# Patient Record
Sex: Female | Born: 1975
Health system: Southern US, Community
[De-identification: ages and names within clinical notes are randomized; demographics above are authoritative.]

## PROBLEM LIST (undated history)

## (undated) DIAGNOSIS — M1990 Unspecified osteoarthritis, unspecified site: Secondary | ICD-10-CM

## (undated) DIAGNOSIS — N289 Disorder of kidney and ureter, unspecified: Secondary | ICD-10-CM

## (undated) DIAGNOSIS — I2510 Atherosclerotic heart disease of native coronary artery without angina pectoris: Secondary | ICD-10-CM

## (undated) DIAGNOSIS — K589 Irritable bowel syndrome without diarrhea: Secondary | ICD-10-CM

## (undated) DIAGNOSIS — Z8739 Personal history of other diseases of the musculoskeletal system and connective tissue: Secondary | ICD-10-CM

## (undated) DIAGNOSIS — M5430 Sciatica, unspecified side: Secondary | ICD-10-CM

## (undated) DIAGNOSIS — M797 Fibromyalgia: Secondary | ICD-10-CM

## (undated) DIAGNOSIS — R42 Dizziness and giddiness: Secondary | ICD-10-CM

## (undated) DIAGNOSIS — K760 Fatty (change of) liver, not elsewhere classified: Secondary | ICD-10-CM

## (undated) HISTORY — PX: TUBAL LIGATION: SHX77

## (undated) HISTORY — DX: Irritable bowel syndrome without diarrhea: K58.9

## (undated) HISTORY — PX: CHOLECYSTECTOMY: SHX55

## (undated) HISTORY — DX: Fatty (change of) liver, not elsewhere classified: K76.0

## (undated) HISTORY — DX: Dizziness and giddiness: R42

## (undated) HISTORY — PX: ABDOMINAL HYSTERECTOMY: SHX81

## (undated) HISTORY — DX: Atherosclerotic heart disease of native coronary artery without angina pectoris: I25.10

---

## 1998-07-01 ENCOUNTER — Emergency Department (HOSPITAL_COMMUNITY): Admission: EM | Admit: 1998-07-01 | Discharge: 1998-07-01 | Payer: Self-pay | Admitting: Emergency Medicine

## 1998-07-17 ENCOUNTER — Emergency Department (HOSPITAL_COMMUNITY): Admission: EM | Admit: 1998-07-17 | Discharge: 1998-07-17 | Payer: Self-pay | Admitting: Emergency Medicine

## 1998-08-02 ENCOUNTER — Emergency Department (HOSPITAL_COMMUNITY): Admission: EM | Admit: 1998-08-02 | Discharge: 1998-08-02 | Payer: Self-pay | Admitting: Emergency Medicine

## 1998-10-04 ENCOUNTER — Emergency Department (HOSPITAL_COMMUNITY): Admission: EM | Admit: 1998-10-04 | Discharge: 1998-10-04 | Payer: Self-pay | Admitting: Emergency Medicine

## 1998-10-09 ENCOUNTER — Emergency Department (HOSPITAL_COMMUNITY): Admission: EM | Admit: 1998-10-09 | Discharge: 1998-10-09 | Payer: Self-pay | Admitting: Emergency Medicine

## 1998-10-22 ENCOUNTER — Other Ambulatory Visit: Admission: RE | Admit: 1998-10-22 | Discharge: 1998-10-22 | Payer: Self-pay | Admitting: Gastroenterology

## 1998-10-25 ENCOUNTER — Ambulatory Visit (HOSPITAL_COMMUNITY): Admission: RE | Admit: 1998-10-25 | Discharge: 1998-10-25 | Payer: Self-pay | Admitting: Gastroenterology

## 1998-10-25 ENCOUNTER — Encounter: Payer: Self-pay | Admitting: Gastroenterology

## 1999-01-05 ENCOUNTER — Emergency Department (HOSPITAL_COMMUNITY): Admission: EM | Admit: 1999-01-05 | Discharge: 1999-01-05 | Payer: Self-pay | Admitting: Emergency Medicine

## 1999-01-13 ENCOUNTER — Other Ambulatory Visit: Admission: RE | Admit: 1999-01-13 | Discharge: 1999-01-13 | Payer: Self-pay | Admitting: Obstetrics

## 1999-03-04 ENCOUNTER — Emergency Department (HOSPITAL_COMMUNITY): Admission: EM | Admit: 1999-03-04 | Discharge: 1999-03-04 | Payer: Self-pay | Admitting: Emergency Medicine

## 1999-04-22 ENCOUNTER — Inpatient Hospital Stay (HOSPITAL_COMMUNITY): Admission: AD | Admit: 1999-04-22 | Discharge: 1999-04-22 | Payer: Self-pay | Admitting: Obstetrics

## 1999-07-14 ENCOUNTER — Inpatient Hospital Stay (HOSPITAL_COMMUNITY): Admission: AD | Admit: 1999-07-14 | Discharge: 1999-07-14 | Payer: Self-pay | Admitting: Obstetrics

## 1999-07-15 ENCOUNTER — Inpatient Hospital Stay (HOSPITAL_COMMUNITY): Admission: AD | Admit: 1999-07-15 | Discharge: 1999-07-15 | Payer: Self-pay | Admitting: Obstetrics

## 1999-08-02 ENCOUNTER — Inpatient Hospital Stay (HOSPITAL_COMMUNITY): Admission: AD | Admit: 1999-08-02 | Discharge: 1999-08-02 | Payer: Self-pay | Admitting: Obstetrics

## 1999-08-11 ENCOUNTER — Inpatient Hospital Stay (HOSPITAL_COMMUNITY): Admission: AD | Admit: 1999-08-11 | Discharge: 1999-08-11 | Payer: Self-pay | Admitting: Obstetrics

## 1999-08-16 ENCOUNTER — Inpatient Hospital Stay (HOSPITAL_COMMUNITY): Admission: AD | Admit: 1999-08-16 | Discharge: 1999-08-16 | Payer: Self-pay | Admitting: Obstetrics

## 1999-08-17 ENCOUNTER — Inpatient Hospital Stay (HOSPITAL_COMMUNITY): Admission: AD | Admit: 1999-08-17 | Discharge: 1999-08-17 | Payer: Self-pay | Admitting: Obstetrics

## 1999-08-19 ENCOUNTER — Inpatient Hospital Stay (HOSPITAL_COMMUNITY): Admission: AD | Admit: 1999-08-19 | Discharge: 1999-08-19 | Payer: Self-pay | Admitting: Obstetrics

## 1999-08-21 ENCOUNTER — Inpatient Hospital Stay (HOSPITAL_COMMUNITY): Admission: AD | Admit: 1999-08-21 | Discharge: 1999-08-23 | Payer: Self-pay | Admitting: Obstetrics

## 1999-11-15 ENCOUNTER — Emergency Department (HOSPITAL_COMMUNITY): Admission: EM | Admit: 1999-11-15 | Discharge: 1999-11-15 | Payer: Self-pay | Admitting: Emergency Medicine

## 1999-11-17 ENCOUNTER — Emergency Department (HOSPITAL_COMMUNITY): Admission: EM | Admit: 1999-11-17 | Discharge: 1999-11-17 | Payer: Self-pay | Admitting: *Deleted

## 1999-12-05 ENCOUNTER — Other Ambulatory Visit: Admission: RE | Admit: 1999-12-05 | Discharge: 1999-12-05 | Payer: Self-pay | Admitting: Obstetrics

## 2000-01-04 ENCOUNTER — Ambulatory Visit (HOSPITAL_COMMUNITY): Admission: RE | Admit: 2000-01-04 | Discharge: 2000-01-04 | Payer: Self-pay | Admitting: Obstetrics

## 2000-04-26 ENCOUNTER — Emergency Department (HOSPITAL_COMMUNITY): Admission: EM | Admit: 2000-04-26 | Discharge: 2000-04-26 | Payer: Self-pay | Admitting: Emergency Medicine

## 2000-05-09 ENCOUNTER — Encounter: Admission: RE | Admit: 2000-05-09 | Discharge: 2000-05-09 | Payer: Self-pay | Admitting: Family Medicine

## 2000-05-09 ENCOUNTER — Encounter: Payer: Self-pay | Admitting: Family Medicine

## 2000-06-28 ENCOUNTER — Encounter (INDEPENDENT_AMBULATORY_CARE_PROVIDER_SITE_OTHER): Payer: Self-pay | Admitting: Specialist

## 2000-06-28 ENCOUNTER — Ambulatory Visit (HOSPITAL_COMMUNITY): Admission: RE | Admit: 2000-06-28 | Discharge: 2000-06-28 | Payer: Self-pay | Admitting: *Deleted

## 2000-11-16 ENCOUNTER — Emergency Department (HOSPITAL_COMMUNITY): Admission: EM | Admit: 2000-11-16 | Discharge: 2000-11-16 | Payer: Self-pay | Admitting: Emergency Medicine

## 2000-11-29 ENCOUNTER — Emergency Department (HOSPITAL_COMMUNITY): Admission: EM | Admit: 2000-11-29 | Discharge: 2000-11-30 | Payer: Self-pay | Admitting: *Deleted

## 2001-01-01 ENCOUNTER — Emergency Department (HOSPITAL_COMMUNITY): Admission: EM | Admit: 2001-01-01 | Discharge: 2001-01-01 | Payer: Self-pay | Admitting: Emergency Medicine

## 2001-02-14 ENCOUNTER — Emergency Department (HOSPITAL_COMMUNITY): Admission: EM | Admit: 2001-02-14 | Discharge: 2001-02-15 | Payer: Self-pay | Admitting: Emergency Medicine

## 2002-01-24 ENCOUNTER — Encounter: Payer: Self-pay | Admitting: Emergency Medicine

## 2002-01-24 ENCOUNTER — Emergency Department (HOSPITAL_COMMUNITY): Admission: EM | Admit: 2002-01-24 | Discharge: 2002-01-24 | Payer: Self-pay | Admitting: Emergency Medicine

## 2002-01-31 ENCOUNTER — Inpatient Hospital Stay (HOSPITAL_COMMUNITY): Admission: AD | Admit: 2002-01-31 | Discharge: 2002-01-31 | Payer: Self-pay | Admitting: *Deleted

## 2002-02-04 ENCOUNTER — Other Ambulatory Visit: Admission: RE | Admit: 2002-02-04 | Discharge: 2002-02-04 | Payer: Self-pay | Admitting: Obstetrics and Gynecology

## 2002-02-04 ENCOUNTER — Encounter: Admission: RE | Admit: 2002-02-04 | Discharge: 2002-02-04 | Payer: Self-pay | Admitting: *Deleted

## 2002-03-11 ENCOUNTER — Emergency Department (HOSPITAL_COMMUNITY): Admission: EM | Admit: 2002-03-11 | Discharge: 2002-03-11 | Payer: Self-pay | Admitting: Emergency Medicine

## 2002-03-20 ENCOUNTER — Emergency Department (HOSPITAL_COMMUNITY): Admission: EM | Admit: 2002-03-20 | Discharge: 2002-03-20 | Payer: Self-pay | Admitting: Emergency Medicine

## 2002-04-20 ENCOUNTER — Emergency Department (HOSPITAL_COMMUNITY): Admission: EM | Admit: 2002-04-20 | Discharge: 2002-04-20 | Payer: Self-pay | Admitting: Emergency Medicine

## 2002-04-20 ENCOUNTER — Encounter: Payer: Self-pay | Admitting: Emergency Medicine

## 2002-04-25 ENCOUNTER — Encounter: Payer: Self-pay | Admitting: Emergency Medicine

## 2002-04-25 ENCOUNTER — Emergency Department (HOSPITAL_COMMUNITY): Admission: EM | Admit: 2002-04-25 | Discharge: 2002-04-26 | Payer: Self-pay | Admitting: Emergency Medicine

## 2002-04-29 ENCOUNTER — Encounter: Admission: RE | Admit: 2002-04-29 | Discharge: 2002-04-29 | Payer: Self-pay | Admitting: *Deleted

## 2002-05-09 ENCOUNTER — Ambulatory Visit (HOSPITAL_COMMUNITY): Admission: RE | Admit: 2002-05-09 | Discharge: 2002-05-09 | Payer: Self-pay | Admitting: General Surgery

## 2002-05-09 ENCOUNTER — Encounter (HOSPITAL_BASED_OUTPATIENT_CLINIC_OR_DEPARTMENT_OTHER): Payer: Self-pay | Admitting: General Surgery

## 2002-05-19 ENCOUNTER — Encounter (HOSPITAL_BASED_OUTPATIENT_CLINIC_OR_DEPARTMENT_OTHER): Payer: Self-pay | Admitting: General Surgery

## 2002-05-19 ENCOUNTER — Ambulatory Visit (HOSPITAL_COMMUNITY): Admission: RE | Admit: 2002-05-19 | Discharge: 2002-05-19 | Payer: Self-pay | Admitting: General Surgery

## 2002-05-21 ENCOUNTER — Ambulatory Visit (HOSPITAL_COMMUNITY): Admission: RE | Admit: 2002-05-21 | Discharge: 2002-05-21 | Payer: Self-pay | Admitting: General Surgery

## 2002-05-21 ENCOUNTER — Encounter (HOSPITAL_BASED_OUTPATIENT_CLINIC_OR_DEPARTMENT_OTHER): Payer: Self-pay | Admitting: General Surgery

## 2002-06-26 ENCOUNTER — Emergency Department (HOSPITAL_COMMUNITY): Admission: EM | Admit: 2002-06-26 | Discharge: 2002-06-26 | Payer: Self-pay

## 2002-07-11 ENCOUNTER — Inpatient Hospital Stay (HOSPITAL_COMMUNITY): Admission: AD | Admit: 2002-07-11 | Discharge: 2002-07-11 | Payer: Self-pay | Admitting: *Deleted

## 2002-07-11 ENCOUNTER — Encounter: Payer: Self-pay | Admitting: *Deleted

## 2002-07-17 ENCOUNTER — Encounter: Admission: RE | Admit: 2002-07-17 | Discharge: 2002-07-17 | Payer: Self-pay | Admitting: Obstetrics and Gynecology

## 2002-07-21 ENCOUNTER — Ambulatory Visit (HOSPITAL_COMMUNITY): Admission: RE | Admit: 2002-07-21 | Discharge: 2002-07-21 | Payer: Self-pay | Admitting: Obstetrics and Gynecology

## 2002-07-21 ENCOUNTER — Encounter: Payer: Self-pay | Admitting: Obstetrics and Gynecology

## 2003-10-10 DIAGNOSIS — K589 Irritable bowel syndrome without diarrhea: Secondary | ICD-10-CM

## 2003-10-10 HISTORY — DX: Irritable bowel syndrome, unspecified: K58.9

## 2004-06-08 ENCOUNTER — Inpatient Hospital Stay (HOSPITAL_COMMUNITY): Admission: AD | Admit: 2004-06-08 | Discharge: 2004-06-08 | Payer: Self-pay | Admitting: Obstetrics and Gynecology

## 2004-06-10 ENCOUNTER — Inpatient Hospital Stay (HOSPITAL_COMMUNITY): Admission: AD | Admit: 2004-06-10 | Discharge: 2004-06-10 | Payer: Self-pay | Admitting: Family Medicine

## 2004-06-29 ENCOUNTER — Emergency Department (HOSPITAL_COMMUNITY): Admission: EM | Admit: 2004-06-29 | Discharge: 2004-06-29 | Payer: Self-pay | Admitting: *Deleted

## 2004-10-25 ENCOUNTER — Emergency Department (HOSPITAL_COMMUNITY): Admission: EM | Admit: 2004-10-25 | Discharge: 2004-10-25 | Payer: Self-pay | Admitting: Emergency Medicine

## 2005-02-12 ENCOUNTER — Emergency Department (HOSPITAL_COMMUNITY): Admission: EM | Admit: 2005-02-12 | Discharge: 2005-02-12 | Payer: Self-pay | Admitting: Emergency Medicine

## 2005-02-20 ENCOUNTER — Inpatient Hospital Stay (HOSPITAL_COMMUNITY): Admission: AD | Admit: 2005-02-20 | Discharge: 2005-02-20 | Payer: Self-pay | Admitting: *Deleted

## 2005-04-06 ENCOUNTER — Emergency Department (HOSPITAL_COMMUNITY): Admission: EM | Admit: 2005-04-06 | Discharge: 2005-04-06 | Payer: Self-pay | Admitting: Emergency Medicine

## 2005-04-26 ENCOUNTER — Encounter (INDEPENDENT_AMBULATORY_CARE_PROVIDER_SITE_OTHER): Payer: Self-pay | Admitting: *Deleted

## 2005-04-26 ENCOUNTER — Ambulatory Visit (HOSPITAL_COMMUNITY): Admission: RE | Admit: 2005-04-26 | Discharge: 2005-04-26 | Payer: Self-pay | Admitting: Obstetrics

## 2005-06-06 ENCOUNTER — Emergency Department (HOSPITAL_COMMUNITY): Admission: EM | Admit: 2005-06-06 | Discharge: 2005-06-06 | Payer: Self-pay | Admitting: *Deleted

## 2005-07-17 ENCOUNTER — Emergency Department (HOSPITAL_COMMUNITY): Admission: EM | Admit: 2005-07-17 | Discharge: 2005-07-18 | Payer: Self-pay | Admitting: Emergency Medicine

## 2005-08-15 ENCOUNTER — Emergency Department (HOSPITAL_COMMUNITY): Admission: EM | Admit: 2005-08-15 | Discharge: 2005-08-15 | Payer: Self-pay | Admitting: Emergency Medicine

## 2005-08-25 ENCOUNTER — Encounter: Admission: RE | Admit: 2005-08-25 | Discharge: 2005-09-21 | Payer: Self-pay | Admitting: Orthopedic Surgery

## 2005-09-02 ENCOUNTER — Emergency Department (HOSPITAL_COMMUNITY): Admission: EM | Admit: 2005-09-02 | Discharge: 2005-09-02 | Payer: Self-pay | Admitting: Emergency Medicine

## 2006-01-15 ENCOUNTER — Emergency Department (HOSPITAL_COMMUNITY): Admission: EM | Admit: 2006-01-15 | Discharge: 2006-01-15 | Payer: Self-pay | Admitting: Family Medicine

## 2006-01-23 ENCOUNTER — Emergency Department (HOSPITAL_COMMUNITY): Admission: EM | Admit: 2006-01-23 | Discharge: 2006-01-23 | Payer: Self-pay | Admitting: Family Medicine

## 2006-05-05 ENCOUNTER — Emergency Department (HOSPITAL_COMMUNITY): Admission: EM | Admit: 2006-05-05 | Discharge: 2006-05-05 | Payer: Self-pay | Admitting: Emergency Medicine

## 2006-05-20 ENCOUNTER — Emergency Department (HOSPITAL_COMMUNITY): Admission: EM | Admit: 2006-05-20 | Discharge: 2006-05-21 | Payer: Self-pay | Admitting: Emergency Medicine

## 2006-08-16 ENCOUNTER — Emergency Department (HOSPITAL_COMMUNITY): Admission: EM | Admit: 2006-08-16 | Discharge: 2006-08-16 | Payer: Self-pay | Admitting: Family Medicine

## 2006-10-20 ENCOUNTER — Emergency Department: Payer: Self-pay | Admitting: Emergency Medicine

## 2006-11-04 ENCOUNTER — Emergency Department (HOSPITAL_COMMUNITY): Admission: EM | Admit: 2006-11-04 | Discharge: 2006-11-05 | Payer: Self-pay | Admitting: Emergency Medicine

## 2007-01-16 ENCOUNTER — Emergency Department (HOSPITAL_COMMUNITY): Admission: EM | Admit: 2007-01-16 | Discharge: 2007-01-16 | Payer: Self-pay | Admitting: Emergency Medicine

## 2007-02-05 ENCOUNTER — Emergency Department (HOSPITAL_COMMUNITY): Admission: EM | Admit: 2007-02-05 | Discharge: 2007-02-05 | Payer: Self-pay | Admitting: Emergency Medicine

## 2007-03-18 ENCOUNTER — Encounter: Admission: RE | Admit: 2007-03-18 | Discharge: 2007-04-10 | Payer: Self-pay | Admitting: Family Medicine

## 2007-03-19 ENCOUNTER — Encounter: Admission: RE | Admit: 2007-03-19 | Discharge: 2007-03-19 | Payer: Self-pay | Admitting: Family Medicine

## 2007-07-11 ENCOUNTER — Emergency Department (HOSPITAL_COMMUNITY): Admission: EM | Admit: 2007-07-11 | Discharge: 2007-07-11 | Payer: Self-pay | Admitting: Emergency Medicine

## 2007-08-02 ENCOUNTER — Emergency Department (HOSPITAL_COMMUNITY): Admission: EM | Admit: 2007-08-02 | Discharge: 2007-08-02 | Payer: Self-pay | Admitting: Emergency Medicine

## 2007-09-05 ENCOUNTER — Emergency Department (HOSPITAL_COMMUNITY): Admission: EM | Admit: 2007-09-05 | Discharge: 2007-09-05 | Payer: Self-pay | Admitting: Emergency Medicine

## 2007-10-25 ENCOUNTER — Emergency Department (HOSPITAL_COMMUNITY): Admission: EM | Admit: 2007-10-25 | Discharge: 2007-10-25 | Payer: Self-pay | Admitting: Emergency Medicine

## 2007-11-19 ENCOUNTER — Emergency Department (HOSPITAL_COMMUNITY): Admission: EM | Admit: 2007-11-19 | Discharge: 2007-11-19 | Payer: Self-pay | Admitting: Emergency Medicine

## 2007-12-18 ENCOUNTER — Emergency Department (HOSPITAL_COMMUNITY): Admission: EM | Admit: 2007-12-18 | Discharge: 2007-12-18 | Payer: Self-pay | Admitting: Emergency Medicine

## 2008-03-18 ENCOUNTER — Emergency Department (HOSPITAL_COMMUNITY): Admission: EM | Admit: 2008-03-18 | Discharge: 2008-03-18 | Payer: Self-pay | Admitting: Emergency Medicine

## 2008-04-01 ENCOUNTER — Emergency Department (HOSPITAL_COMMUNITY): Admission: EM | Admit: 2008-04-01 | Discharge: 2008-04-01 | Payer: Self-pay | Admitting: Emergency Medicine

## 2008-04-11 ENCOUNTER — Emergency Department (HOSPITAL_COMMUNITY): Admission: EM | Admit: 2008-04-11 | Discharge: 2008-04-11 | Payer: Self-pay | Admitting: Emergency Medicine

## 2008-05-30 ENCOUNTER — Emergency Department (HOSPITAL_COMMUNITY): Admission: EM | Admit: 2008-05-30 | Discharge: 2008-05-30 | Payer: Self-pay | Admitting: Emergency Medicine

## 2008-06-26 ENCOUNTER — Emergency Department (HOSPITAL_COMMUNITY): Admission: EM | Admit: 2008-06-26 | Discharge: 2008-06-26 | Payer: Self-pay | Admitting: Family Medicine

## 2008-07-01 ENCOUNTER — Emergency Department (HOSPITAL_COMMUNITY): Admission: EM | Admit: 2008-07-01 | Discharge: 2008-07-01 | Payer: Self-pay | Admitting: Emergency Medicine

## 2008-07-03 ENCOUNTER — Emergency Department (HOSPITAL_COMMUNITY): Admission: EM | Admit: 2008-07-03 | Discharge: 2008-07-03 | Payer: Self-pay | Admitting: Emergency Medicine

## 2008-07-16 ENCOUNTER — Emergency Department (HOSPITAL_COMMUNITY): Admission: EM | Admit: 2008-07-16 | Discharge: 2008-07-17 | Payer: Self-pay | Admitting: *Deleted

## 2008-08-30 ENCOUNTER — Inpatient Hospital Stay (HOSPITAL_COMMUNITY): Admission: AD | Admit: 2008-08-30 | Discharge: 2008-08-30 | Payer: Self-pay | Admitting: Obstetrics & Gynecology

## 2008-09-17 ENCOUNTER — Emergency Department (HOSPITAL_COMMUNITY): Admission: EM | Admit: 2008-09-17 | Discharge: 2008-09-17 | Payer: Self-pay | Admitting: Emergency Medicine

## 2008-10-10 ENCOUNTER — Emergency Department (HOSPITAL_COMMUNITY): Admission: EM | Admit: 2008-10-10 | Discharge: 2008-10-10 | Payer: Self-pay | Admitting: Emergency Medicine

## 2009-01-27 ENCOUNTER — Emergency Department (HOSPITAL_COMMUNITY): Admission: EM | Admit: 2009-01-27 | Discharge: 2009-01-27 | Payer: Self-pay | Admitting: Emergency Medicine

## 2009-01-28 ENCOUNTER — Observation Stay (HOSPITAL_COMMUNITY): Admission: EM | Admit: 2009-01-28 | Discharge: 2009-01-28 | Payer: Self-pay | Admitting: Emergency Medicine

## 2009-03-21 ENCOUNTER — Emergency Department (HOSPITAL_BASED_OUTPATIENT_CLINIC_OR_DEPARTMENT_OTHER): Admission: EM | Admit: 2009-03-21 | Discharge: 2009-03-21 | Payer: Self-pay | Admitting: Emergency Medicine

## 2009-03-21 ENCOUNTER — Ambulatory Visit: Payer: Self-pay | Admitting: Diagnostic Radiology

## 2009-04-17 ENCOUNTER — Emergency Department (HOSPITAL_COMMUNITY): Admission: EM | Admit: 2009-04-17 | Discharge: 2009-04-17 | Payer: Self-pay | Admitting: Emergency Medicine

## 2009-04-18 ENCOUNTER — Emergency Department (HOSPITAL_COMMUNITY): Admission: EM | Admit: 2009-04-18 | Discharge: 2009-04-19 | Payer: Self-pay | Admitting: Emergency Medicine

## 2009-04-21 ENCOUNTER — Emergency Department (HOSPITAL_COMMUNITY): Admission: EM | Admit: 2009-04-21 | Discharge: 2009-04-21 | Payer: Self-pay | Admitting: Emergency Medicine

## 2009-04-22 ENCOUNTER — Emergency Department (HOSPITAL_COMMUNITY): Admission: EM | Admit: 2009-04-22 | Discharge: 2009-04-22 | Payer: Self-pay | Admitting: Emergency Medicine

## 2009-04-25 ENCOUNTER — Other Ambulatory Visit: Payer: Self-pay | Admitting: Emergency Medicine

## 2009-04-26 ENCOUNTER — Ambulatory Visit: Payer: Self-pay | Admitting: *Deleted

## 2009-04-26 ENCOUNTER — Inpatient Hospital Stay (HOSPITAL_COMMUNITY): Admission: AD | Admit: 2009-04-26 | Discharge: 2009-04-28 | Payer: Self-pay | Admitting: *Deleted

## 2009-05-01 ENCOUNTER — Emergency Department (HOSPITAL_COMMUNITY): Admission: EM | Admit: 2009-05-01 | Discharge: 2009-05-02 | Payer: Self-pay | Admitting: Emergency Medicine

## 2009-05-03 ENCOUNTER — Emergency Department (HOSPITAL_COMMUNITY): Admission: EM | Admit: 2009-05-03 | Discharge: 2009-05-03 | Payer: Self-pay | Admitting: Emergency Medicine

## 2009-05-26 ENCOUNTER — Emergency Department (HOSPITAL_COMMUNITY): Admission: EM | Admit: 2009-05-26 | Discharge: 2009-05-26 | Payer: Self-pay | Admitting: Emergency Medicine

## 2009-06-03 ENCOUNTER — Emergency Department (HOSPITAL_COMMUNITY): Admission: EM | Admit: 2009-06-03 | Discharge: 2009-06-04 | Payer: Self-pay | Admitting: Emergency Medicine

## 2009-06-08 ENCOUNTER — Emergency Department (HOSPITAL_COMMUNITY): Admission: EM | Admit: 2009-06-08 | Discharge: 2009-06-08 | Payer: Self-pay | Admitting: Emergency Medicine

## 2009-06-14 ENCOUNTER — Inpatient Hospital Stay (HOSPITAL_COMMUNITY): Admission: AD | Admit: 2009-06-14 | Discharge: 2009-06-14 | Payer: Self-pay | Admitting: Obstetrics & Gynecology

## 2009-08-20 ENCOUNTER — Emergency Department (HOSPITAL_COMMUNITY): Admission: EM | Admit: 2009-08-20 | Discharge: 2009-08-20 | Payer: Self-pay | Admitting: Emergency Medicine

## 2009-10-03 ENCOUNTER — Emergency Department (HOSPITAL_COMMUNITY): Admission: EM | Admit: 2009-10-03 | Discharge: 2009-10-03 | Payer: Self-pay | Admitting: Emergency Medicine

## 2009-10-06 ENCOUNTER — Emergency Department (HOSPITAL_COMMUNITY): Admission: EM | Admit: 2009-10-06 | Discharge: 2009-10-06 | Payer: Self-pay | Admitting: Emergency Medicine

## 2009-10-09 DIAGNOSIS — R42 Dizziness and giddiness: Secondary | ICD-10-CM

## 2009-10-09 HISTORY — DX: Dizziness and giddiness: R42

## 2009-10-14 ENCOUNTER — Emergency Department (HOSPITAL_COMMUNITY): Admission: EM | Admit: 2009-10-14 | Discharge: 2009-10-15 | Payer: Self-pay | Admitting: Emergency Medicine

## 2009-10-25 ENCOUNTER — Emergency Department (HOSPITAL_COMMUNITY): Admission: EM | Admit: 2009-10-25 | Discharge: 2009-10-25 | Payer: Self-pay | Admitting: Emergency Medicine

## 2010-01-06 ENCOUNTER — Emergency Department (HOSPITAL_COMMUNITY): Admission: EM | Admit: 2010-01-06 | Discharge: 2010-01-06 | Payer: Self-pay | Admitting: Emergency Medicine

## 2010-02-10 ENCOUNTER — Emergency Department (HOSPITAL_COMMUNITY): Admission: EM | Admit: 2010-02-10 | Discharge: 2010-02-11 | Payer: Self-pay | Admitting: Emergency Medicine

## 2010-02-16 ENCOUNTER — Emergency Department (HOSPITAL_COMMUNITY): Admission: EM | Admit: 2010-02-16 | Discharge: 2010-02-17 | Payer: Self-pay | Admitting: Emergency Medicine

## 2010-03-29 ENCOUNTER — Emergency Department (HOSPITAL_COMMUNITY): Admission: EM | Admit: 2010-03-29 | Discharge: 2010-03-30 | Payer: Self-pay | Admitting: Emergency Medicine

## 2010-05-02 ENCOUNTER — Emergency Department (HOSPITAL_COMMUNITY): Admission: EM | Admit: 2010-05-02 | Discharge: 2010-05-03 | Payer: Self-pay | Admitting: Emergency Medicine

## 2010-05-12 ENCOUNTER — Encounter (INDEPENDENT_AMBULATORY_CARE_PROVIDER_SITE_OTHER): Payer: Self-pay | Admitting: Obstetrics and Gynecology

## 2010-05-12 ENCOUNTER — Ambulatory Visit (HOSPITAL_COMMUNITY): Admission: RE | Admit: 2010-05-12 | Discharge: 2010-05-13 | Payer: Self-pay | Admitting: Obstetrics and Gynecology

## 2010-12-23 LAB — CBC
MCH: 32.2 pg (ref 26.0–34.0)
MCHC: 34.3 g/dL (ref 30.0–36.0)
MCV: 93.7 fL (ref 78.0–100.0)
Platelets: 168 10*3/uL (ref 150–400)
RDW: 13.2 % (ref 11.5–15.5)

## 2010-12-24 LAB — URINALYSIS, ROUTINE W REFLEX MICROSCOPIC
Glucose, UA: NEGATIVE mg/dL
Glucose, UA: NEGATIVE mg/dL
Ketones, ur: NEGATIVE mg/dL
Protein, ur: NEGATIVE mg/dL
Protein, ur: 30 mg/dL — AB
Specific Gravity, Urine: 1.029 (ref 1.005–1.030)
Urobilinogen, UA: 0.2 mg/dL (ref 0.0–1.0)
pH: 5.5 (ref 5.0–8.0)

## 2010-12-24 LAB — DIFFERENTIAL
Basophils Absolute: 0 10*3/uL (ref 0.0–0.1)
Basophils Relative: 0 % (ref 0–1)
Lymphocytes Relative: 24 % (ref 12–46)
Monocytes Absolute: 0.6 10*3/uL (ref 0.1–1.0)
Neutro Abs: 8.6 10*3/uL — ABNORMAL HIGH (ref 1.7–7.7)
Neutrophils Relative %: 70 % (ref 43–77)

## 2010-12-24 LAB — URINE MICROSCOPIC-ADD ON

## 2010-12-24 LAB — COMPREHENSIVE METABOLIC PANEL
Albumin: 4.1 g/dL (ref 3.5–5.2)
Alkaline Phosphatase: 65 U/L (ref 39–117)
BUN: 12 mg/dL (ref 6–23)
CO2: 22 mEq/L (ref 19–32)
Chloride: 111 mEq/L (ref 96–112)
Creatinine, Ser: 0.71 mg/dL (ref 0.4–1.2)
GFR calc non Af Amer: >60 mL/min (ref >60)
Glucose, Bld: 96 mg/dL (ref 70–99)
Potassium: 3.7 mEq/L (ref 3.5–5.1)
Total Bilirubin: 0.5 mg/dL (ref 0.3–1.2)

## 2010-12-24 LAB — WET PREP, GENITAL
Trich, Wet Prep: NONE SEEN
Yeast Wet Prep HPF POC: NONE SEEN

## 2010-12-24 LAB — CBC
HCT: 39.3 % (ref 36.0–46.0)
Hemoglobin: 13.7 g/dL (ref 12.0–15.0)
MCH: 32 pg (ref 26.0–34.0)
MCV: 91.8 fL (ref 78.0–100.0)
Platelets: 192 10*3/uL (ref 150–400)
RBC: 4.28 MIL/uL (ref 3.87–5.11)
WBC: 12.3 10*3/uL — ABNORMAL HIGH (ref 4.0–10.5)

## 2010-12-24 LAB — POCT PREGNANCY, URINE: Preg Test, Ur: NEGATIVE

## 2010-12-24 LAB — GC/CHLAMYDIA PROBE AMP, GENITAL: Chlamydia, DNA Probe: NEGATIVE

## 2010-12-25 LAB — POCT PREGNANCY, URINE: Preg Test, Ur: NEGATIVE

## 2010-12-26 LAB — URINALYSIS, ROUTINE W REFLEX MICROSCOPIC
Bilirubin Urine: NEGATIVE
Hgb urine dipstick: NEGATIVE
Ketones, ur: NEGATIVE mg/dL
Specific Gravity, Urine: 1.014 (ref 1.005–1.030)
pH: 6.5 (ref 5.0–8.0)

## 2010-12-26 LAB — DIFFERENTIAL
Basophils Relative: 0 % (ref 0–1)
Eosinophils Absolute: 0.3 10*3/uL (ref 0.0–0.7)
Lymphs Abs: 3.6 10*3/uL (ref 0.7–4.0)
Monocytes Relative: 4 % (ref 3–12)
Neutro Abs: 8.5 10*3/uL — ABNORMAL HIGH (ref 1.7–7.7)
Neutrophils Relative %: 66 % (ref 43–77)

## 2010-12-26 LAB — CBC
MCV: 92.6 fL (ref 78.0–100.0)
Platelets: 199 10*3/uL (ref 150–400)
RBC: 4.43 MIL/uL (ref 3.87–5.11)
WBC: 12.9 10*3/uL — ABNORMAL HIGH (ref 4.0–10.5)

## 2010-12-26 LAB — GC/CHLAMYDIA PROBE AMP, GENITAL: GC Probe Amp, Genital: NEGATIVE

## 2010-12-26 LAB — WET PREP, GENITAL
Clue Cells Wet Prep HPF POC: NONE SEEN
Trich, Wet Prep: NONE SEEN
Yeast Wet Prep HPF POC: NONE SEEN

## 2010-12-27 LAB — URINALYSIS, ROUTINE W REFLEX MICROSCOPIC
Bilirubin Urine: NEGATIVE
Bilirubin Urine: NEGATIVE
Glucose, UA: NEGATIVE mg/dL
Glucose, UA: NEGATIVE mg/dL
Ketones, ur: NEGATIVE mg/dL
Nitrite: NEGATIVE
Protein, ur: NEGATIVE mg/dL
Specific Gravity, Urine: 1.024 (ref 1.005–1.030)
pH: 6.5 (ref 5.0–8.0)

## 2010-12-27 LAB — WET PREP, GENITAL
Clue Cells Wet Prep HPF POC: NONE SEEN
Trich, Wet Prep: NONE SEEN
Trich, Wet Prep: NONE SEEN

## 2010-12-27 LAB — DIFFERENTIAL
Eosinophils Absolute: 0.3 10*3/uL (ref 0.0–0.7)
Eosinophils Relative: 2 % (ref 0–5)
Lymphocytes Relative: 29 % (ref 12–46)
Lymphs Abs: 3.2 10*3/uL (ref 0.7–4.0)
Monocytes Absolute: 0.4 10*3/uL (ref 0.1–1.0)
Monocytes Relative: 4 % (ref 3–12)

## 2010-12-27 LAB — URINE MICROSCOPIC-ADD ON

## 2010-12-27 LAB — CBC
HCT: 37.4 % (ref 36.0–46.0)
Hemoglobin: 13.1 g/dL (ref 12.0–15.0)
MCV: 92.3 fL (ref 78.0–100.0)
Platelets: 201 10*3/uL (ref 150–400)
RBC: 4.05 MIL/uL (ref 3.87–5.11)
WBC: 10.9 10*3/uL — ABNORMAL HIGH (ref 4.0–10.5)

## 2010-12-27 LAB — GC/CHLAMYDIA PROBE AMP, GENITAL
Chlamydia, DNA Probe: NEGATIVE
GC Probe Amp, Genital: NEGATIVE

## 2010-12-27 LAB — POCT PREGNANCY, URINE: Preg Test, Ur: NEGATIVE

## 2011-01-09 LAB — GC/CHLAMYDIA PROBE AMP, GENITAL: Chlamydia, DNA Probe: NEGATIVE

## 2011-01-09 LAB — POCT URINALYSIS DIP (DEVICE)
Bilirubin Urine: NEGATIVE
Nitrite: NEGATIVE
Protein, ur: NEGATIVE mg/dL
pH: 7 (ref 5.0–8.0)

## 2011-01-09 LAB — POCT PREGNANCY, URINE: Preg Test, Ur: NEGATIVE

## 2011-01-13 LAB — URINALYSIS, ROUTINE W REFLEX MICROSCOPIC
Nitrite: NEGATIVE
Specific Gravity, Urine: >1.03 — ABNORMAL HIGH (ref 1.005–1.030)
pH: 5.5 (ref 5.0–8.0)

## 2011-01-13 LAB — URINE MICROSCOPIC-ADD ON

## 2011-01-13 LAB — POCT PREGNANCY, URINE: Preg Test, Ur: NEGATIVE

## 2011-01-14 LAB — URINALYSIS, ROUTINE W REFLEX MICROSCOPIC
Bilirubin Urine: NEGATIVE
Bilirubin Urine: NEGATIVE
Glucose, UA: NEGATIVE mg/dL
Glucose, UA: NEGATIVE mg/dL
Hgb urine dipstick: NEGATIVE
Ketones, ur: 15 mg/dL — AB
Ketones, ur: NEGATIVE mg/dL
Nitrite: NEGATIVE
Nitrite: NEGATIVE
Protein, ur: NEGATIVE mg/dL
Protein, ur: NEGATIVE mg/dL
Specific Gravity, Urine: 1.008 (ref 1.005–1.030)
Specific Gravity, Urine: 1.022 (ref 1.005–1.030)
Urobilinogen, UA: 0.2 mg/dL (ref 0.0–1.0)
Urobilinogen, UA: 0.2 mg/dL (ref 0.0–1.0)
pH: 5.5 (ref 5.0–8.0)
pH: 6 (ref 5.0–8.0)

## 2011-01-14 LAB — URINE MICROSCOPIC-ADD ON

## 2011-01-14 LAB — PREGNANCY, URINE: Preg Test, Ur: NEGATIVE

## 2011-01-14 LAB — URINE CULTURE: Colony Count: 4000

## 2011-01-14 LAB — HCG, QUANTITATIVE, PREGNANCY: hCG, Beta Chain, Quant, S: 1 m[IU]/mL (ref <5)

## 2011-01-14 LAB — POCT PREGNANCY, URINE: Preg Test, Ur: NEGATIVE

## 2011-01-14 LAB — GC/CHLAMYDIA PROBE AMP, GENITAL: GC Probe Amp, Genital: NEGATIVE

## 2011-01-14 LAB — WET PREP, GENITAL
Trich, Wet Prep: NONE SEEN
Yeast Wet Prep HPF POC: NONE SEEN

## 2011-01-15 LAB — COMPREHENSIVE METABOLIC PANEL
AST: 15 U/L (ref 0–37)
Albumin: 3.9 g/dL (ref 3.5–5.2)
BUN: 8 mg/dL (ref 6–23)
Calcium: 9.4 mg/dL (ref 8.4–10.5)
Chloride: 105 mEq/L (ref 96–112)
Creatinine, Ser: 0.73 mg/dL (ref 0.4–1.2)
GFR calc Af Amer: >60 mL/min (ref >60)
Total Bilirubin: 0.9 mg/dL (ref 0.3–1.2)
Total Protein: 7.2 g/dL (ref 6.0–8.3)

## 2011-01-15 LAB — RAPID URINE DRUG SCREEN, HOSP PERFORMED
Amphetamines: NOT DETECTED
Benzodiazepines: NOT DETECTED
Cocaine: NOT DETECTED
Opiates: NOT DETECTED
Tetrahydrocannabinol: NOT DETECTED

## 2011-01-15 LAB — GLUCOSE, CAPILLARY: Glucose-Capillary: 88 mg/dL (ref 70–99)

## 2011-01-15 LAB — CBC
HCT: 39 % (ref 36.0–46.0)
MCV: 89.5 fL (ref 78.0–100.0)
Platelets: 183 10*3/uL (ref 150–400)
RDW: 13 % (ref 11.5–15.5)
WBC: 7.4 10*3/uL (ref 4.0–10.5)

## 2011-01-15 LAB — ACETAMINOPHEN LEVEL: Acetaminophen (Tylenol), Serum: <10 ug/mL — ABNORMAL LOW (ref 10–30)

## 2011-01-15 LAB — ETHANOL: Alcohol, Ethyl (B): <5 mg/dL (ref 0–10)

## 2011-01-16 LAB — URINALYSIS, ROUTINE W REFLEX MICROSCOPIC
Bilirubin Urine: NEGATIVE
Hgb urine dipstick: NEGATIVE
Ketones, ur: NEGATIVE mg/dL
Nitrite: NEGATIVE
Specific Gravity, Urine: 1.007 (ref 1.005–1.030)
Urobilinogen, UA: 0.2 mg/dL (ref 0.0–1.0)

## 2011-01-18 LAB — DIFFERENTIAL
Basophils Absolute: 0.1 10*3/uL (ref 0.0–0.1)
Basophils Relative: 1 % (ref 0–1)
Lymphocytes Relative: 19 % (ref 12–46)
Monocytes Absolute: 0.7 10*3/uL (ref 0.1–1.0)
Neutro Abs: 10.6 10*3/uL — ABNORMAL HIGH (ref 1.7–7.7)
Neutrophils Relative %: 71 % (ref 43–77)

## 2011-01-18 LAB — URINALYSIS, ROUTINE W REFLEX MICROSCOPIC
Bilirubin Urine: NEGATIVE
Bilirubin Urine: NEGATIVE
Glucose, UA: NEGATIVE mg/dL
Glucose, UA: NEGATIVE mg/dL
Ketones, ur: NEGATIVE mg/dL
Ketones, ur: NEGATIVE mg/dL
Leukocytes, UA: NEGATIVE
Nitrite: NEGATIVE
Nitrite: POSITIVE — AB
Protein, ur: >300 mg/dL — AB
Protein, ur: NEGATIVE mg/dL
Specific Gravity, Urine: 1.008 (ref 1.005–1.030)
Specific Gravity, Urine: 1.014 (ref 1.005–1.030)
Urobilinogen, UA: 0.2 mg/dL (ref 0.0–1.0)
Urobilinogen, UA: 0.2 mg/dL (ref 0.0–1.0)
pH: 5.5 (ref 5.0–8.0)
pH: 6 (ref 5.0–8.0)

## 2011-01-18 LAB — CBC
HCT: 40 % (ref 36.0–46.0)
Hemoglobin: 14.1 g/dL (ref 12.0–15.0)
MCV: 91.8 fL (ref 78.0–100.0)
WBC: 14.9 10*3/uL — ABNORMAL HIGH (ref 4.0–10.5)

## 2011-01-18 LAB — COMPREHENSIVE METABOLIC PANEL
Alkaline Phosphatase: 77 U/L (ref 39–117)
BUN: 15 mg/dL (ref 6–23)
Chloride: 107 mEq/L (ref 96–112)
Creatinine, Ser: 1.47 mg/dL — ABNORMAL HIGH (ref 0.4–1.2)
GFR calc non Af Amer: 42 mL/min — ABNORMAL LOW (ref >60)
Glucose, Bld: 129 mg/dL — ABNORMAL HIGH (ref 70–99)
Potassium: 4.1 mEq/L (ref 3.5–5.1)
Total Bilirubin: 0.7 mg/dL (ref 0.3–1.2)

## 2011-01-18 LAB — WET PREP, GENITAL
Trich, Wet Prep: NONE SEEN
Yeast Wet Prep HPF POC: NONE SEEN

## 2011-01-18 LAB — POCT I-STAT, CHEM 8
BUN: 16 mg/dL (ref 6–23)
Calcium, Ion: 1.09 mmol/L — ABNORMAL LOW (ref 1.12–1.32)
Chloride: 108 mEq/L (ref 96–112)
Creatinine, Ser: 1.2 mg/dL (ref 0.4–1.2)
Glucose, Bld: 100 mg/dL — ABNORMAL HIGH (ref 70–99)
HCT: 38 % (ref 36.0–46.0)
Hemoglobin: 12.9 g/dL (ref 12.0–15.0)
Potassium: 4 mEq/L (ref 3.5–5.1)
Sodium: 139 mEq/L (ref 135–145)
TCO2: 23 mmol/L (ref 0–100)

## 2011-01-18 LAB — URINE MICROSCOPIC-ADD ON

## 2011-01-18 LAB — LIPASE, BLOOD: Lipase: 23 U/L (ref 11–59)

## 2011-01-18 LAB — GC/CHLAMYDIA PROBE AMP, GENITAL
Chlamydia, DNA Probe: NEGATIVE
GC Probe Amp, Genital: NEGATIVE

## 2011-01-18 LAB — URINE CULTURE

## 2011-01-18 LAB — POCT PREGNANCY, URINE: Preg Test, Ur: NEGATIVE
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
## 2011-02-21 NOTE — Discharge Summary (Signed)
NAME:  Kathy Conway, Kathy Conway NO.:  [id number]   MEDICAL RECORD NO.:  [id number]          PATIENT TYPE:  IPS   LOCATION:  0300                          [facility]ACILITY[facility]  [facility]   PHYSICIAN:  Jasmine Pang, M.D. DATE OF BIRTH:  1975-11-24   DATE OF ADMISSION:  04/26/2009  DATE OF DISCHARGE:  04/28/2009                               DISCHARGE SUMMARY   IDENTIFICATION:  This is a 35 year old single white female who was  admitted on a voluntary basis on April 26, 2009.   HISTORY OF PRESENT ILLNESS:  The patient presents with an overdose on  Vistaril, unknown amount of tablets.  She states her intention was to  die.  She reports being overwhelmed, having multiple stressors with her  housing and finances.  She states that everything was taken a toll on  her and she felt hopeless.  Her children are currently in foster care.  She has not been sleeping well.  Her appetite has been decreased.  She  is currently denied any suicidal thoughts.  She denies any  hallucinations and denies any alcohol or substance use.  This is the  first admission to [facility].  She was on the ACT team.  She sees Dr. Gerda Diss and has a Surveyor, minerals.  She reports she has  been treated for bipolar disorder and PTSD.  For further admission  information, see psychiatric admission assessment.  Initially, she was  given an axis I diagnosis of mood disorder, NOS and an axis III  diagnosis of fibromyalgia.   HOSPITAL COURSE:  Upon admission, the patient was restarted on her home  medications of Seroquel 50 mg in the day and 200 mg at h.s., Cymbalta 90  mg daily, and Cortisporin ear drops 4 drops to left ear q.i.d.  She was  also started on Ambien 5 mg p.o. q.h.s. p.r.n. insomnia.  In addition  due to her ear pain, she was started on hydrocodone 5/325 mg 1 every 6  hours p.r.n. ear pain.  In individual sessions, the patient was casually  dressed.  She had good eye contact.  Speech was normal rate and  flow.  She was alert and oriented x4.  She looked depressed and anxious.  Affect consistent with mood.  There was no evidence of psychosis or  thought disorder.  The patient did not feel she needed to be in the  hospital for very long, she wanted to go home the following day.  She  talked about her numerous stressors.  She lives in a motel now.  She  used to drink socially and used cocaine.  She was seen by the [facility] ACT teams.  She is on Cymbalta, hydroxyzine, and Seroquel  prescribed by their doctor.  She has fibromyalgia and nerve problems.  On April 28, 2009, sleep was good and appetite was good.  Mood was  euthymic.  Affect consistent with mood.  There was no suicidal or  homicidal ideation.  No paranoia or delusions.  No auditory or visual  hallucinations.  Thoughts were logical and goal-directed.  Thought  content, no predominant theme.  She was not having side effects with her  medications.  No changes were made in her medications as she felt this  had been helping fairly well.  She has felt her overdose was an  impulsive gesture, and she would not do again.  She wanted to go home  today and was felt to be safe for discharge.   DISCHARGE DIAGNOSES:  Axis I:  Mood disorder, not otherwise specified.  Axis II:  None.  Axis III:  Fibromyalgia.  Axis IV:  Severe (problems with housing and economic issues, burden of  psychiatric illness, burden of medical problems).  Axis V:  Global assessment of functioning was 50 upon discharge.  Global  assessment of functioning was 40 upon admission.  Global assessment of  functioning highest past year was 65.   DISCHARGE PLAN:  There was no specific activity level or dietary  restrictions.   POST-HOSPITAL CARE PLAN:  The patient will go to the [facility] on April 28, 2009, at 2 p.m.  She is also to see her primary  care doctor for her ear problems.   DISCHARGE MEDICATIONS:  1. Cymbalta 90 mg daily.  2. Seroquel 50  mg in the morning and 200 mg at bedtime.  3. Cortisporin ear drops 4 drops in each ear 4 times a day.      Jasmine Pang, M.D.  Electronically Signed     BHS/MEDQ  D:  04/28/2009  T:  04/28/2009  Job:  [id number]

## 2011-02-24 NOTE — Op Note (Signed)
[facility]  Patient:    Kathy Conway                    MRN: [id number] Proc. Date: 01/04/00 Adm. Date:  [id number] Attending:  Venita Sheffield                           Operative Report  PREOPERATIVE DIAGNOSIS:       Multiparity.  POSTOPERATIVE DIAGNOSIS:  OPERATION:                    Open laparoscopic tubal sterilization.  SURGEON:                      Kathreen Cosier, M.D.  ASSISTANT:  ANESTHESIA:                   General anesthesia.  ESTIMATED BLOOD LOSS:  DESCRIPTION OF PROCEDURE:     Under general anesthesia with the patient in the lithotomy position, the abdomen, perineum, and vagina were prepped and draped and the bladder emptied with the straight catheter.  A weighted speculum was placed in the vagina and the anterior lip of the cervix was grasped with a tenaculum.  A Hulka tenaculum was placed on the cervix.  In the umbilicus, a transverse incision was made and carried down to the rectus fascia.  The fascia was cleaned and grasped with two Kochers.  The fascia and the peritoneum were opened with Mayo scissors. The sleeve of the trocar inserted intraperitoneally.  3 liters of carbon dioxide infused intraperitoneally.  The visualizing scope was inserted and the uterus, tubes, and ovaries were normal.  The cautery probe was inserted through the sleeve of the scope.  The right tube was grasped and the tube traced to the fimbria. Starting one inch from the cornua, the tube was cauterized.  It was cauterized n a total of five places moving lateral from the first site of cautery. Approximately two inches of tube cauterized.  The procedure was done in the exact fashion on he other side.  The probes were removed.  CO2 was allowed to escape from the peritoneal cavity.  The fascia was closed with one stitch of 0 Dexon.  The skin was closed with subcuticular stitch of 3-0 plain.  The patient tolerated  the procedure well and was taken to the recovery room in good condition. DD:  01/04/00 TD:  01/04/00 Job: [id number]

## 2011-02-24 NOTE — Op Note (Signed)
[facility]  Patient:    Kathy Conway                    MRN: [id number] Proc. Date: 06/28/00 Adm. Date:  [id number] Disc. Date: [id number] Attending:  Pleas Koch CC:         Tomi Bamberger, N.P., [facility]   Operative Report  PREOPERATIVE DIAGNOSIS:       Pelvic pain.  POSTOPERATIVE DIAGNOSIS:      Pelvic adhesions of left ovary to pelvic sidewall, pelvic adhesion in the cul-de-sac and possible endometriosis implant.  OPERATION PERFORMED:          Laparoscopic lysis of adhesions and peritoneal biopsies.  SURGEON:                      Georgina Peer, M.D.  ANESTHESIA:                   General.  ESTIMATED BLOOD LOSS:         Less than 25 cc.  COMPLICATIONS:                None.  INDICATIONS:                  This is a 35 year old, gravida 4, para 3, with pelvic pain of three months duration unresolved with antibiotics, negative GI work-up.  She was brought in for an evaluation.  She is aware of risks and complications of laparoscopy including bowel, bladder or vascular injury, possible inability to find the source for pain, and accepted these.  DESCRIPTION OF PROCEDURE:     The patient was taken to the operating room and given a general anesthetic, placed in the dorsal lithotomy position, prepped and draped in the normal sterile fashion.  A Hulka manipulator was placed into the cervix, uterus was anterior, mobile and nontender, and there were no adnexal masses.  A vertical subumbilical incision was made after Marcaine injected into the skin of the subumbilical and suprapubic ports.  A Veress needle was placed and 2.5 liters of carbon dioxide insufflated creating a pneumoperitoneum.  Laparoscopic trocar and sleeve were placed and under direct vision a 5 mm suprapubic trocar and a 5 mm right lower quadrant trocar were placed.  The following ______ findings were noted.  There appeared to be no injury from  laparoscopic trocar placements.  The surface of the bowel appeared normal.  The uterus appeared normal.  The cul-de-sac had a am adhesion in its midportion just above the colon reflex, the rectosigmoid reflection.  There was also a red spot with some stellate scarring around it just lateral to the adhesion.  The right ovary was freely mobile and the right tube was normal except for evidence of prior tubal ligation, but there was no adhesions.  The left tube appeared normal with evidence of tubal ligation, and the left ovary, however, was adherent with a dense adhesion from its pole to the left pelvic sidewall.  This was medial to the ureter course which was easily seen.  Using a Harmonic scalpel, dissecting hook, the adhesion from the ovary to the pelvic sidewall was easily coagulated and lysed.  The adhesion in the cul-de-sac coagulated and lysed.  Biopsy of the red implant with cauterization removed it in its entirety.  A photo documentation of all these findings was made.  The ports were then removed, air allowed to escape and the incision was closed  with 4-0 subcuticular Dexon and Dexon sutures in the skin.  Sponge, needle, and instrument counts were correct.  The patient tolerated the procedure well and was sent to the recovery area in good condition. DD:  06/28/00 TD:  06/30/00 Job: [id number]

## 2011-02-24 NOTE — Op Note (Signed)
NAME:  Kathy Conway NO.:  [id number]   MEDICAL RECORD NO.:  [id number]          PATIENT TYPE:  AMB   LOCATION:  SDC                           FACILITY:  [facility]   PHYSICIAN:  Kathreen Cosier, M.D.DATE OF BIRTH:  May 13, 1976   DATE OF PROCEDURE:  04/26/2005  DATE OF DISCHARGE:                                 OPERATIVE REPORT   PREOPERATIVE DIAGNOSIS:  Dysfunctional uterine bleeding.   PROCEDURE:  Hysteroscopy, D&C, and NovaSure ablation.   DESCRIPTION OF PROCEDURE:  Under general anesthesia with the patient in the  lithotomy position, the perineum and vagina were prepped and draped.  The  bladder was emptied with a straight catheter.  Bimanual examination revealed  the uterus to be normal size.  A speculum was placed in the vagina.  The  cervix was injected with 8 mL of 1% Xylocaine at 3, 9, and 12 o'clock.  Anterior lip of the cervix grasped with a tenaculum.  Endocervix curetted.  A small amount of tissue obtained.  Endometrial cavity sounded to 8 cm.  The  Hegar dilator was used to measure the cervical length and this was 4 cm  giving [place] a cavity length of 4 cm.  The cervix was dilated with a #25 Pratt.  Diagnostic hysteroscope was inserted.  The cavity appeared to be normal.  Sharp curettage was then performed.  A small amount of tissue was obtained.  NovaSure ablation device was inserted.  The cavity width was 3.5 cm and the  cavity integrity test was passed.  Ablation was done for 66 seconds at a  power of 77 watts.  Repeat hysteroscopy showed total ablation of the cavity.  The patient tolerated the procedure well and was taken to the recovery room  in good condition.       BAM/MEDQ  D:  04/26/2005  T:  04/26/2005  Job:  [id number]

## 2011-02-24 NOTE — Consult Note (Signed)
[facility]  Patient:    LASTACIA, SOLUM Visit Number: [id number] MRN: [id number]          Service Type: GYN Location: [facility] Attending Physician:  Michaelle Copas Dictated by:   Elinor Dodge, M.D. Proc. Date: 02/04/02 Admit Date:  01/31/2002 Discharge Date: 01/31/2002                            Consultation Report  REASON FOR CONSULTATION:  The patient is a 35 year old para 2-0-1-2 who had a bilateral tubal ligation two years ago. Since that time has complained of left adnexal pain almost consistently, but much worse prior to and during her period. In May of 2002 she had a laparoscopy where they found left adnexal adhesions and a small functional ovarian cyst adhesion, those were cut free but the cyst was left in place. She still has had pain, and then recently seen in the emergency room with severe left adnexal pain. Sonogram at that time showed a left hemorrhagic ovarian cyst, and the patient was referred to GYN clinic.  PHYSICAL EXAMINATION:  ABDOMEN:  Soft, flat, somewhat tender in the left adnexal region without guarding or rebound.  PELVIC:  External genitalia was normal. Introitus is marital. The vagina was clean and well rugated, and the cervix was parous. The uterus is small interior, normal size, shape, consistency. The right adnexa was normal. The left adnexa was exclusively tender to palpitation, and the patient state this increased the pain that she has been having. The pain has gotten severe enough that it is giving her nausea.  We have discussed treatment with the patient and she cannot tolerate oral contraceptives very well, so we thought we would try a shot of Depo-Provera for a three-month period, observe. If the pain persists we feel will be indicated to do a left salpingo-oophorectomy and lysis adhesions on that side.   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 Dictated by:    Elinor Dodge, M.D. Attending Physician:  Michaelle Copas DD:  02/04/02 TD:  02/04/02 Job: [id number]

## 2011-07-06 LAB — URINE MICROSCOPIC-ADD ON

## 2011-07-06 LAB — CBC
HCT: 40
HCT: 36.8
Hemoglobin: 14
Hemoglobin: 13.1
MCHC: 35.1
MCHC: 35.6
MCV: 90.4
MCV: 91.6
Platelets: 222
Platelets: 167
RBC: 4.42
RBC: 4.02
RDW: 13.4
RDW: 13.6
WBC: 12.9 — ABNORMAL HIGH
WBC: 9.6

## 2011-07-06 LAB — DIFFERENTIAL
Basophils Absolute: 0
Basophils Absolute: 0
Basophils Relative: 0
Basophils Relative: 0
Eosinophils Absolute: 0.2
Eosinophils Absolute: 0.2
Eosinophils Relative: 2
Eosinophils Relative: 2
Lymphocytes Relative: 27
Lymphocytes Relative: 24
Lymphs Abs: 3.5
Lymphs Abs: 2.3
Monocytes Absolute: 0.6
Monocytes Absolute: 0.4
Monocytes Relative: 5
Monocytes Relative: 4
Neutro Abs: 8.5 — ABNORMAL HIGH
Neutro Abs: 6.6
Neutrophils Relative %: 66
Neutrophils Relative %: 69

## 2011-07-06 LAB — URINALYSIS, ROUTINE W REFLEX MICROSCOPIC
Bilirubin Urine: NEGATIVE
Glucose, UA: NEGATIVE
Ketones, ur: NEGATIVE
Nitrite: NEGATIVE
Protein, ur: NEGATIVE
Specific Gravity, Urine: 1.031 — ABNORMAL HIGH
Urobilinogen, UA: 0.2
pH: 5.5

## 2011-07-06 LAB — URINE CULTURE: Colony Count: >=100000

## 2011-07-06 LAB — PREGNANCY, URINE: Preg Test, Ur: NEGATIVE

## 2011-07-10 LAB — POCT URINALYSIS DIP (DEVICE)
Bilirubin Urine: NEGATIVE
Glucose, UA: NEGATIVE
Nitrite: NEGATIVE
Operator id: 239701

## 2011-07-18 LAB — DIFFERENTIAL
Eosinophils Absolute: 0.3
Eosinophils Relative: 2
Lymphs Abs: 3.2

## 2011-07-18 LAB — COMPREHENSIVE METABOLIC PANEL
ALT: 18
AST: 19
CO2: 25
Chloride: 106
GFR calc Af Amer: >60
GFR calc non Af Amer: >60
Sodium: 138
Total Bilirubin: 0.9

## 2011-07-18 LAB — LIPASE, BLOOD: Lipase: 27

## 2011-07-18 LAB — CBC
RBC: 4.24
WBC: 10.6 — ABNORMAL HIGH

## 2011-07-19 LAB — URINE CULTURE: Colony Count: >=100000

## 2011-07-19 LAB — URINALYSIS, ROUTINE W REFLEX MICROSCOPIC
Bilirubin Urine: NEGATIVE
Ketones, ur: NEGATIVE
Nitrite: NEGATIVE
pH: 5.5

## 2011-07-19 LAB — URINE MICROSCOPIC-ADD ON

## 2011-07-20 LAB — URINALYSIS, ROUTINE W REFLEX MICROSCOPIC
Nitrite: NEGATIVE
Specific Gravity, Urine: 1.023
Urobilinogen, UA: 1
pH: 7

## 2011-07-20 LAB — POCT PREGNANCY, URINE: Operator id: 277751

## 2011-10-17 ENCOUNTER — Encounter: Payer: Self-pay | Admitting: *Deleted

## 2011-10-17 ENCOUNTER — Emergency Department (HOSPITAL_COMMUNITY): Payer: Medicaid - Out of State

## 2011-10-17 ENCOUNTER — Emergency Department (HOSPITAL_COMMUNITY)
Admission: EM | Admit: 2011-10-17 | Discharge: 2011-10-17 | Disposition: A | Discharge status: Home / Self Care | Payer: Medicaid - Out of State | Attending: Emergency Medicine | Admitting: Emergency Medicine

## 2011-10-17 DIAGNOSIS — J45909 Unspecified asthma, uncomplicated: Secondary | ICD-10-CM | POA: Insufficient documentation

## 2011-10-17 DIAGNOSIS — Z9851 Tubal ligation status: Secondary | ICD-10-CM | POA: Insufficient documentation

## 2011-10-17 DIAGNOSIS — F17200 Nicotine dependence, unspecified, uncomplicated: Secondary | ICD-10-CM | POA: Insufficient documentation

## 2011-10-17 DIAGNOSIS — Z9079 Acquired absence of other genital organ(s): Secondary | ICD-10-CM | POA: Insufficient documentation

## 2011-10-17 DIAGNOSIS — Z808 Family history of malignant neoplasm of other organs or systems: Secondary | ICD-10-CM | POA: Insufficient documentation

## 2011-10-17 DIAGNOSIS — R51 Headache: Secondary | ICD-10-CM | POA: Insufficient documentation

## 2011-10-17 DIAGNOSIS — Z87828 Personal history of other (healed) physical injury and trauma: Secondary | ICD-10-CM | POA: Insufficient documentation

## 2011-10-17 DIAGNOSIS — Z9889 Other specified postprocedural states: Secondary | ICD-10-CM | POA: Insufficient documentation

## 2011-10-17 DIAGNOSIS — IMO0001 Reserved for inherently not codable concepts without codable children: Secondary | ICD-10-CM | POA: Insufficient documentation

## 2011-10-17 HISTORY — DX: Disorder of kidney and ureter, unspecified: N28.9

## 2011-10-17 HISTORY — DX: Fibromyalgia: M79.7

## 2011-10-17 LAB — URINALYSIS, ROUTINE W REFLEX MICROSCOPIC
Nitrite: NEGATIVE
Protein, ur: NEGATIVE mg/dL
Specific Gravity, Urine: 1.025 (ref 1.005–1.030)
Urobilinogen, UA: 0.2 mg/dL (ref 0.0–1.0)

## 2011-10-17 LAB — PREGNANCY, URINE: Preg Test, Ur: NEGATIVE

## 2011-10-17 MED ORDER — ONDANSETRON 8 MG PO TBDP
8.0000 mg | ORAL_TABLET | Freq: Once | ORAL | Status: AC
  Administered 2011-10-17: 8 mg via ORAL
  Filled 2011-10-17: qty 1

## 2011-10-17 MED ORDER — HYDROMORPHONE HCL PF 2 MG/ML IJ SOLN
2.0000 mg | Freq: Once | INTRAMUSCULAR | Status: AC
  Administered 2011-10-17: 2 mg via INTRAMUSCULAR
  Filled 2011-10-17: qty 1

## 2011-10-17 MED ORDER — OXYCODONE-ACETAMINOPHEN 5-325 MG PO TABS: 1.0000 | ORAL_TABLET | ORAL | Status: AC | PRN

## 2011-10-17 NOTE — ED Notes (Signed)
Pt alert & oriented x4, stable gait. Pt given discharge instructions, paperwork & prescription(s), pt verbalized understanding. Pt left department w/ no further questions.  

## 2011-10-17 NOTE — ED Notes (Signed)
Headache for 2 mos.  Nausea without vomiting.
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
## 2014-06-03 ENCOUNTER — Encounter (HOSPITAL_COMMUNITY): Payer: Self-pay | Admitting: Emergency Medicine

## 2014-06-03 ENCOUNTER — Emergency Department (HOSPITAL_COMMUNITY)
Admission: EM | Admit: 2014-06-03 | Discharge: 2014-06-03 | Disposition: A | Discharge status: Home / Self Care | Payer: Medicaid - Out of State | Attending: Emergency Medicine | Admitting: Emergency Medicine

## 2014-06-03 DIAGNOSIS — M25512 Pain in left shoulder: Secondary | ICD-10-CM

## 2014-06-03 DIAGNOSIS — F17200 Nicotine dependence, unspecified, uncomplicated: Secondary | ICD-10-CM | POA: Insufficient documentation

## 2014-06-03 DIAGNOSIS — M25519 Pain in unspecified shoulder: Secondary | ICD-10-CM | POA: Insufficient documentation

## 2014-06-03 DIAGNOSIS — Z91040 Latex allergy status: Secondary | ICD-10-CM | POA: Insufficient documentation

## 2014-06-03 DIAGNOSIS — IMO0002 Reserved for concepts with insufficient information to code with codable children: Secondary | ICD-10-CM | POA: Diagnosis not present

## 2014-06-03 DIAGNOSIS — Z79899 Other long term (current) drug therapy: Secondary | ICD-10-CM | POA: Insufficient documentation

## 2014-06-03 DIAGNOSIS — IMO0001 Reserved for inherently not codable concepts without codable children: Secondary | ICD-10-CM | POA: Diagnosis not present

## 2014-06-03 DIAGNOSIS — Z791 Long term (current) use of non-steroidal anti-inflammatories (NSAID): Secondary | ICD-10-CM | POA: Insufficient documentation

## 2014-06-03 DIAGNOSIS — J45909 Unspecified asthma, uncomplicated: Secondary | ICD-10-CM | POA: Insufficient documentation

## 2014-06-03 MED ORDER — OXYCODONE-ACETAMINOPHEN 5-325 MG PO TABS
1.0000 | ORAL_TABLET | Freq: Once | ORAL | Status: AC
  Administered 2014-06-03: 1 via ORAL
  Filled 2014-06-03: qty 1

## 2014-06-03 MED ORDER — OXYCODONE-ACETAMINOPHEN 5-325 MG PO TABS: 1.0000 | ORAL_TABLET | Freq: Four times a day (QID) | ORAL | Status: DC | PRN

## 2014-06-03 NOTE — ED Provider Notes (Signed)
CSN: [id number]     Arrival date & time 06/03/14  2031 History   First MD Initiated Contact with Patient 06/03/14 2129     Chief Complaint  Patient presents with  . Shoulder Pain    The patient is here due to shoulder pain.  She adivsed me she was seen at a hospital in [place] and was told she had a 5mm cyst in the joint.  She was supposed to go to to the MD yesterday and missed her appointment because she did not have any gas to get to [place], [place].     (Consider location/radiation/quality/duration/timing/severity/associated sxs/prior Treatment) HPI Comments: Patient qas rtold several months ago that she had a "cyst" in the joint of her L shoulder  She was to see her PCP in [place] yesterday but could not make the appointment recently moved back to [place] and does not have a local PCP . Has a history of corrective surgery in 2009 to that shoulder  Has been taking Naprosyn without relief  Patient is a 38 y.o. female presenting with shoulder pain. The history is provided by the patient.  Shoulder Pain This is a chronic problem. The current episode started more than 1 month ago. The problem occurs constantly. The problem has been unchanged. Associated symptoms include arthralgias. Pertinent negatives include no chills, fever, neck pain, numbness or weakness. The symptoms are aggravated by exertion. She has tried NSAIDs for the symptoms. The treatment provided no relief.    Past Medical History  Diagnosis Date  . Asthma   . Renal disorder   . Fibromyalgia   . Migraine    Past Surgical History  Procedure Laterality Date  . Cholecystectomy    . Abdominal hysterectomy    . Tubal ligation     History reviewed. No pertinent family history. History  Substance Use Topics  . Smoking status: Current Every Day Smoker -- 0.50 packs/day    Types: Cigarettes  . Smokeless tobacco: Never Used  . Alcohol Use: No   OB History   Grav Para Term Preterm Abortions TAB SAB Ect Mult Living            Review of Systems  Constitutional: Negative for fever and chills.  Musculoskeletal: Positive for arthralgias. Negative for neck pain and neck stiffness.  Skin: Negative for wound.  Neurological: Negative for dizziness, weakness and numbness.      Allergies  Codeine; Vicodin; and Latex  Home Medications   Prior to Admission medications   Medication Sig Start Date End Date Taking? Authorizing Provider  Cholecalciferol (VITAMIN D) 2000 UNITS CAPS Take 1 capsule by mouth daily.     Yes Historical Provider, MD  FLUoxetine (PROZAC) 20 MG capsule Take 20 mg by mouth daily.     Yes Historical Provider, MD  fluticasone (FLONASE) 50 MCG/ACT nasal spray Place 2 sprays into the nose daily.     Yes Historical Provider, MD  hydrOXYzine (ATARAX/VISTARIL) 50 MG tablet Take 50 mg by mouth at bedtime.     Yes Historical Provider, MD  indomethacin (INDOCIN) 25 MG capsule Take 25 mg by mouth daily as needed. For pain/inflammation    Yes Historical Provider, MD  Milnacipran (SAVELLA) 50 MG TABS Take 50 mg by mouth 2 (two) times daily.     Yes Historical Provider, MD  QUEtiapine (SEROQUEL) 200 MG tablet Take 200 mg by mouth at bedtime.     Yes Historical Provider, MD  topiramate (TOPAMAX) 100 MG tablet Take 100 mg by mouth 2 (  two) times daily.     Yes Historical Provider, MD  oxyCODONE-acetaminophen (PERCOCET/ROXICET) 5-325 MG per tablet Take 1 tablet by mouth every 6 (six) hours as needed for severe pain. 06/03/14   Arman Filter, NP   BP 130/82  Pulse 79  Temp(Src) 98.8 F (37.1 C) (Oral)  Resp 18  SpO2 99% Physical Exam  Nursing note and vitals reviewed. Constitutional: She appears well-developed and well-nourished.  Morbidly obese  HENT:  Head: Normocephalic.  Eyes: Pupils are equal, round, and reactive to light.  Neck: Normal range of motion.  Cardiovascular: Normal rate and regular rhythm.   Pulmonary/Chest: Effort normal and breath sounds normal.  Musculoskeletal: Normal range  of motion. She exhibits no edema.       Left shoulder: She exhibits tenderness and pain. She exhibits normal range of motion, no bony tenderness, no swelling, no effusion, no crepitus, no deformity, no laceration, no spasm, normal pulse and normal strength.       Arms: Lymphadenopathy:    She has no cervical adenopathy.  Neurological: She is alert.  Skin: Skin is warm and dry. No erythema.    ED Course  Procedures (including critical care time) Labs Review Labs Reviewed - No data to display  Imaging Review No results found.   EKG Interpretation None      MDM   Final diagnoses:  Shoulder pain, left     Will give Rx for pain control and refer to local orthopedist  Recommend that she have her records forwarded    Arman Filter, NP 06/03/14 2157

## 2014-06-03 NOTE — Discharge Instructions (Signed)
Call and make an appointment with Dr. Victorino Dike  Tell them you are being referred though the ED  You should also be taking Ibuprofen on a regular basis

## 2014-06-03 NOTE — ED Notes (Signed)
Discharge instructions reviewed with pt. Pt verbalized understanding.   

## 2014-06-03 NOTE — ED Notes (Signed)
Pt here because she ran out of pain medication and missed her follow up appt. Pt rates pain 8/10.

## 2014-06-03 NOTE — ED Notes (Addendum)
The patient is here due to left shoulder pain.  She adivsed me she was seen at a hospital in [place] and was told she had a 5mm cyst in the joint.  She was supposed to go to to the MD yesterday and missed her appointment because she did not have any gas to get to [place], [place].   She was given percocet for the pain when she went a couple of months ago.  She is here because the pain is worse.

## 2014-06-04 NOTE — ED Provider Notes (Signed)
Medical screening examination/treatment/procedure(s) were performed by non-physician practitioner and as supervising physician I was immediately available for consultation/collaboration.   EKG Interpretation None        Viana Sleep, MD 06/04/14 0009 

## 2015-12-28 ENCOUNTER — Encounter (HOSPITAL_COMMUNITY): Payer: Self-pay | Admitting: Emergency Medicine

## 2015-12-28 ENCOUNTER — Emergency Department (INDEPENDENT_AMBULATORY_CARE_PROVIDER_SITE_OTHER)
Admission: EM | Admit: 2015-12-28 | Discharge: 2015-12-28 | Disposition: A | Discharge status: Nursing Home (Includes ICF) | Payer: Self-pay | Source: Home / Self Care | Attending: Family Medicine | Admitting: Family Medicine

## 2015-12-28 ENCOUNTER — Emergency Department (HOSPITAL_COMMUNITY)
Admission: EM | Admit: 2015-12-28 | Discharge: 2015-12-28 | Disposition: A | Discharge status: Home / Self Care | Payer: Medicaid - Out of State | Attending: Emergency Medicine | Admitting: Emergency Medicine

## 2015-12-28 ENCOUNTER — Encounter (HOSPITAL_COMMUNITY): Payer: Self-pay | Admitting: *Deleted

## 2015-12-28 DIAGNOSIS — Z791 Long term (current) use of non-steroidal anti-inflammatories (NSAID): Secondary | ICD-10-CM | POA: Insufficient documentation

## 2015-12-28 DIAGNOSIS — Z8679 Personal history of other diseases of the circulatory system: Secondary | ICD-10-CM | POA: Insufficient documentation

## 2015-12-28 DIAGNOSIS — R600 Localized edema: Secondary | ICD-10-CM | POA: Insufficient documentation

## 2015-12-28 DIAGNOSIS — M79662 Pain in left lower leg: Secondary | ICD-10-CM | POA: Insufficient documentation

## 2015-12-28 DIAGNOSIS — F17210 Nicotine dependence, cigarettes, uncomplicated: Secondary | ICD-10-CM | POA: Insufficient documentation

## 2015-12-28 DIAGNOSIS — Z91040 Latex allergy status: Secondary | ICD-10-CM | POA: Insufficient documentation

## 2015-12-28 DIAGNOSIS — Z87448 Personal history of other diseases of urinary system: Secondary | ICD-10-CM | POA: Insufficient documentation

## 2015-12-28 DIAGNOSIS — R609 Edema, unspecified: Secondary | ICD-10-CM

## 2015-12-28 DIAGNOSIS — M79605 Pain in left leg: Secondary | ICD-10-CM

## 2015-12-28 DIAGNOSIS — J45909 Unspecified asthma, uncomplicated: Secondary | ICD-10-CM | POA: Insufficient documentation

## 2015-12-28 HISTORY — DX: Personal history of other diseases of the musculoskeletal system and connective tissue: Z87.39

## 2015-12-28 LAB — CBC WITH DIFFERENTIAL/PLATELET
BASOS ABS: 0 10*3/uL (ref 0.0–0.1)
BASOS PCT: 0 %
EOS ABS: 0.4 10*3/uL (ref 0.0–0.7)
Eosinophils Relative: 3 %
HCT: 39.7 % (ref 36.0–46.0)
HEMOGLOBIN: 13.3 g/dL (ref 12.0–15.0)
Lymphocytes Relative: 31 %
Lymphs Abs: 3.5 10*3/uL (ref 0.7–4.0)
MCH: 30.6 pg (ref 26.0–34.0)
MCHC: 33.5 g/dL (ref 30.0–36.0)
MCV: 91.3 fL (ref 78.0–100.0)
MONOS PCT: 4 %
Monocytes Absolute: 0.4 10*3/uL (ref 0.1–1.0)
NEUTROS PCT: 62 %
Neutro Abs: 6.8 10*3/uL (ref 1.7–7.7)
Platelets: 206 10*3/uL (ref 150–400)
RBC: 4.35 MIL/uL (ref 3.87–5.11)
RDW: 13.6 % (ref 11.5–15.5)
WBC: 11 10*3/uL — AB (ref 4.0–10.5)

## 2015-12-28 LAB — D-DIMER, QUANTITATIVE (NOT AT ARMC)

## 2015-12-28 LAB — COMPREHENSIVE METABOLIC PANEL
ALK PHOS: 50 U/L (ref 38–126)
ALT: 15 U/L (ref 14–54)
AST: 14 U/L — ABNORMAL LOW (ref 15–41)
Albumin: 3.2 g/dL — ABNORMAL LOW (ref 3.5–5.0)
Anion gap: 11 (ref 5–15)
BILIRUBIN TOTAL: 0.5 mg/dL (ref 0.3–1.2)
BUN: 16 mg/dL (ref 6–20)
CALCIUM: 9 mg/dL (ref 8.9–10.3)
CO2: 23 mmol/L (ref 22–32)
CREATININE: 0.71 mg/dL (ref 0.44–1.00)
Chloride: 108 mmol/L (ref 101–111)
Glucose, Bld: 109 mg/dL — ABNORMAL HIGH (ref 65–99)
Potassium: 4.4 mmol/L (ref 3.5–5.1)
Sodium: 142 mmol/L (ref 135–145)
TOTAL PROTEIN: 5.9 g/dL — AB (ref 6.5–8.1)

## 2015-12-28 MED ORDER — FUROSEMIDE 20 MG PO TABS: 20.0000 mg | ORAL_TABLET | Freq: Every day | ORAL | Status: DC

## 2015-12-28 NOTE — Discharge Instructions (Signed)
The test for blood clot is negative.  you been given a eScription for medication called Lasix, which is a water pill.  Please take this as directed once daily in the morning, this we'll make you urinate and help remove some of the fluid from your extremities.   You've also been fitted with compression stockings.  Wear these at all times while up and about.  They should be placed before you get out of bed in the morning and removed in the evening. These make it a point with your primary care physician for follow-up care or community wellness to establish care  Edema Edema is an abnormal buildup of fluids. It is more common in your legs and thighs. Painless swelling of the feet and ankles is more likely as a person ages. It also is common in looser skin, like around your eyes. HOME CARE   Keep the affected body part above the level of the heart while lying down.  Do not sit still or stand for a long time.  Do not put anything right under your knees when you lie down.  Do not wear tight clothes on your upper legs.  Exercise your legs to help the puffiness (swelling) go down.  Wear elastic bandages or support stockings as told by your doctor.  A low-salt diet may help lessen the puffiness.  Only take medicine as told by your doctor. GET HELP IF:  Treatment is not working.  You have heart, liver, or kidney disease and notice that your skin looks puffy or shiny.  You have puffiness in your legs that does not get better when you raise your legs.  You have sudden weight gain for no reason. GET HELP RIGHT AWAY IF:   You have shortness of breath or chest pain.  You cannot breathe when you lie down.  You have pain, redness, or warmth in the areas that are puffy.  You have heart, liver, or kidney disease and get edema all of a sudden.  You have a fever and your symptoms get worse all of a sudden. MAKE SURE YOU:   Understand these instructions.  Will watch your condition.  Will  get help right away if you are not doing well or get worse.   This information is not intended to replace advice given to you by your health care provider. Make sure you discuss any questions you have with your health care provider.   Document Released: 03/13/2008 Document Revised: 09/30/2013 Document Reviewed: 07/18/2013 Elsevier Interactive Patient Education 2016 [employer] How to Use Compression Stockings Compression stockings are elastic socks that squeeze the legs. They help to increase blood flow to the legs, decrease swelling in the legs, and reduce the chance of developing blood clots in the lower legs. Compression stockings are often used by people who:  Are recovering from surgery.  Have poor circulation in their legs.  Are prone to getting blood clots in their legs.  Have varicose veins.  Sit or stay in bed for long periods of time. HOW TO USE COMPRESSION STOCKINGS Before you put on your compression stockings:  Make sure that they are the correct size. If you do not know your size, ask your health care provider.  Make sure that they are clean, dry, and in good condition.  Check them for rips and tears. Do not put them on if they are ripped or torn. Put your stockings on first thing in the morning, before you get out of bed. Keep them  on for as long as your health care provider advises. When you are wearing your stockings:  Keep them as smooth as possible. Do not allow them to bunch up. It is especially important to prevent the stockings from bunching up around your toes or behind your knees.  Do not roll the stockings downward and leave them rolled down. This can decrease blood flow to your leg.  Change them right away if they become wet or dirty. When you take off your stockings, inspect your legs and feet. Anything that does not seem normal may require medical attention. Look for:  Open sores.  Red spots.  Swelling. INFORMATION AND TIPS  Do not stop wearing  your compression stockings without talking to your health care provider first.  Wash your stockings everyday with mild detergent in cold or warm water. Do not use bleach. Air-dry your stockings or dry them in a clothes dryer on low heat.  Replace your stockings every 3-6 months.  If skin moisturizing is part of your treatment plan, apply lotion or cream at night so that your skin will be dry when you put on the stockings in the morning. It is harder to put the stockings on when you have lotion on your legs or feet. SEEK MEDICAL CARE IF: Remove your stockings and seek medical care if:  You have a feeling of pins and needles in your feet or legs.  You have any new changes in your skin.  You have skin lesions that are getting worse.  You have swelling or pain that is getting worse. SEEK IMMEDIATE MEDICAL CARE IF:  You have numbness or tingling in your lower legs that does not get better immediately after you take the stockings off.  Your toes or feet become cold and blue.  You develop open sores or red spots on your legs that do not go away.  You see or feel a warm spot on your leg.  You have new swelling or soreness in your leg.  You are short of breath or you have chest pain for no reason.  You have a rapid or irregular heartbeat.  You feel light-headed or dizzy.   This information is not intended to replace advice given to you by your health care provider. Make sure you discuss any questions you have with your health care provider.   Document Released: 07/23/2009 Document Revised: 02/09/2015 Document Reviewed: 09/02/2014 Elsevier Interactive Patient Education [employer]

## 2015-12-28 NOTE — ED Notes (Addendum)
Per urgent care note: Concerned for swelling in both hands and bilateral feet swollen and left calf feels tight or "charlie horse". Left lateral foot that is painful. Onset of symptoms 2 days ago. Patient has had one extremity swell and told "retaining fluid" and "inflammation medication". 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   Pt sent here from UC for above complaint to get US to rule out blood clot in left calf. No redness or swelling noted. Pt reports tenderness to palpation and reports pain when she is asked to point her toes upward. Pt denies SOB, CP

## 2015-12-28 NOTE — ED Provider Notes (Signed)
CSN: [id number]     Arrival date & time 12/28/15  1722 History   First MD Initiated Contact with Patient 12/28/15 2111     Chief Complaint  Patient presents with  . Leg Pain     (Consider location/radiation/quality/duration/timing/severity/associated sxs/prior Treatment) HPI Comments: This a morbidly obese female, who reports, that she's had swelling of her lower extremities as well as her hands for a while but for the past 2 days.  The swelling has gotten worse in her lower legs.  She describes the pain in her left calf as a pretracheal early course she's tried over-the-counter ibuprofen without relief.  She does state that the swelling is decreased in the morning after she's had a good night sleep.  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  He went to urgent care who sent her here due to their concern for a DVT, although she has no risk factors other than obesity. She denies any recent travel, no shortness of breath, no chest pain, no tachycardia.  No history of previous DVT  Patient is a 40 y.o. female presenting with leg pain. The history is provided by the patient.  Leg Pain Location:  Leg Time since incident:  2 days Injury: no   Leg location:  L lower leg and R lower leg Pain details:    Quality:  Aching   Radiates to:  Does not radiate   Severity:  Moderate   Onset quality:  Gradual   Duration:  2 days   Timing:  Constant   Progression:  Unchanged Chronicity:  New Dislocation: no   Foreign body present:  No foreign bodies Tetanus status:  Unknown Prior injury to area:  No Relieved by:  Nothing Exacerbated by: standing. Ineffective treatments:  NSAIDs Associated symptoms: swelling   Associated symptoms: no fever     Past Medical History  Diagnosis Date  . Asthma   . Renal disorder   . Fibromyalgia   . Migraine   . H/O degenerative disc disease    Past Surgical History  Procedure Laterality Date  . Cholecystectomy    . Abdominal  hysterectomy    . Tubal ligation     History reviewed. No pertinent family history. Social History  Substance Use Topics  . Smoking status: Current Every Day Smoker -- 0.50 packs/day    Types: Cigarettes  . Smokeless tobacco: Never Used  . Alcohol Use: No   OB History    No data available     Review of Systems  Constitutional: Negative for fever and chills.  Respiratory: Negative for shortness of breath.   Cardiovascular: Positive for leg swelling. Negative for chest pain.  Gastrointestinal: Negative for abdominal pain.  Musculoskeletal: Positive for myalgias. Negative for joint swelling.  All other systems reviewed and are negative.     Allergies  Codeine; Vicodin; and Latex  Home Medications   Prior to Admission medications   Medication Sig Start Date End Date Taking? Authorizing Provider  ibuprofen (ADVIL,MOTRIN) 400 MG tablet Take 400 mg by mouth every 6 (six) hours as needed for mild pain.   Yes Historical Provider, MD  naproxen (NAPROSYN) 250 MG tablet Take 250 mg by mouth 2 (two) times daily with a meal.   Yes Historical Provider, MD  furosemide (LASIX) 20 MG tablet Take 1 tablet (20 mg total) by mouth daily. 12/28/15 01/01/16  Earley FavorGail Kaci Dillie, NP   BP 111/66 mmHg  Pulse 80  Temp(Src) 98.9 F (37.2 C) (  Oral)  Resp 18  Wt 118.162 kg  SpO2 97% Physical Exam  Constitutional: She appears well-developed and well-nourished.  HENT:  Head: Normocephalic and atraumatic.  Eyes: Pupils are equal, round, and reactive to light.  Neck: Normal range of motion.  Cardiovascular: Normal rate and regular rhythm.   Pulmonary/Chest: Effort normal and breath sounds normal.  Abdominal: Soft. She exhibits no distension. There is no tenderness.  Musculoskeletal: She exhibits edema and tenderness.  Patient has mild 1-2 plus edema of bilateral lower extremities, left calf is tender in the posterior area.  She does not have a positive Homans sign  Neurological: She is alert.  Skin: Skin  is warm. No pallor.  Nursing note and vitals reviewed.   ED Course  Procedures (including critical care time) Labs Review Labs Reviewed  COMPREHENSIVE METABOLIC PANEL - Abnormal; Notable for the following:    Glucose, Bld 109 (*)    Total Protein 5.9 (*)    Albumin 3.2 (*)    AST 14 (*)    All other components within normal limits  CBC WITH DIFFERENTIAL/PLATELET - Abnormal; Notable for the following:    WBC 11.0 (*)    All other components within normal limits  D-DIMER, QUANTITATIVE (NOT AT [facility])    Imaging Review No results found. I have personally reviewed and evaluated these images and lab results as part of my medical decision-making.   EKG Interpretation None     Patient's labs have been evaluated.  There is no elevated white count.  Her BUN/creatinine are within normal parameters.  Vital signs are stable.  She does have mild diastolic hypertension of 90 due to the hour him unable to obtain a vascular Doppler, but will obtain a d-dimer.  This has been discussed with the patient MDM   Final diagnoses:  Peripheral edema         Earley Favor, NP 12/29/15 1610  Rolland Porter, MD 01/05/16 [id number]

## 2015-12-28 NOTE — ED Notes (Signed)
Concerned for swelling in both hands and bilateral feet swollen and left calf feels tight or "charlie horse".  Left lateral foot that is painful.   Onset of symptoms 2 days ago.  Patient has had one extremity swell and told "retaining fluid" and "inflammation medication".  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

## 2015-12-28 NOTE — ED Provider Notes (Addendum)
CSN: [id number]     Arrival date & time 12/28/15  1358 History   First MD Initiated Contact with Patient 12/28/15 1645     Chief Complaint  Patient presents with  . Edema   (Consider location/radiation/quality/duration/timing/severity/associated sxs/prior Treatment) Patient is a 40 y.o. female presenting with leg pain. The history is provided by the patient.  Leg Pain Location:  Leg Time since incident:  2 days Injury: no   Leg location:  L lower leg Pain details:    Quality:  Cramping   Radiates to:  Does not radiate   Severity:  Moderate (having difficulty working 12 work shifts.) Chronicity:  New Dislocation: no   Prior injury to area:  No Associated symptoms: swelling   Associated symptoms: no fever and no numbness   Risk factors: obesity     Past Medical History  Diagnosis Date  . Asthma   . Renal disorder   . Fibromyalgia   . Migraine   . H/O degenerative disc disease    Past Surgical History  Procedure Laterality Date  . Cholecystectomy    . Abdominal hysterectomy    . Tubal ligation     No family history on file. Social History  Substance Use Topics  . Smoking status: Current Every Day Smoker -- 0.50 packs/day    Types: Cigarettes  . Smokeless tobacco: Never Used  . Alcohol Use: No   OB History    No data available     Review of Systems  Constitutional: Negative.  Negative for fever.  Musculoskeletal: Positive for joint swelling and gait problem.  Skin: Negative.   All other systems reviewed and are negative.   Allergies  Codeine; Vicodin; and Latex  Home Medications   Prior to Admission medications   Medication Sig Start Date End Date Taking? Authorizing Provider  Cholecalciferol (VITAMIN D) 2000 UNITS CAPS Take 1 capsule by mouth daily.      Historical Provider, MD  FLUoxetine (PROZAC) 20 MG capsule Take 20 mg by mouth daily.      Historical Provider, MD  fluticasone (FLONASE) 50 MCG/ACT nasal spray Place 2 sprays into the nose daily.       Historical Provider, MD  hydrOXYzine (ATARAX/VISTARIL) 50 MG tablet Take 50 mg by mouth at bedtime.      Historical Provider, MD  indomethacin (INDOCIN) 25 MG capsule Take 25 mg by mouth daily as needed. For pain/inflammation     Historical Provider, MD  Milnacipran (SAVELLA) 50 MG TABS Take 50 mg by mouth 2 (two) times daily.      Historical Provider, MD  oxyCODONE-acetaminophen (PERCOCET/ROXICET) 5-325 MG per tablet Take 1 tablet by mouth every 6 (six) hours as needed for severe pain. Patient not taking: Reported on 12/28/2015 06/03/14   Earley FavorGail Schulz, NP  QUEtiapine (SEROQUEL) 200 MG tablet Take 200 mg by mouth at bedtime.      Historical Provider, MD  topiramate (TOPAMAX) 100 MG tablet Take 100 mg by mouth 2 (two) times daily.      Historical Provider, MD   Meds Ordered and Administered this Visit  Medications - No data to display  BP 157/107 mmHg  Pulse 78  Temp(Src) 98.1 F (36.7 C) (Oral)  Resp 22  SpO2 98% No data found.   Physical Exam  Constitutional: She is oriented to person, place, and time. She appears well-developed and well-nourished. No distress.  Musculoskeletal: She exhibits edema and tenderness.       Legs: Neurological: She is alert and oriented to  person, place, and time.  Skin: Skin is warm and dry.  Nursing note and vitals reviewed.   ED Course  Procedures (including critical care time)  Labs Review Labs Reviewed - No data to display  Imaging Review No results found.   Visual Acuity Review  Right Eye Distance:   Left Eye Distance:   Bilateral Distance:    Right Eye Near:   Left Eye Near:    Bilateral Near:         MDM   1. Pain of left lower extremity    Sent for dvt eval of left calf pain and swelling.Linna Hoff, MD 12/28/15 1701  Linna Hoff, MD 12/28/15 [phone]

## 2015-12-28 NOTE — ED Notes (Signed)
Calf 73cm tall x 44cm around.  Will call pharm for teds

## 2016-02-11 ENCOUNTER — Encounter: Payer: Self-pay | Admitting: Physician Assistant

## 2016-02-11 ENCOUNTER — Ambulatory Visit: Payer: Medicaid - Out of State | Attending: Physician Assistant | Admitting: Physician Assistant

## 2016-02-11 VITALS — BP 104/72 | HR 75 | Temp 98.2°F | Resp 16 | Ht 66.0 in | Wt 254.0 lb

## 2016-02-11 DIAGNOSIS — Z9049 Acquired absence of other specified parts of digestive tract: Secondary | ICD-10-CM | POA: Insufficient documentation

## 2016-02-11 DIAGNOSIS — Z79899 Other long term (current) drug therapy: Secondary | ICD-10-CM | POA: Insufficient documentation

## 2016-02-11 DIAGNOSIS — Z9851 Tubal ligation status: Secondary | ICD-10-CM | POA: Insufficient documentation

## 2016-02-11 DIAGNOSIS — G894 Chronic pain syndrome: Secondary | ICD-10-CM | POA: Insufficient documentation

## 2016-02-11 DIAGNOSIS — R600 Localized edema: Secondary | ICD-10-CM | POA: Diagnosis not present

## 2016-02-11 DIAGNOSIS — Z90710 Acquired absence of both cervix and uterus: Secondary | ICD-10-CM | POA: Insufficient documentation

## 2016-02-11 MED ORDER — TRAMADOL HCL 50 MG PO TABS: 50.0000 mg | ORAL_TABLET | Freq: Three times a day (TID) | ORAL | Status: DC | PRN

## 2016-02-11 NOTE — Progress Notes (Signed)
Patient's here for f/up ED for peripheral edema.  Patient reports since d/c swelling has since subsided.  Patient is currently not on amy medication.

## 2016-02-11 NOTE — Progress Notes (Signed)
Kathy Conway  WUJ:[id number]  [id number]  DOB - 05-Sep-1976  Chief Complaint  Patient presents with  . Follow-up  . Edema  . Pain       Subjective:   Kathy Conway is a 40 y.o. female here today for establishment of care. She Presented to the emergency department on 12/28/2015 with swelling of bilateral lower extremity and hands. 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 She also been having some pain in her left calf. She initially presented to urgent care who sent her to the emergency department to rule out DVT. The ED staff described 1-2+ bilateral edema. Her labs were okay. Her d-dimer was negative. She was given a prescription for Lasix but never got it filled. She did not follow up here as she was asked to. She states that the swelling improved. She stopped all sodas and energy drinks and drinks mostly water now. She's lost 15 pounds.  Her other concern is her history of fibromyalgia, degenerative joint disease, bulging disc and bone spurs. She previously was taken muscle relaxants and Percocet. She previously was enrolled in the pain clinic in [place]. She has not had insurance for 3 years and is wanting something for pain as needed.    ROS: GEN: denies fever or chills, denies change in weight Skin: denies lesions or rashes LUNGS: denies SHOB, dyspnea, PND, orthopnea CV: denies CP or palpitations EXT: denies muscle spasms or swelling; no pain in lower ext, no weakness NEURO: denies numbness or tingling, denies sz, stroke or TIA  ALLERGIES: Allergies  Allergen Reactions  . Codeine Shortness Of Breath  . Vicodin [Hydrocodone-Acetaminophen] Shortness Of Breath  . Latex Itching, Swelling and Rash    PAST MEDICAL HISTORY: Past Medical History  Diagnosis Date  . Asthma   . Renal disorder   . Fibromyalgia   . Migraine   . H/O degenerative disc disease     PAST SURGICAL HISTORY: Past Surgical History  Procedure  Laterality Date  . Cholecystectomy    . Abdominal hysterectomy    . Tubal ligation      MEDICATIONS AT HOME: Prior to Admission medications   Medication Sig Start Date End Date Taking? Authorizing Provider  furosemide (LASIX) 20 MG tablet Take 1 tablet (20 mg total) by mouth daily. 12/28/15 01/01/16  Earley FavorGail Schulz, NP  ibuprofen (ADVIL,MOTRIN) 400 MG tablet Take 400 mg by mouth every 6 (six) hours as needed for mild pain. Reported on 02/11/2016    Historical Provider, MD  naproxen (NAPROSYN) 250 MG tablet Take 250 mg by mouth 2 (two) times daily with a meal. Reported on 02/11/2016    Historical Provider, MD  traMADol (ULTRAM) 50 MG tablet Take 1 tablet (50 mg total) by mouth every 8 (eight) hours as needed for moderate pain. 02/11/16   Vivianne Masteriffany S Jamesetta Greenhalgh, PA-C     Objective:   Filed Vitals:   02/11/16 1210  BP: 104/72  Pulse: 75  Temp: 98.2 F (36.8 C)  TempSrc: Oral  Resp: 16  Height: 5\' 6"  (1.676 m)  Weight: 254 lb (115.214 kg)  SpO2: 97%    Exam General appearance : Awake, alert, not in any distress. Speech Clear. Not toxic looking Neck: supple, no JVD. No cervical lymphadenopathy.  Chest:Good air entry bilaterally, no added sounds  CVS: S1 S2 regular, no murmurs.  Extremities: B/L Lower Ext shows no edema, both legs are warm to touch Neurology: Awake alert, and oriented X 3,  CN II-XII intact, Non focal Skin:No Rash Wounds:N/A   Assessment & Plan  1. Bilateral LEE-resolved  -resolved with dietary measures/changes  -Encouraged to abstain from sodium/sodas etc   2. Chronic Pain Syndrome/hx Fibromyalgia/DJD  -tramadol prn  3. Smoker   -not interested in cessation at this time   Return in about 4 weeks (around 03/10/2016).  The patient was given clear instructions to go to ER or return to medical center if symptoms don't improve, worsen or new problems develop. The patient verbalized understanding. The patient was told to call to get lab results if they haven't heard anything  in the next week.   This note has been created with Education officer, environmental. Any transcriptional errors are unintentional.    Scot Jun, PA-C [facility], [place] [phone]   02/11/2016, 12:21 PM

## 2016-03-06 ENCOUNTER — Ambulatory Visit: Payer: Medicaid - Out of State | Admitting: Family Medicine

## 2016-03-10 ENCOUNTER — Ambulatory Visit: Payer: Medicaid - Out of State | Attending: Family Medicine | Admitting: Family Medicine

## 2016-03-10 ENCOUNTER — Encounter: Payer: Self-pay | Admitting: Family Medicine

## 2016-03-10 VITALS — BP 118/78 | HR 88 | Temp 98.3°F | Resp 16 | Ht 66.0 in | Wt 256.0 lb

## 2016-03-10 DIAGNOSIS — L0292 Furuncle, unspecified: Secondary | ICD-10-CM | POA: Diagnosis not present

## 2016-03-10 DIAGNOSIS — F411 Generalized anxiety disorder: Secondary | ICD-10-CM

## 2016-03-10 DIAGNOSIS — R202 Paresthesia of skin: Secondary | ICD-10-CM | POA: Diagnosis not present

## 2016-03-10 DIAGNOSIS — Z8739 Personal history of other diseases of the musculoskeletal system and connective tissue: Secondary | ICD-10-CM | POA: Insufficient documentation

## 2016-03-10 DIAGNOSIS — M797 Fibromyalgia: Secondary | ICD-10-CM | POA: Insufficient documentation

## 2016-03-10 DIAGNOSIS — F17210 Nicotine dependence, cigarettes, uncomplicated: Secondary | ICD-10-CM | POA: Insufficient documentation

## 2016-03-10 DIAGNOSIS — R300 Dysuria: Secondary | ICD-10-CM

## 2016-03-10 DIAGNOSIS — Z79899 Other long term (current) drug therapy: Secondary | ICD-10-CM | POA: Insufficient documentation

## 2016-03-10 DIAGNOSIS — R208 Other disturbances of skin sensation: Secondary | ICD-10-CM | POA: Insufficient documentation

## 2016-03-10 LAB — POCT URINALYSIS DIPSTICK
Bilirubin, UA: NEGATIVE
Glucose, UA: NEGATIVE
Ketones, UA: NEGATIVE
LEUKOCYTES UA: NEGATIVE
Nitrite, UA: NEGATIVE
PROTEIN UA: NEGATIVE
Spec Grav, UA: 1.02
UROBILINOGEN UA: 0.2
pH, UA: 5.5

## 2016-03-10 MED ORDER — GABAPENTIN 300 MG PO CAPS: 300.0000 mg | ORAL_CAPSULE | Freq: Every day | ORAL | Status: DC

## 2016-03-10 MED ORDER — DOXYCYCLINE HYCLATE 100 MG PO TABS: 100.0000 mg | ORAL_TABLET | Freq: Two times a day (BID) | ORAL | Status: DC

## 2016-03-10 MED ORDER — BUSPIRONE HCL 5 MG PO TABS: 5.0000 mg | ORAL_TABLET | Freq: Two times a day (BID) | ORAL | Status: DC

## 2016-03-10 MED FILL — busPIRone HCL 5 MG TABS: 5 | 30 days supply | Qty: 60 | Fill #0

## 2016-03-10 MED FILL — traMADol HCL 50 MG TABS: 50 | 10 days supply | Qty: 30 | Fill #0

## 2016-03-10 MED FILL — DOXYCYCLINE 100 MG TABLET: 100 | 10 days supply | Qty: 20 | Fill #0

## 2016-03-10 MED FILL — GABAPENTIN 300 MG CAPSULE: 300 | 30 days supply | Qty: 30 | Fill #0

## 2016-03-10 NOTE — Assessment & Plan Note (Signed)
Boils on mons Course of doxycyline

## 2016-03-10 NOTE — Progress Notes (Signed)
Establish Care  Low back pain  Pain with urination and frequency urination Pain scale #5 tobacco user 2-3 cigarette per day  No suicidal thoughts in the past two weeks

## 2016-03-10 NOTE — Assessment & Plan Note (Signed)
Hand paresthesias Possible carpal tunnel  Add gabapentin Continue tramadol. She is picking up Rx today Wrist braces  Continue ibuprofen prn

## 2016-03-10 NOTE — Patient Instructions (Addendum)
Kathy Conway was seen today for establish care.  Diagnoses and all orders for this visit:  Dysuria -     POCT urinalysis dipstick -     Urine culture  Paresthesia of both hands -     gabapentin (NEURONTIN) 300 MG capsule; Take 1 capsule (300 mg total) by mouth at bedtime.  Boils -     doxycycline (VIBRA-TABS) 100 MG tablet; Take 1 tablet (100 mg total) by mouth 2 (two) times daily.  Anxiety state -     busPIRone (BUSPAR) 5 MG tablet; Take 1 tablet (5 mg total) by mouth 2 (two) times daily.   Start Buspar in two weeks after you have started gabapentin.   [facility] [place].   F/u in 4 weeks for hand pain and numbness   Dr. Armen PickupFunches

## 2016-03-10 NOTE — Assessment & Plan Note (Signed)
Anxiety in setting of hx of mental illness Trial of buspar, low dose to start 1-2 weeks after starting tramadol and gabapentin

## 2016-03-10 NOTE — Progress Notes (Signed)
Subjective:  Patient ID: Kathy Conway, female    DOB: 1976/05/18  Age: 40 y.o. MRN: [id number]  CC: Establish Care   HPI Kathy Conway presents for    1. Hand numbness: bilateral. Started about 2 months ago. Numbness in hands with tingling. Also with pain radiating up arms. Feels a cramping pain. Comes and goes. Worse at a day of work. She works as a Public affairs consultant at [employer] Also noticing it at night. No rash. Skin is cracking and getting dry since dishwashing.   2. Dysuria: 6 days with frequency. Taking azo OTC that helps. Some nausea. No emesis. No fever or chills.   3. Bumps on mons: chronic. Comes and goes. Sometimes swell. She lances them at home and they drain blood. Tender now especially L upper mons.  4. Anxiety: chronic. Was treated with seroquel at [facility]. She gives hx of bipolar depression and borderline personality disorder. She did not like the treatment and evaluation at [facility]. She reports anxiety is more prevalent than depression. Anxiety is worse at work.   Social History  Substance Use Topics  . Smoking status: Current Every Day Smoker -- 0.50 packs/day    Types: Cigarettes  . Smokeless tobacco: Never Used  . Alcohol Use: No    Outpatient Prescriptions Prior to Visit  Medication Sig Dispense Refill  . furosemide (LASIX) 20 MG tablet Take 1 tablet (20 mg total) by mouth daily. 5 tablet 0  . ibuprofen (ADVIL,MOTRIN) 400 MG tablet Take 400 mg by mouth every 6 (six) hours as needed for mild pain. Reported on 02/11/2016    . naproxen (NAPROSYN) 250 MG tablet Take 250 mg by mouth 2 (two) times daily with a meal. Reported on 02/11/2016    . traMADol (ULTRAM) 50 MG tablet Take 1 tablet (50 mg total) by mouth every 8 (eight) hours as needed for moderate pain. 30 tablet 0   No facility-administered medications prior to visit.    ROS Review of Systems  Constitutional: Negative for fever and chills.  Eyes: Negative for visual disturbance.  Respiratory: Negative  for shortness of breath.   Cardiovascular: Negative for chest pain.  Gastrointestinal: Negative for abdominal pain and blood in stool.  Musculoskeletal: Positive for myalgias, back pain and arthralgias. Negative for joint swelling.  Skin: Positive for color change. Negative for rash.  Allergic/Immunologic: Negative for immunocompromised state.  Hematological: Negative for adenopathy. Does not bruise/bleed easily.  Psychiatric/Behavioral: Positive for dysphoric mood. Negative for suicidal ideas. The patient is nervous/anxious.     Objective:  BP 118/78 mmHg  Pulse 88  Temp(Src) 98.3 F (36.8 C) (Oral)  Resp 16  Ht  (1.676 m)  Wt 256 lb (116.121 kg)  BMI 41.34 kg/m2  SpO2 95%  BP/Weight 03/10/2016 02/11/2016 12/28/2015  Systolic BP 118 104 111  Diastolic BP 78 72 66  Wt. (Lbs) 256 254 260.5  BMI 41.34 41.02 -   Physical Exam  Constitutional: She is oriented to person, place, and time. She appears well-developed and well-nourished. No distress.  HENT:  Head: Normocephalic and atraumatic.  Cardiovascular: Normal rate, regular rhythm, normal heart sounds and intact distal pulses.   Pulses:      Radial pulses are 2+ on the right side, and 2+ on the left side.  Pulmonary/Chest: Effort normal and breath sounds normal.  Musculoskeletal: She exhibits no edema.       Right wrist: Normal.       Left wrist: Normal.  Right hand: Normal.       Left hand: Normal.  Neurological: She is alert and oriented to person, place, and time.  Skin: Skin is warm and dry. No rash noted.     Psychiatric: She has a normal mood and affect.   UA: small RBC, otherwise normal   Depression screen [facility] 2/9 03/10/2016 03/10/2016  Decreased Interest 0 0  Down, Depressed, Hopeless 0 0  PHQ - 2 Score 0 0  Altered sleeping 3 3  Tired, decreased energy 2 2  Change in appetite 2 2  Feeling bad or failure about yourself  0 0  Trouble concentrating 1 1  Moving slowly or fidgety/restless 0 0  Suicidal  thoughts 0 0  PHQ-9 Score 8 8   GAD 7 : Generalized Anxiety Score 03/10/2016  Nervous, Anxious, on Edge 1  Control/stop worrying 1  Worry too much - different things 1  Trouble relaxing 1  Restless 0  Easily annoyed or irritable 1  Afraid - awful might happen 0  Total GAD 7 Score 5      Assessment & Plan:   Markus Jarvisndria was seen today for establish care.  Diagnoses and all orders for this visit:  Dysuria -     POCT urinalysis dipstick -     Urine culture  Paresthesia of both hands -     gabapentin (NEURONTIN) 300 MG capsule; Take 1 capsule (300 mg total) by mouth at bedtime.  Boils -     doxycycline (VIBRA-TABS) 100 MG tablet; Take 1 tablet (100 mg total) by mouth 2 (two) times daily.  Anxiety state -     busPIRone (BUSPAR) 5 MG tablet; Take 1 tablet (5 mg total) by mouth 2 (two) times daily.    No orders of the defined types were placed in this encounter.    Follow-up: Return in about 4 weeks (around 04/07/2016).   Dessa PhiJosalyn Dontez Hauss MD

## 2016-03-13 LAB — URINE CULTURE

## 2016-03-15 ENCOUNTER — Telehealth: Payer: Self-pay | Admitting: *Deleted

## 2016-03-15 NOTE — Telephone Encounter (Signed)
LVM to return call.

## 2016-03-15 NOTE — Telephone Encounter (Signed)
-----   Message from Dessa PhiJosalyn Funches, MD sent at 03/14/2016  9:04 AM EDT ----- Kathy FischerUrina culture with E.coli but not enough colonies  to cause UTI When you call patient ask if she is having any urinary symptoms? If she is please send Macrobid 100 mg twice daily  for 7 days

## 2016-03-16 NOTE — Telephone Encounter (Signed)
Pt. Returning call.

## 2016-03-20 NOTE — Telephone Encounter (Signed)
Patient called back  returning nurse's call to review results, please f/up

## 2016-05-12 ENCOUNTER — Encounter (HOSPITAL_COMMUNITY): Payer: Self-pay | Admitting: Emergency Medicine

## 2016-05-12 ENCOUNTER — Emergency Department (HOSPITAL_COMMUNITY)
Admission: EM | Admit: 2016-05-12 | Discharge: 2016-05-12 | Disposition: A | Discharge status: Home / Self Care | Payer: Self-pay | Attending: Emergency Medicine | Admitting: Emergency Medicine

## 2016-05-12 ENCOUNTER — Emergency Department (HOSPITAL_COMMUNITY): Payer: Self-pay

## 2016-05-12 DIAGNOSIS — J45909 Unspecified asthma, uncomplicated: Secondary | ICD-10-CM | POA: Insufficient documentation

## 2016-05-12 DIAGNOSIS — F17210 Nicotine dependence, cigarettes, uncomplicated: Secondary | ICD-10-CM | POA: Insufficient documentation

## 2016-05-12 DIAGNOSIS — R109 Unspecified abdominal pain: Secondary | ICD-10-CM

## 2016-05-12 DIAGNOSIS — Z90710 Acquired absence of both cervix and uterus: Secondary | ICD-10-CM | POA: Insufficient documentation

## 2016-05-12 DIAGNOSIS — Z91040 Latex allergy status: Secondary | ICD-10-CM | POA: Insufficient documentation

## 2016-05-12 DIAGNOSIS — R110 Nausea: Secondary | ICD-10-CM

## 2016-05-12 DIAGNOSIS — R1084 Generalized abdominal pain: Secondary | ICD-10-CM | POA: Insufficient documentation

## 2016-05-12 DIAGNOSIS — Z79899 Other long term (current) drug therapy: Secondary | ICD-10-CM | POA: Insufficient documentation

## 2016-05-12 DIAGNOSIS — G8929 Other chronic pain: Secondary | ICD-10-CM | POA: Insufficient documentation

## 2016-05-12 LAB — URINALYSIS, ROUTINE W REFLEX MICROSCOPIC
BILIRUBIN URINE: NEGATIVE
GLUCOSE, UA: NEGATIVE mg/dL
Hgb urine dipstick: NEGATIVE
Ketones, ur: NEGATIVE mg/dL
Leukocytes, UA: NEGATIVE
NITRITE: NEGATIVE
PH: 7 (ref 5.0–8.0)
Protein, ur: NEGATIVE mg/dL
SPECIFIC GRAVITY, URINE: 1.022 (ref 1.005–1.030)

## 2016-05-12 LAB — COMPREHENSIVE METABOLIC PANEL
ALBUMIN: 3.1 g/dL — AB (ref 3.5–5.0)
ALT: 14 U/L (ref 14–54)
ANION GAP: 5 (ref 5–15)
AST: 20 U/L (ref 15–41)
Alkaline Phosphatase: 57 U/L (ref 38–126)
BUN: 14 mg/dL (ref 6–20)
CHLORIDE: 108 mmol/L (ref 101–111)
CO2: 26 mmol/L (ref 22–32)
Calcium: 8.9 mg/dL (ref 8.9–10.3)
Creatinine, Ser: 0.84 mg/dL (ref 0.44–1.00)
GFR calc Af Amer: >60 mL/min (ref >60)
GFR calc non Af Amer: >60 mL/min (ref >60)
GLUCOSE: 108 mg/dL — AB (ref 65–99)
POTASSIUM: 4 mmol/L (ref 3.5–5.1)
SODIUM: 139 mmol/L (ref 135–145)
TOTAL PROTEIN: 5.3 g/dL — AB (ref 6.5–8.1)
Total Bilirubin: 0.3 mg/dL (ref 0.3–1.2)

## 2016-05-12 LAB — CBC
HEMATOCRIT: 42 % (ref 36.0–46.0)
HEMOGLOBIN: 13.9 g/dL (ref 12.0–15.0)
MCH: 29.6 pg (ref 26.0–34.0)
MCHC: 33.1 g/dL (ref 30.0–36.0)
MCV: 89.6 fL (ref 78.0–100.0)
Platelets: 204 10*3/uL (ref 150–400)
RBC: 4.69 MIL/uL (ref 3.87–5.11)
RDW: 13.6 % (ref 11.5–15.5)
WBC: 11.8 10*3/uL — ABNORMAL HIGH (ref 4.0–10.5)

## 2016-05-12 LAB — LIPASE, BLOOD: LIPASE: 33 U/L (ref 11–51)

## 2016-05-12 MED ORDER — DICYCLOMINE HCL 20 MG PO TABS: 20.0000 mg | ORAL_TABLET | Freq: Three times a day (TID) | ORAL | 0 refills | Status: DC

## 2016-05-12 MED ORDER — KETOROLAC TROMETHAMINE 30 MG/ML IJ SOLN
30.0000 mg | Freq: Once | INTRAMUSCULAR | Status: AC
  Administered 2016-05-12: 30 mg via INTRAVENOUS
  Filled 2016-05-12: qty 1

## 2016-05-12 MED ORDER — DICYCLOMINE HCL 10 MG PO CAPS
10.0000 mg | ORAL_CAPSULE | Freq: Once | ORAL | Status: AC
  Administered 2016-05-12: 10 mg via ORAL
  Filled 2016-05-12: qty 1

## 2016-05-12 MED ORDER — IOPAMIDOL (ISOVUE-300) INJECTION 61%
INTRAVENOUS | Status: AC
  Administered 2016-05-12: 100 mL
  Filled 2016-05-12: qty 100

## 2016-05-12 MED ORDER — SODIUM CHLORIDE 0.9 % IV BOLUS (SEPSIS)
1000.0000 mL | Freq: Once | INTRAVENOUS | Status: AC
  Administered 2016-05-12: 1000 mL via INTRAVENOUS

## 2016-05-12 MED FILL — DICYCLOMINE 20 MG TABLET: 20 | 10 days supply | Qty: 40 | Fill #0

## 2016-05-12 NOTE — ED Provider Notes (Signed)
Assumed care from Dr. Patria Mane at 7 AM.  Briefly, the patient is a 40 year old female with past medical history of irritable bowel who presents with acute on chronic abdominal pain. Labwork is overall reassuring. Her vital signs are stable within normal limits. Patient currently his PCP appointment scheduled in the near future. CT scan is pending at this time. Suspect patient can be discharged pending CT.  CT scan returned and is negative. Patient is well-appearing on my examination. Her abdomen is soft and nontender with no distention. I suspect patient has ongoing acute on chronic IBS. Will start patient on Bentyl and have her follow-up. Return precautions given in detail.     Shaune Pollack, MD 05/12/16 [phone]

## 2016-05-12 NOTE — ED Triage Notes (Signed)
Pt. reports chronic low abdominal pain for 3 years worse this week with nausea and diarrhea , no fever or emesis .

## 2016-05-12 NOTE — ED Notes (Signed)
Patient transported to CT 

## 2016-05-12 NOTE — ED Notes (Signed)
IV d/c.. Not charted

## 2016-05-12 NOTE — ED Provider Notes (Signed)
[facility]-EMERGENCY DEPT Provider Note   CSN: [id number] Arrival date & time: 05/12/16  0009  First Provider Contact:  First MD Initiated Contact with Patient 05/12/16 0253    By signing my name below, I, Levon Hedger, attest that this documentation has been prepared under the direction and in the presence of Azalia Bilis, MD . Electronically Signed: Levon Hedger, Scribe. 05/12/2016. 3:13 AM.  History   Chief Complaint Chief Complaint  Patient presents with  . Abdominal Pain    HPI Kathy Conway is a 40 y.o. female with PMHx of IBS, who presents to the Emergency Department complaining of constant, worsening, aching diffuse abdominal pain onset two months ago. No alleviating factors noted. Pt states she has taken imodium and changed her diet with no relief. Pt states she has had chronic abdominal pain for three years. Pt notes associated back pain, nausea and diarrhea. She [facility]. Pt has PSHx of hysterectomy and cholecystectomy.    The history is provided by the patient. No language interpreter was used.    Past Medical History:  Diagnosis Date  . Asthma    at age 89  . Fibromyalgia   . H/O degenerative disc disease   . Migraine   . Renal disorder     Patient Active Problem List   Diagnosis Date Noted  . Paresthesia of both hands 03/10/2016  . Boils 03/10/2016  . Anxiety state 03/10/2016  . H/O degenerative disc disease   . Fibromyalgia     Past Surgical History:  Procedure Laterality Date  . ABDOMINAL HYSTERECTOMY    . CHOLECYSTECTOMY    . TUBAL LIGATION      OB History    Gravida Para Term Preterm AB Living   SAB TAB Ectopic Multiple Live Births                   Home Medications    Prior to Admission medications   Medication Sig Start Date End Date Taking? Authorizing Provider  busPIRone (BUSPAR) 5 MG tablet Take 1 tablet (5 mg total) by mouth 2 (two) times daily. 03/10/16   Josalyn  Funches, MD  doxycycline (VIBRA-TABS) 100 MG tablet Take 1 tablet (100 mg total) by mouth 2 (two) times daily. 03/10/16   Josalyn Funches, MD  furosemide (LASIX) 20 MG tablet Take 1 tablet (20 mg total) by mouth daily. 12/28/15 01/01/16  Earley Favor, NP  gabapentin (NEURONTIN) 300 MG capsule Take 1 capsule (300 mg total) by mouth at bedtime. 03/10/16   Josalyn Funches, MD  ibuprofen (ADVIL,MOTRIN) 400 MG tablet Take 400 mg by mouth every 6 (six) hours as needed for mild pain. Reported on 02/11/2016    Historical Provider, MD  naproxen (NAPROSYN) 250 MG tablet Take 250 mg by mouth 2 (two) times daily with a meal. Reported on 02/11/2016    Historical Provider, MD  traMADol (ULTRAM) 50 MG tablet Take 1 tablet (50 mg total) by mouth every 8 (eight) hours as needed for moderate pain. 02/11/16   Vivianne Master, PA-C    Family History Family History  Problem Relation Age of Onset  . Cancer Mother   . Cancer Father   . Diabetes Maternal Grandfather     Social History Social History  Substance Use Topics  . Smoking status: Current Every Day Smoker    Packs/day: 0.50    Types: Cigarettes  . Smokeless  tobacco: Never Used  . Alcohol use No     Allergies   Codeine; Vicodin [hydrocodone-acetaminophen]; and Latex   Review of Systems Review of Systems 10 Systems reviewed and are negative for acute change except as noted in the HPI.  Physical Exam Updated Vital Signs BP (!) 148/104   Pulse 89   Temp 98.9 F (37.2 C) (Oral)   Resp 14   SpO2 99%   Physical Exam  Constitutional: She is oriented to person, place, and time. She appears well-developed and well-nourished. No distress.  HENT:  Head: Normocephalic and atraumatic.  Eyes: EOM are normal.  Neck: Normal range of motion.  Cardiovascular: Normal rate, regular rhythm and normal heart sounds.   Pulmonary/Chest: Effort normal and breath sounds normal.  Abdominal: Soft. She exhibits no distension. There is tenderness.  Mild generalized  tenderness  Musculoskeletal: Normal range of motion.  Neurological: She is alert and oriented to person, place, and time.  Skin: Skin is warm and dry.  Psychiatric: She has a normal mood and affect. Judgment normal.  Nursing note and vitals reviewed.    ED Treatments / Results  DIAGNOSTIC STUDIES:  Oxygen Saturation is 99% on RA, normal by my interpretation.    COORDINATION OF CARE:  3:11 AM Will order CT abdomen. Discussed treatment plan which includes Toradol with pt at bedside and pt agreed to plan.  Labs (all labs ordered are listed, but only abnormal results are displayed) Labs Reviewed  COMPREHENSIVE METABOLIC PANEL - Abnormal; Notable for the following:       Result Value   Glucose, Bld 108 (*)    Total Protein 5.3 (*)    Albumin 3.1 (*)    All other components within normal limits  CBC - Abnormal; Notable for the following:    WBC 11.8 (*)    All other components within normal limits  URINALYSIS, ROUTINE W REFLEX MICROSCOPIC (NOT AT [facility]) - Abnormal; Notable for the following:    APPearance CLOUDY (*)    All other components within normal limits  LIPASE, BLOOD    EKG  EKG Interpretation None       Radiology No results found.  Procedures Procedures (including critical care time)  Medications Ordered in ED Medications - No data to display   Initial Impression / Assessment and Plan / ED Course  I have reviewed the triage vital signs and the nursing notes.  Pertinent labs & imaging results that were available during my care of the patient were reviewed by me and considered in my medical decision making (see chart for details).  Clinical Course   Ongoing persistent abdominal pain.  She does have a history of IBS and this may be her IBS.  She is scheduled [facility] with [facility] in one to 2 weeks.  She will also need GI follow-up.  CT scan pending at this time to evaluate for intra-abdominal pathology.  Care to Dr Penne Lash to follow up on  imaging   Final Clinical Impressions(s) / ED Diagnoses   Final diagnoses:  None     I personally performed the services described in this documentation, which was scribed in my presence. The recorded information has been reviewed and is accurate.     New Prescriptions New Prescriptions   No medications on file     Azalia Bilis, MD 05/12/16 [phone]

## 2016-05-12 NOTE — ED Notes (Signed)
Pt is in stable condition upon d/c and ambulates from ED. 

## 2016-05-12 NOTE — ED Notes (Signed)
Called CT to see when pt will go to ct.

## 2016-05-23 ENCOUNTER — Encounter: Payer: Self-pay | Admitting: Family Medicine

## 2016-05-23 ENCOUNTER — Ambulatory Visit: Payer: Self-pay | Attending: Family Medicine | Admitting: Family Medicine

## 2016-05-23 DIAGNOSIS — R42 Dizziness and giddiness: Secondary | ICD-10-CM | POA: Insufficient documentation

## 2016-05-23 DIAGNOSIS — J45909 Unspecified asthma, uncomplicated: Secondary | ICD-10-CM | POA: Insufficient documentation

## 2016-05-23 DIAGNOSIS — F17210 Nicotine dependence, cigarettes, uncomplicated: Secondary | ICD-10-CM | POA: Insufficient documentation

## 2016-05-23 DIAGNOSIS — K589 Irritable bowel syndrome without diarrhea: Secondary | ICD-10-CM | POA: Insufficient documentation

## 2016-05-23 DIAGNOSIS — Z79899 Other long term (current) drug therapy: Secondary | ICD-10-CM | POA: Insufficient documentation

## 2016-05-23 DIAGNOSIS — G5603 Carpal tunnel syndrome, bilateral upper limbs: Secondary | ICD-10-CM | POA: Insufficient documentation

## 2016-05-23 DIAGNOSIS — G43901 Migraine, unspecified, not intractable, with status migrainosus: Secondary | ICD-10-CM | POA: Insufficient documentation

## 2016-05-23 DIAGNOSIS — F419 Anxiety disorder, unspecified: Secondary | ICD-10-CM | POA: Insufficient documentation

## 2016-05-23 MED ORDER — MECLIZINE HCL 25 MG PO TABS: 25.0000 mg | ORAL_TABLET | Freq: Three times a day (TID) | ORAL | 2 refills | Status: DC | PRN

## 2016-05-23 MED ORDER — ONDANSETRON HCL 4 MG PO TABS
8.0000 mg | ORAL_TABLET | Freq: Once | ORAL | Status: AC
  Administered 2016-05-23: 8 mg via ORAL

## 2016-05-23 MED ORDER — DIPHENOXYLATE-ATROPINE 2.5-0.025 MG PO TABS: 1.0000 | ORAL_TABLET | Freq: Four times a day (QID) | ORAL | 0 refills | Status: DC | PRN

## 2016-05-23 MED ORDER — DIPHENHYDRAMINE HCL 25 MG PO CAPS
50.0000 mg | ORAL_CAPSULE | Freq: Once | ORAL | Status: AC
  Administered 2016-05-23: 50 mg via ORAL

## 2016-05-23 MED ORDER — KETOROLAC TROMETHAMINE 60 MG/2ML IM SOLN
60.0000 mg | Freq: Once | INTRAMUSCULAR | Status: AC
  Administered 2016-05-23: 60 mg via INTRAMUSCULAR

## 2016-05-23 NOTE — Patient Instructions (Addendum)
Markus Jarvisndria was seen today for follow-up, follow-up and migraine.  Diagnoses and all orders for this visit:  IBS (irritable bowel syndrome) -     diphenoxylate-atropine (LOMOTIL) 2.5-0.025 MG tablet; Take 1 tablet by mouth 4 (four) times daily as needed for diarrhea or loose stools.  Migraine with status migrainosus, not intractable, unspecified migraine type -     ondansetron (ZOFRAN) tablet 8 mg; Take 2 tablets (8 mg total) by mouth once. -     diphenhydrAMINE (BENADRYL) capsule 50 mg; Take 2 capsules (50 mg total) by mouth once. -     ketorolac (TORADOL) injection 60 mg; Inject 2 mLs (60 mg total) into the muscle once.  Vertigo -     meclizine (ANTIVERT) 25 MG tablet; Take 1 tablet (25 mg total) by mouth 3 (three) times daily as needed for dizziness.   Low out cost optometrist (about $65.00 for office visit)  1. Dr. Wynona LunaSteven Bernstorf Phone # [phone] [address]  [place], [place] [address]   2. [facility]  Phone # [phone] [address] [place], [place] [address]   Smoking cessation support: smoking cessation hotline: 1-800-QUIT-NOW.  Smoking cessation classes are available through [facility] and [facility]. Call [phone] or visit our website at [email or web address].   F/u in 6 weeks for IBS and migraines  Dr. Armen PickupFunches

## 2016-05-23 NOTE — Progress Notes (Signed)
Subjective:  Patient ID: Kathy Conway, female    DOB: 08-24-1976  Age: 40 y.o. MRN: [id number]  CC: Follow-up (Handpain and numbness   - better); Follow-up (ED - Abdominal pain - no better); and Migraine (x 2 dys blurried vision; balance off)   HPI Kathy Conway has hx of IBS, anxiety, carpal tunnel in hands presents for   1. ED f/u abdominal pain: having watery stools and nausea. LLQ pain. No emesis. Bentyl has not helped with stool frequency. Denies blood in stool and constipation. Reports hx of IBS since 2005.   2. Headache: x 2 days. With blurry vision and feeling balance is off.  Pain coming up from L shoulder blade up to L side of face and jaw. With sharp pain in ears. Pressure in head, 8/10. Having nausea. Reports hx of migraine since 1998.   3. Carpal tunnel: better. She stopped working a [employer]. She is now working with [employer].   Past Medical History:  Diagnosis Date  . Asthma 10/1993  . Fibromyalgia   . H/O degenerative disc disease   . IBS (irritable bowel syndrome) 10/2003  . Migraine 10/1996  . Renal disorder   . Vertigo 10/2009   Social History  Substance Use Topics  . Smoking status: Current Every Day Smoker    Packs/day: 0.50    Types: Cigarettes  . Smokeless tobacco: Never Used  . Alcohol use No    Outpatient Medications Prior to Visit  Medication Sig Dispense Refill  . busPIRone (BUSPAR) 5 MG tablet Take 1 tablet (5 mg total) by mouth 2 (two) times daily. (Patient taking differently: Take 5 mg by mouth 2 (two) times daily as needed (anxiety). ) 60 tablet 0  . cholecalciferol (VITAMIN D) 1000 units tablet Take 2,000 Units by mouth daily.    Marland Kitchen. dicyclomine (BENTYL) 20 MG tablet Take 1 tablet (20 mg total) by mouth 4 (four) times daily -  before meals and at bedtime. 40 tablet 0  . ibuprofen (ADVIL,MOTRIN) 400 MG tablet Take 400 mg by mouth every 6 (six) hours as needed for mild pain. Reported on 02/11/2016    . MELATONIN PO Take 2-3 tablets by mouth at  bedtime as needed (sleep). gummys     No facility-administered medications prior to visit.     ROS Review of Systems  Constitutional: Negative for chills and fever.  HENT: Positive for ear pain.   Eyes: Positive for visual disturbance.  Respiratory: Negative for shortness of breath.   Cardiovascular: Negative for chest pain.  Gastrointestinal: Positive for diarrhea and nausea. Negative for abdominal distention, abdominal pain, anal bleeding, blood in stool, constipation, rectal pain and vomiting.  Musculoskeletal: Positive for arthralgias, back pain and myalgias. Negative for joint swelling.  Skin: Positive for color change. Negative for rash.  Allergic/Immunologic: Negative for immunocompromised state.  Neurological: Positive for dizziness and headaches. Negative for tremors, seizures, syncope, facial asymmetry, speech difficulty, weakness, light-headedness and numbness.  Hematological: Negative for adenopathy. Does not bruise/bleed easily.  Psychiatric/Behavioral: Positive for dysphoric mood. Negative for suicidal ideas. The patient is nervous/anxious.     Objective:  BP 127/86 (BP Location: Right Arm, Patient Position: Sitting, Cuff Size: Large)   Pulse 91   Temp 98.5 F (36.9 C) (Oral)   Wt 248 lb 3.2 oz (112.6 kg)   BMI 40.06 kg/m   BP/Weight 05/23/2016 05/12/2016 03/10/2016  Systolic BP 127 121 118  Diastolic BP 86 72 78  Wt. (Lbs) 248.2 - 256  BMI 40.06 -  41.34   Physical Exam  Constitutional: She is oriented to person, place, and time. She appears well-developed and well-nourished. No distress.  HENT:  Head: Normocephalic and atraumatic.  Right Ear: Hearing, tympanic membrane, external ear and ear canal normal.  Left Ear: Hearing, tympanic membrane, external ear and ear canal normal.  Nose: Nose normal.  Mouth/Throat: Oropharynx is clear and moist.  Eyes: Conjunctivae and EOM are normal. Pupils are equal, round, and reactive to light.  Cardiovascular: Normal rate,  regular rhythm, normal heart sounds and intact distal pulses.   Pulmonary/Chest: Effort normal and breath sounds normal.  Musculoskeletal: She exhibits no edema.  Neurological: She is alert and oriented to person, place, and time. No cranial nerve deficit. She exhibits normal muscle tone. Coordination normal.  Skin: Skin is warm and dry. No rash noted.  Psychiatric: She has a normal mood and affect.   Gave migraine cocktail of zofran 8 mg PO x one, toradol 60 mg IM x one, and benardyl 50 mg PO x one .    Assessment & Plan:  Kathy Conway was seen today for follow-up, follow-up and migraine.  Diagnoses and all orders for this visit:  IBS (irritable bowel syndrome) -     diphenoxylate-atropine (LOMOTIL) 2.5-0.025 MG tablet; Take 1 tablet by mouth 4 (four) times daily as needed for diarrhea or loose stools.  Migraine with status migrainosus, not intractable, unspecified migraine type -     ondansetron (ZOFRAN) tablet 8 mg; Take 2 tablets (8 mg total) by mouth once. -     diphenhydrAMINE (BENADRYL) capsule 50 mg; Take 2 capsules (50 mg total) by mouth once. -     ketorolac (TORADOL) injection 60 mg; Inject 2 mLs (60 mg total) into the muscle once.  Vertigo -     meclizine (ANTIVERT) 25 MG tablet; Take 1 tablet (25 mg total) by mouth 3 (three) times daily as needed for dizziness.   There are no diagnoses linked to this encounter.  No orders of the defined types were placed in this encounter.   Follow-up: No Follow-up on file.   Dessa PhiJosalyn Darlene Brozowski MD

## 2016-05-23 NOTE — Progress Notes (Signed)
Pt is here today as follow up for hand pain and numbness. Pt states she has quit her job which was causing her hand pain and numbness which has since gotten better. Pt states she was seen at the emergency room on 05/12/16 for abdominal pain - prescribed medication for IBS but states the pain or diarrhea is no better. Pt also states, she has had a migraine, blurred vision causing her balance to be off for the past two days. Pt denies SOB, chest pain and fever.

## 2016-06-01 ENCOUNTER — Emergency Department (HOSPITAL_COMMUNITY)
Admission: EM | Admit: 2016-06-01 | Discharge: 2016-06-01 | Disposition: A | Discharge status: Home / Self Care | Payer: Medicaid - Out of State | Attending: Emergency Medicine | Admitting: Emergency Medicine

## 2016-06-01 ENCOUNTER — Encounter (HOSPITAL_COMMUNITY): Payer: Self-pay | Admitting: Emergency Medicine

## 2016-06-01 DIAGNOSIS — N760 Acute vaginitis: Secondary | ICD-10-CM | POA: Insufficient documentation

## 2016-06-01 DIAGNOSIS — Z91040 Latex allergy status: Secondary | ICD-10-CM | POA: Insufficient documentation

## 2016-06-01 DIAGNOSIS — B9689 Other specified bacterial agents as the cause of diseases classified elsewhere: Secondary | ICD-10-CM | POA: Insufficient documentation

## 2016-06-01 DIAGNOSIS — J45909 Unspecified asthma, uncomplicated: Secondary | ICD-10-CM | POA: Insufficient documentation

## 2016-06-01 DIAGNOSIS — F17210 Nicotine dependence, cigarettes, uncomplicated: Secondary | ICD-10-CM | POA: Insufficient documentation

## 2016-06-01 LAB — URINALYSIS, ROUTINE W REFLEX MICROSCOPIC
BILIRUBIN URINE: NEGATIVE
Glucose, UA: NEGATIVE mg/dL
KETONES UR: NEGATIVE mg/dL
Leukocytes, UA: NEGATIVE
Nitrite: NEGATIVE
Protein, ur: NEGATIVE mg/dL
SPECIFIC GRAVITY, URINE: 1.023 (ref 1.005–1.030)
pH: 6 (ref 5.0–8.0)

## 2016-06-01 LAB — WET PREP, GENITAL
SPERM: NONE SEEN
TRICH WET PREP: NONE SEEN
YEAST WET PREP: NONE SEEN

## 2016-06-01 LAB — URINE MICROSCOPIC-ADD ON

## 2016-06-01 LAB — I-STAT BETA HCG BLOOD, ED (MC, WL, AP ONLY): I-stat hCG, quantitative: <5 m[IU]/mL

## 2016-06-01 MED ORDER — LIDOCAINE HCL 2 % EX GEL
1.0000 "application " | Freq: Once | CUTANEOUS | Status: AC
  Administered 2016-06-01: 1 via TOPICAL
  Filled 2016-06-01: qty 20

## 2016-06-01 MED ORDER — METRONIDAZOLE 500 MG PO TABS: 500.0000 mg | ORAL_TABLET | Freq: Two times a day (BID) | ORAL | 0 refills | Status: DC

## 2016-06-01 NOTE — ED Provider Notes (Signed)
[facility]-EMERGENCY DEPT Provider Note   CSN: [id number] Arrival date & time: 06/01/16  1905     History   Chief Complaint Chief Complaint  Patient presents with  . Vaginal Pain    HPI Kathy Conway is a 40 y.o. female.  HPI   Patient has PMH of asthma, fibromyalgia, migraines, IBS, renal disorder and vertigo.   Patient complains of a "piece of meat sticking out of her vagina". It started hurting 3 days ago. She denies having any discharge or abnormal bleeding. She still has a cervix but she no longer has a uterus. She denies that this has happened before. It hurts to pee therefore she was retaining urine. She describes it as a burning and stinging sensation. No abdominal pain, N/V/D, fevers, back pain, CVA pain, weakness, confusion, syncope.  Past Medical History:  Diagnosis Date  . Asthma 10/1993  . Fibromyalgia   . H/O degenerative disc disease   . IBS (irritable bowel syndrome) 10/2003  . Migraine 10/1996  . Renal disorder   . Vertigo 10/2009    Patient Active Problem List   Diagnosis Date Noted  . IBS (irritable bowel syndrome) 05/23/2016  . Asthma   . Paresthesia of both hands 03/10/2016  . Boils 03/10/2016  . Anxiety state 03/10/2016  . H/O degenerative disc disease   . Fibromyalgia   . Vertigo 10/09/2009  . Migraine 10/09/1996    Past Surgical History:  Procedure Laterality Date  . ABDOMINAL HYSTERECTOMY    . CHOLECYSTECTOMY    . TUBAL LIGATION      OB History    Gravida Para Term Preterm AB Living   2 2       2    SAB TAB Ectopic Multiple Live Births                   Home Medications    Prior to Admission medications   Medication Sig Start Date End Date Taking? Authorizing Provider  Acetaminophen-Caffeine (EXCEDRIN TENSION HEADACHE) 500-65 MG TABS Take 650 mg by mouth as needed.   Yes Historical Provider, MD  busPIRone (BUSPAR) 5 MG tablet Take 1 tablet (5 mg total) by mouth 2 (two) times daily. Patient taking differently: Take 5 mg by mouth  2 (two) times daily as needed (anxiety).  03/10/16  Yes Josalyn Funches, MD  cholecalciferol (VITAMIN D) 1000 units tablet Take 2,000 Units by mouth daily.   Yes Historical Provider, MD  gabapentin (NEURONTIN) 300 MG capsule Take 300 mg by mouth at bedtime. 03/10/16  Yes Historical Provider, MD  ibuprofen (ADVIL,MOTRIN) 200 MG tablet Take 800 mg by mouth every 8 (eight) hours as needed for moderate pain.   Yes Historical Provider, MD  MELATONIN PO Take 2 tablets by mouth at bedtime as needed (sleep). gummys    Yes Historical Provider, MD  vitamin B-12 (CYANOCOBALAMIN) 1000 MCG tablet Take 2,000 mcg by mouth daily. Gummies   Yes Historical Provider, MD  diphenoxylate-atropine (LOMOTIL) 2.5-0.025 MG tablet Take 1 tablet by mouth 4 (four) times daily as needed for diarrhea or loose stools. 05/23/16   Dessa Phi, MD  meclizine (ANTIVERT) 25 MG tablet Take 1 tablet (25 mg total) by mouth 3 (three) times daily as needed for dizziness. Patient taking differently: Take 25 mg by mouth 3 (three) times daily as needed for dizziness (for vertigo).  05/23/16   Josalyn Funches, MD  metroNIDAZOLE (FLAGYL) 500 MG tablet Take 1 tablet (500 mg total) by mouth 2 (two) times daily. 06/01/16  Marlon Peliffany Ansen Sayegh, PA-C    Family History Family History  Problem Relation Age of Onset  . Cancer Mother   . Cancer Father   . Diabetes Maternal Grandfather     Social History Social History  Substance Use Topics  . Smoking status: Current Every Day Smoker    Packs/day: 0.50    Types: Cigarettes  . Smokeless tobacco: Never Used  . Alcohol use No     Allergies   Codeine; Vicodin [hydrocodone-acetaminophen]; and Latex   Review of Systems Review of Systems  Review of Systems All other systems negative except as documented in the HPI. All pertinent positives and negatives as reviewed in the HPI.   Physical Exam Updated Vital Signs BP 123/82   Pulse 91   Temp 98.3 F (36.8 C) (Oral)   Resp 16   Ht 5\' 6"  (1.676  m)   Wt 112.5 kg   SpO2 97%   BMI 40.03 kg/m   Physical Exam  Constitutional: She appears well-developed and well-nourished.  HENT:  Head: Normocephalic and atraumatic.  Eyes: Pupils are equal, round, and reactive to light.  Neck: Trachea normal, normal range of motion and full passive range of motion without pain. Neck supple.  Cardiovascular: Normal rate, regular rhythm and normal pulses.   Pulmonary/Chest: Effort normal and breath sounds normal. Chest wall is not dull to percussion. She exhibits no tenderness, no crepitus, no edema, no deformity and no retraction.  Abdominal: Soft. Normal appearance and bowel sounds are normal.  Genitourinary: Cervix exhibits no motion tenderness and no discharge. Right adnexum displays no tenderness. Left adnexum displays no tenderness. There is erythema and tenderness in the vagina. No bleeding in the vagina. No foreign body in the vagina. No signs of injury around the vagina. No vaginal discharge found.  Genitourinary Comments: Small skin tag, irritation to labia minora and vaginal canal. No bleeding. No discharge noted. No obvious rash or lesions. Uterus is surgically absent  Musculoskeletal: Normal range of motion.  Neurological: She is alert. She has normal strength.  Skin: Skin is warm, dry and intact.  Psychiatric: She has a normal mood and affect. Her speech is normal and behavior is normal. Judgment and thought content normal. Cognition and memory are normal.     ED Treatments / Results  Labs (all labs ordered are listed, but only abnormal results are displayed) Labs Reviewed  WET PREP, GENITAL - Abnormal; Notable for the following:       Result Value   Clue Cells Wet Prep HPF POC PRESENT (*)    WBC, Wet Prep HPF POC FEW (*)    All other components within normal limits  URINALYSIS, ROUTINE W REFLEX MICROSCOPIC (NOT AT [facility]) - Abnormal; Notable for the following:    Hgb urine dipstick TRACE (*)    All other components within normal  limits  URINE MICROSCOPIC-ADD ON - Abnormal; Notable for the following:    Squamous Epithelial / LPF 0-5 (*)    Bacteria, UA RARE (*)    All other components within normal limits  I-STAT BETA HCG BLOOD, ED ([facility], [facility], [facility] ONLY)  GC/CHLAMYDIA PROBE AMP ([place]) NOT AT [facility]    EKG  EKG Interpretation None       Radiology No results found.  Procedures Procedures (including critical care time)  Medications Ordered in ED Medications  lidocaine (XYLOCAINE) 2 % jelly 1 application (1 application Topical Given 06/01/16 2250)     Initial Impression / Assessment and Plan / ED Course  I have reviewed the triage vital signs and the nursing notes.  Pertinent labs & imaging results that were available during my care of the patient were reviewed by me and considered in my medical decision making (see chart for details).  Clinical Course    Topical lidocaine given for pain control, pt now has no pain. She is positive for BV, given flagyl Rx. GC sent out likely will be negative. Referral to PCP and [facility].  Final Clinical Impressions(s) / ED Diagnoses   Final diagnoses:  BV (bacterial vaginosis)    New Prescriptions New Prescriptions   METRONIDAZOLE (FLAGYL) 500 MG TABLET    Take 1 tablet (500 mg total) by mouth 2 (two) times daily.     Marlon Peliffany Clotilda Hafer, PA-C 06/01/16 2314    Mancel BaleElliott Wentz, MD 06/01/16 [phone]

## 2016-06-01 NOTE — ED Triage Notes (Signed)
Pt. reports vaginal pain onset 2 days ago , denies injury , mild swelling with " skin tag " , denies vaginal discharge or dysuria .

## 2016-06-01 NOTE — ED Notes (Signed)
Patient Alert and oriented X4. Stable and ambulatory. Patient verbalized understanding of the discharge instructions.  Patient belongings were taken by the patient.  

## 2016-06-02 LAB — GC/CHLAMYDIA PROBE AMP (~~LOC~~) NOT AT ARMC
CHLAMYDIA, DNA PROBE: NEGATIVE
NEISSERIA GONORRHEA: NEGATIVE

## 2016-07-17 ENCOUNTER — Emergency Department (HOSPITAL_COMMUNITY): Payer: Medicaid - Out of State

## 2016-07-17 ENCOUNTER — Emergency Department (HOSPITAL_COMMUNITY)
Admission: EM | Admit: 2016-07-17 | Discharge: 2016-07-17 | Disposition: A | Discharge status: Home / Self Care | Payer: Medicaid - Out of State | Attending: Emergency Medicine | Admitting: Emergency Medicine

## 2016-07-17 ENCOUNTER — Encounter (HOSPITAL_COMMUNITY): Payer: Self-pay

## 2016-07-17 DIAGNOSIS — Z91040 Latex allergy status: Secondary | ICD-10-CM | POA: Insufficient documentation

## 2016-07-17 DIAGNOSIS — J45909 Unspecified asthma, uncomplicated: Secondary | ICD-10-CM | POA: Insufficient documentation

## 2016-07-17 DIAGNOSIS — M7732 Calcaneal spur, left foot: Secondary | ICD-10-CM

## 2016-07-17 DIAGNOSIS — F17210 Nicotine dependence, cigarettes, uncomplicated: Secondary | ICD-10-CM | POA: Insufficient documentation

## 2016-07-17 MED ORDER — OXYCODONE-ACETAMINOPHEN 5-325 MG PO TABS
1.0000 | ORAL_TABLET | Freq: Once | ORAL | Status: AC
  Administered 2016-07-17: 1 via ORAL
  Filled 2016-07-17: qty 1

## 2016-07-17 MED ORDER — DICLOFENAC SODIUM 50 MG PO TBEC: 50.0000 mg | DELAYED_RELEASE_TABLET | Freq: Two times a day (BID) | ORAL | 0 refills | Status: DC

## 2016-07-17 MED ORDER — TRAMADOL HCL 50 MG PO TABS: 50.0000 mg | ORAL_TABLET | Freq: Four times a day (QID) | ORAL | 0 refills | Status: DC | PRN

## 2016-07-17 NOTE — ED Notes (Signed)
Pt showing NAD. RR even and unlabored. Significant other at bedside. Voices no question/concerns at this time.

## 2016-07-17 NOTE — ED Provider Notes (Signed)
[facility]-EMERGENCY DEPT Provider Note   CSN: [id number] Arrival date & time: 07/17/16  1857  By signing my name below, I, Soijett Blue, attest that this documentation has been prepared under the direction and in the presence of Kerrie BuffaloHope Fawn Desrocher, NP Electronically Signed: Soijett Blue, ED Scribe. 07/17/16. 7:38 PM.   History   Chief Complaint Chief Complaint  Patient presents with  . Back Pain  . Foot Pain    HPI  Kathy Conway is a 40 y.o. female with a PMHx of DDD, who presents to the Emergency Department complaining of throbbing left foot pain onset 2 days. Pt states that she stands on her feet for prolonged periods while at work. Pt notes that her left foot pain is worsened with ambulation and she denies alleviating factors for her left foot pain. Pt denies left foot pain in the past. Pt denies seeing an orthopedist or having an xray for her bilateral foot pain. Pt is having associated symptoms of gait problem due to pain. She notes that she has tried ibuprofen for the relief of her symptoms. She denies color change, wound, rash, swelling, and any other symptoms.   Pt secondarily complains of chronic lower back pain onset 2 days. Pt states that she has chronic back pain with sciatica and she is not as concerned about her lower back pain at this time. Pt notes that she is primarily concerned for her left foot pain. She reports that the back pain does radiate to her left leg and she describes the pain as pinching sensation. Pt notes that she has been seen by an orthopedist for her back pain and she was getting injections for. Pt denies recent orthopedist visits. Pt reports that she has been taking ibuprofen for the relief of her lower back pain. Pt denies any other symptoms.    The history is provided by the patient. No language interpreter was used.  Foot Pain  This is a recurrent problem. The current episode started 2 days ago. The problem occurs constantly. The problem has not changed since  onset.The symptoms are aggravated by walking. Nothing relieves the symptoms. Treatments tried: ibuprofen.    Past Medical History:  Diagnosis Date  . Asthma 10/1993  . Fibromyalgia   . H/O degenerative disc disease   . IBS (irritable bowel syndrome) 10/2003  . Migraine 10/1996  . Renal disorder   . Vertigo 10/2009    Patient Active Problem List   Diagnosis Date Noted  . IBS (irritable bowel syndrome) 05/23/2016  . Asthma   . Paresthesia of both hands 03/10/2016  . Boils 03/10/2016  . Anxiety state 03/10/2016  . H/O degenerative disc disease   . Fibromyalgia   . Vertigo 10/09/2009  . Migraine 10/09/1996    Past Surgical History:  Procedure Laterality Date  . ABDOMINAL HYSTERECTOMY    . CHOLECYSTECTOMY    . TUBAL LIGATION      OB History    Gravida Para Term Preterm AB Living   2 2       2    SAB TAB Ectopic Multiple Live Births                   Home Medications    Prior to Admission medications   Medication Sig Start Date End Date Taking? Authorizing Provider  Acetaminophen-Caffeine (EXCEDRIN TENSION HEADACHE) 500-65 MG TABS Take 650 mg by mouth as needed.    Historical Provider, MD  busPIRone (BUSPAR) 5 MG tablet Take 1 tablet (5 mg total)  by mouth 2 (two) times daily. Patient taking differently: Take 5 mg by mouth 2 (two) times daily as needed (anxiety).  03/10/16   Josalyn Funches, MD  cholecalciferol (VITAMIN D) 1000 units tablet Take 2,000 Units by mouth daily.    Historical Provider, MD  diclofenac (VOLTAREN) 50 MG EC tablet Take 1 tablet (50 mg total) by mouth 2 (two) times daily. 07/17/16   Felis Quillin Orlene Och, NP  diphenoxylate-atropine (LOMOTIL) 2.5-0.025 MG tablet Take 1 tablet by mouth 4 (four) times daily as needed for diarrhea or loose stools. 05/23/16   Josalyn Funches, MD  gabapentin (NEURONTIN) 300 MG capsule Take 300 mg by mouth at bedtime. 03/10/16   Historical Provider, MD  ibuprofen (ADVIL,MOTRIN) 200 MG tablet Take 800 mg by mouth every 8 (eight) hours as  needed for moderate pain.    Historical Provider, MD  meclizine (ANTIVERT) 25 MG tablet Take 1 tablet (25 mg total) by mouth 3 (three) times daily as needed for dizziness. Patient taking differently: Take 25 mg by mouth 3 (three) times daily as needed for dizziness (for vertigo).  05/23/16   Josalyn Funches, MD  MELATONIN PO Take 2 tablets by mouth at bedtime as needed (sleep). gummys     Historical Provider, MD  metroNIDAZOLE (FLAGYL) 500 MG tablet Take 1 tablet (500 mg total) by mouth 2 (two) times daily. 06/01/16   Tiffany Neva Seat, PA-C  traMADol (ULTRAM) 50 MG tablet Take 1 tablet (50 mg total) by mouth every 6 (six) hours as needed. 07/17/16   Martine Trageser Orlene Och, NP  vitamin B-12 (CYANOCOBALAMIN) 1000 MCG tablet Take 2,000 mcg by mouth daily. Gummies    Historical Provider, MD    Family History Family History  Problem Relation Age of Onset  . Cancer Mother   . Cancer Father   . Diabetes Maternal Grandfather     Social History Social History  Substance Use Topics  . Smoking status: Current Every Day Smoker    Packs/day: 0.50    Types: Cigarettes  . Smokeless tobacco: Never Used  . Alcohol use No     Allergies   Codeine; Vicodin [hydrocodone-acetaminophen]; Iodine; and Latex   Review of Systems Review of Systems  Musculoskeletal: Positive for arthralgias (left foot), back pain (lower) and gait problem (due to pain). Negative for joint swelling.  Skin: Negative for color change, rash and wound.    Physical Exam Updated Vital Signs BP 113/73 (BP Location: Left Arm)   Pulse 95   Temp 98.5 F (36.9 C) (Oral)   Resp 13   Ht 5\' 5"  (1.651 m)   Wt 112.3 kg   SpO2 95%   BMI 41.20 kg/m   Physical Exam  Constitutional: She is oriented to person, place, and time. She appears well-developed and well-nourished. No distress.  HENT:  Head: Normocephalic and atraumatic.  Eyes: EOM are normal.  Neck: Neck supple.  Cardiovascular: Normal rate.   Pulses:      Dorsalis pedis pulses are  2+ on the left side.  Pedal pulses 2+. Adequate circulation.   Pulmonary/Chest: Effort normal. No respiratory distress.  Abdominal: She exhibits no distension.  Musculoskeletal: Normal range of motion.       Left ankle: Achilles tendon exhibits pain. Achilles tendon exhibits no defect and normal Thompson's test results.       Left lower leg: Normal. She exhibits no tenderness and no bony tenderness.       Left foot: There is tenderness. There is normal range of motion.  Left  foot with good strength. FROM. Pt able to dorsi and plantar flexion without difficulty. Tenderness over achilles tendon. No defect palpated. Negative Thompson's test. Pain with palpation on plantar aspect of left heel. No left calf pain.   Neurological: She is alert and oriented to person, place, and time.  Skin: Skin is warm and dry.  Psychiatric: She has a normal mood and affect. Her behavior is normal.  Nursing note and vitals reviewed.    ED Treatments / Results  DIAGNOSTIC STUDIES: Oxygen Saturation is 98% on RA, nl by my interpretation.    COORDINATION OF CARE: 7:28 PM Discussed treatment plan with pt at bedside which includes left foot xray, percocet, and pt agreed to plan.   Radiology No results found.  Procedures Procedures (including critical care time)  Medications Ordered in ED Medications  oxyCODONE-acetaminophen (PERCOCET/ROXICET) 5-325 MG per tablet 1 tablet (1 tablet Oral Given 07/17/16 1935)     Initial Impression / Assessment and Plan / ED Course  I have reviewed the triage vital signs and the nursing notes.  Pertinent imaging results that were available during my care of the patient were reviewed by me and considered in my medical decision making (see chart for details).  Clinical Course    Patient X-Ray negative for obvious fracture or dislocation x-ray does show a heel spur. Pt advised to insert a heel cup to keep the pressure off the area of the bone spur and follow up with  orthopedics. Pt will discharged with tramadol Rx and voltaren Rx. Conservative therapy recommended and discussed. Patient will be discharged home & is agreeable with above plan. Returns precautions discussed. Pt appears safe for discharge.   Final Clinical Impressions(s) / ED Diagnoses   Final diagnoses:  Heel spur, left    New Prescriptions Discharge Medication List as of 07/17/2016  8:45 PM    START taking these medications   Details  diclofenac (VOLTAREN) 50 MG EC tablet Take 1 tablet (50 mg total) by mouth 2 (two) times daily., Starting Mon 07/17/2016, Print    traMADol (ULTRAM) 50 MG tablet Take 1 tablet (50 mg total) by mouth every 6 (six) hours as needed., Starting Mon 07/17/2016, Print       I personally performed the services described in this documentation, which was scribed in my presence. The recorded information has been reviewed and is accurate.     [facility] Orlene Och, NP 07/20/16 1720    Laurence Spates, MD 07/26/16 [phone]

## 2016-07-17 NOTE — ED Triage Notes (Signed)
Pt complaining of lower back pain that radiates to L leg. Pt denies any trauma/injury. Pt also complaining of L heel pain.

## 2016-07-17 NOTE — Discharge Instructions (Signed)
Your x-ray tonight shows that you have a heel spur. Your will need to get a heel cup to take the pressure off the heel. You will need to f/u with an orthopedic doctor. You may need to have a cortisone injection.  Do not drive while taking the narcotic as it will make you sleepy.

## 2016-07-27 MED FILL — traMADol HCL 50 MG TABS: 50 | 4 days supply | Qty: 15 | Fill #0

## 2016-07-27 MED FILL — DICLOFENAC SOD EC 50 MG TAB: 50 | 8 days supply | Qty: 15 | Fill #0

## 2016-08-28 ENCOUNTER — Ambulatory Visit: Payer: Medicaid - Out of State

## 2016-08-29 ENCOUNTER — Ambulatory Visit: Payer: Medicaid - Out of State

## 2016-08-31 ENCOUNTER — Emergency Department (HOSPITAL_COMMUNITY)
Admission: EM | Admit: 2016-08-31 | Discharge: 2016-08-31 | Disposition: A | Discharge status: Home / Self Care | Payer: Medicaid - Out of State | Attending: Emergency Medicine | Admitting: Emergency Medicine

## 2016-08-31 ENCOUNTER — Encounter (HOSPITAL_COMMUNITY): Payer: Self-pay | Admitting: *Deleted

## 2016-08-31 DIAGNOSIS — Z91040 Latex allergy status: Secondary | ICD-10-CM | POA: Insufficient documentation

## 2016-08-31 DIAGNOSIS — G8929 Other chronic pain: Secondary | ICD-10-CM | POA: Insufficient documentation

## 2016-08-31 DIAGNOSIS — F17210 Nicotine dependence, cigarettes, uncomplicated: Secondary | ICD-10-CM | POA: Insufficient documentation

## 2016-08-31 DIAGNOSIS — M79672 Pain in left foot: Secondary | ICD-10-CM | POA: Insufficient documentation

## 2016-08-31 DIAGNOSIS — M5432 Sciatica, left side: Secondary | ICD-10-CM | POA: Insufficient documentation

## 2016-08-31 DIAGNOSIS — J45909 Unspecified asthma, uncomplicated: Secondary | ICD-10-CM | POA: Insufficient documentation

## 2016-08-31 MED ORDER — KETOROLAC TROMETHAMINE 60 MG/2ML IM SOLN
60.0000 mg | Freq: Once | INTRAMUSCULAR | Status: AC
  Administered 2016-08-31: 60 mg via INTRAMUSCULAR
  Filled 2016-08-31: qty 2

## 2016-08-31 MED ORDER — OXYCODONE-ACETAMINOPHEN 5-325 MG PO TABS
2.0000 | ORAL_TABLET | Freq: Once | ORAL | Status: AC
  Administered 2016-08-31: 2 via ORAL
  Filled 2016-08-31: qty 2

## 2016-08-31 MED ORDER — OXYCODONE HCL 5 MG PO TABS: 5.0000 mg | ORAL_TABLET | Freq: Two times a day (BID) | ORAL | 0 refills | Status: DC | PRN

## 2016-08-31 NOTE — ED Triage Notes (Signed)
The pt reports that she has heel spurs but cannot afford to see  A foot doctor

## 2016-08-31 NOTE — ED Provider Notes (Signed)
[facility]-EMERGENCY DEPT Provider Note   CSN: [id number] Arrival date & time: 08/31/16  0503     History   Chief Complaint Chief Complaint  Patient presents with  . Foot Pain    HPI Kathy Conway is a 40 y.o. female PMH of fibromyalgia and DJD, here with worsening back and L heel pain.  Patient states this has been chronic for months and slowly getting worse.  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  She works 2 jobs and can not afford to take a leave.  At the end of the day her pain becomes worse.  She tries ice packs and soaking in warm bath.  She is applying for disability in March 2018.  She can not afford to see podiatry but has PCP follow up Nov 30.  She has no neuro symptoms or incontinence.  There are no further complaints.  10 Systems reviewed and are negative for acute change except as noted in the HPI.   HPI  Past Medical History:  Diagnosis Date  . Asthma 10/1993  . Fibromyalgia   . H/O degenerative disc disease   . IBS (irritable bowel syndrome) 10/2003  . Migraine 10/1996  . Renal disorder   . Vertigo 10/2009    Patient Active Problem List   Diagnosis Date Noted  . IBS (irritable bowel syndrome) 05/23/2016  . Asthma   . Paresthesia of both hands 03/10/2016  . Boils 03/10/2016  . Anxiety state 03/10/2016  . H/O degenerative disc disease   . Fibromyalgia   . Vertigo 10/09/2009  . Migraine 10/09/1996    Past Surgical History:  Procedure Laterality Date  . ABDOMINAL HYSTERECTOMY    . CHOLECYSTECTOMY    . TUBAL LIGATION      OB History    Gravida Para Term Preterm AB Living   2 2       2    SAB TAB Ectopic Multiple Live Births                   Home Medications    Prior to Admission medications   Medication Sig Start Date End Date Taking? Authorizing Provider  Acetaminophen-Caffeine (EXCEDRIN TENSION HEADACHE) 500-65 MG TABS Take 650 mg by mouth as needed.    Historical Provider, MD  busPIRone (BUSPAR) 5 MG tablet Take  1 tablet (5 mg total) by mouth 2 (two) times daily. Patient taking differently: Take 5 mg by mouth 2 (two) times daily as needed (anxiety).  03/10/16   Josalyn Funches, MD  cholecalciferol (VITAMIN D) 1000 units tablet Take 2,000 Units by mouth daily.    Historical Provider, MD  diclofenac (VOLTAREN) 50 MG EC tablet Take 1 tablet (50 mg total) by mouth 2 (two) times daily. 07/17/16   Hope Orlene Och, NP  diphenoxylate-atropine (LOMOTIL) 2.5-0.025 MG tablet Take 1 tablet by mouth 4 (four) times daily as needed for diarrhea or loose stools. 05/23/16   Josalyn Funches, MD  gabapentin (NEURONTIN) 300 MG capsule Take 300 mg by mouth at bedtime. 03/10/16   Historical Provider, MD  ibuprofen (ADVIL,MOTRIN) 200 MG tablet Take 800 mg by mouth every 8 (eight) hours as needed for moderate pain.    Historical Provider, MD  meclizine (ANTIVERT) 25 MG tablet Take 1 tablet (25 mg total) by mouth 3 (three) times daily as needed for dizziness. Patient taking differently: Take 25 mg by mouth 3 (three) times daily as needed for dizziness (for vertigo).  05/23/16  Dessa PhiJosalyn Funches, MD  MELATONIN PO Take 2 tablets by mouth at bedtime as needed (sleep). gummys     Historical Provider, MD  metroNIDAZOLE (FLAGYL) 500 MG tablet Take 1 tablet (500 mg total) by mouth 2 (two) times daily. 06/01/16   Tiffany Neva SeatGreene, PA-C  oxyCODONE (ROXICODONE) 5 MG immediate release tablet Take 1 tablet (5 mg total) by mouth 2 (two) times daily as needed for severe pain. 08/31/16   Tomasita CrumbleAdeleke Kelly Ranieri, MD  traMADol (ULTRAM) 50 MG tablet Take 1 tablet (50 mg total) by mouth every 6 (six) hours as needed. 07/17/16   Hope Orlene OchM Neese, NP  vitamin B-12 (CYANOCOBALAMIN) 1000 MCG tablet Take 2,000 mcg by mouth daily. Gummies    Historical Provider, MD    Family History Family History  Problem Relation Age of Onset  . Cancer Mother   . Cancer Father   . Diabetes Maternal Grandfather     Social History Social History  Substance Use Topics  . Smoking status: Current  Every Day Smoker    Packs/day: 0.50    Types: Cigarettes  . Smokeless tobacco: Never Used  . Alcohol use No     Allergies   Codeine; Vicodin [hydrocodone-acetaminophen]; Iodine; and Latex   Review of Systems Review of Systems   Physical Exam Updated Vital Signs BP 136/83 (BP Location: Left Arm)   Pulse 98   Temp 98.4 F (36.9 C) (Oral)   Resp 22   Ht 5\' 6"  (1.676 m)   Wt 247 lb (112 kg)   SpO2 98%   BMI 39.87 kg/m   Physical Exam  Constitutional: She is oriented to person, place, and time. She appears well-developed and well-nourished. No distress.  HENT:  Head: Normocephalic and atraumatic.  Nose: Nose normal.  Mouth/Throat: Oropharynx is clear and moist. No oropharyngeal exudate.  Eyes: Conjunctivae and EOM are normal. Pupils are equal, round, and reactive to light. No scleral icterus.  Neck: Normal range of motion. Neck supple. No JVD present. No tracheal deviation present. No thyromegaly present.  Cardiovascular: Normal rate, regular rhythm and normal heart sounds.  Exam reveals no gallop and no friction rub.   No murmur heard. Pulmonary/Chest: Effort normal and breath sounds normal. No respiratory distress. She has no wheezes. She exhibits no tenderness.  Abdominal: Soft. Bowel sounds are normal. She exhibits no distension and no mass. There is no tenderness. There is no rebound and no guarding.  Musculoskeletal: Normal range of motion. She exhibits no edema or tenderness.  Lymphadenopathy:    She has no cervical adenopathy.  Neurological: She is alert and oriented to person, place, and time. No cranial nerve deficit. She exhibits normal muscle tone.  Skin: Skin is warm and dry. No rash noted. No erythema. No pallor.  Nursing note and vitals reviewed.    ED Treatments / Results  Labs (all labs ordered are listed, but only abnormal results are displayed) Labs Reviewed - No data to display  EKG  EKG Interpretation None       Radiology No results  found.  Procedures Procedures (including critical care time)  Medications Ordered in ED Medications  ketorolac (TORADOL) injection 60 mg (not administered)  oxyCODONE-acetaminophen (PERCOCET/ROXICET) 5-325 MG per tablet 2 tablet (not administered)     Initial Impression / Assessment and Plan / ED Course  I have reviewed the triage vital signs and the nursing notes.  Pertinent labs & imaging results that were available during my care of the patient were reviewed by me and considered in  my medical decision making (see chart for details).  Clinical Course      Patient presents to the ED for chronic back and heel pain.  Neuro exam is normal and gait is normal.  Foot exam is unremarkable.  She was given toradol and oxycodone and will be sent home with 10tabs for acute pain.  PCP fu advised. She appears well and in NAD. VS remain within her normal limits and she is safe for DC.  Final Clinical Impressions(s) / ED Diagnoses   Final diagnoses:  Heel pain, chronic, left  Sciatica of left side    New Prescriptions New Prescriptions   OXYCODONE (ROXICODONE) 5 MG IMMEDIATE RELEASE TABLET    Take 1 tablet (5 mg total) by mouth 2 (two) times daily as needed for severe pain.     Tomasita CrumbleAdeleke Johnae Friley, MD 08/31/16 [phone]

## 2016-08-31 NOTE — ED Triage Notes (Signed)
The pt is c/o lt and rt foot pain for months  Getting worse for the past week  More swelling of lt than right and she is having pain in her back now.  lmp none  She was seen here recently for the same

## 2016-09-07 ENCOUNTER — Encounter: Payer: Self-pay | Admitting: Family Medicine

## 2016-09-07 ENCOUNTER — Ambulatory Visit: Payer: Self-pay | Attending: Family Medicine | Admitting: Family Medicine

## 2016-09-07 VITALS — BP 129/90 | HR 99 | Temp 98.9°F | Resp 16 | Wt 246.2 lb

## 2016-09-07 DIAGNOSIS — Z23 Encounter for immunization: Secondary | ICD-10-CM

## 2016-09-07 DIAGNOSIS — M5441 Lumbago with sciatica, right side: Secondary | ICD-10-CM | POA: Insufficient documentation

## 2016-09-07 DIAGNOSIS — K580 Irritable bowel syndrome with diarrhea: Secondary | ICD-10-CM | POA: Insufficient documentation

## 2016-09-07 DIAGNOSIS — M5442 Lumbago with sciatica, left side: Secondary | ICD-10-CM | POA: Insufficient documentation

## 2016-09-07 DIAGNOSIS — M7989 Other specified soft tissue disorders: Secondary | ICD-10-CM | POA: Insufficient documentation

## 2016-09-07 DIAGNOSIS — R197 Diarrhea, unspecified: Secondary | ICD-10-CM | POA: Insufficient documentation

## 2016-09-07 DIAGNOSIS — M79673 Pain in unspecified foot: Secondary | ICD-10-CM | POA: Insufficient documentation

## 2016-09-07 DIAGNOSIS — G8929 Other chronic pain: Secondary | ICD-10-CM | POA: Insufficient documentation

## 2016-09-07 DIAGNOSIS — Z79899 Other long term (current) drug therapy: Secondary | ICD-10-CM | POA: Insufficient documentation

## 2016-09-07 DIAGNOSIS — F17210 Nicotine dependence, cigarettes, uncomplicated: Secondary | ICD-10-CM | POA: Insufficient documentation

## 2016-09-07 MED ORDER — CYCLOBENZAPRINE HCL 10 MG PO TABS: 10.0000 mg | ORAL_TABLET | Freq: Every day | ORAL | 2 refills | Status: DC

## 2016-09-07 MED ORDER — GABAPENTIN 300 MG PO CAPS: 300.0000 mg | ORAL_CAPSULE | Freq: Every day | ORAL | 3 refills | Status: DC

## 2016-09-07 MED ORDER — PREDNISONE 20 MG PO TABS: ORAL_TABLET | ORAL | 0 refills | Status: DC

## 2016-09-07 MED ORDER — DIPHENOXYLATE-ATROPINE 2.5-0.025 MG PO TABS: 1.0000 | ORAL_TABLET | Freq: Four times a day (QID) | ORAL | 2 refills | Status: DC | PRN

## 2016-09-07 MED FILL — GABAPENTIN 300 MG CAPSULE: 300 | 30 days supply | Qty: 30 | Fill #0

## 2016-09-07 MED FILL — predniSONE 20 MG TABS: 20 | 15 days supply | Qty: 24 | Fill #0

## 2016-09-07 MED FILL — CYCLOBENZAPRINE 10 MG TAB: 10 | 30 days supply | Qty: 30 | Fill #0

## 2016-09-07 NOTE — Progress Notes (Signed)
Pt is in the office today for left foot pain Pt states her pain level is a 9 Pt states her foot has been hurting for a month Pt states she has only been taking ibuprofen for the pain Pt states she had to quit one of jobs because of her foot pain and had to get a cane

## 2016-09-07 NOTE — Progress Notes (Signed)
Subjective:  Patient ID: Kathy Conway, female    DOB: 1975-10-28  Age: 40 y.o. MRN: [id number]  CC: Foot Pain   HPI Kathy Goodpasturendria D Goatley presents for   1. Foot pain: x one month. Started on left side. Had swelling in ankle and leg. Pain in posterior heel. No recent injury to left foot. She has history of chronic low back pain for the past 10 years. Pain is getting worse in back. Pain goes across low back. Current pain level is 9/10. She is walking with a cane. She is starting to have pain down her R leg for the past week. She has no fecal or urinary incontinence.   She quit her job at [employer]. She works at [employer]. She was written out of work for one week. She denies improvement in her overall pain. She feels depressed and anxious.   Social History  Substance Use Topics  . Smoking status: Current Every Day Smoker    Packs/day: 0.50    Types: Cigarettes  . Smokeless tobacco: Never Used  . Alcohol use No    Outpatient Medications Prior to Visit  Medication Sig Dispense Refill  . ibuprofen (ADVIL,MOTRIN) 200 MG tablet Take 800 mg by mouth every 8 (eight) hours as needed for moderate pain.    . Acetaminophen-Caffeine (EXCEDRIN TENSION HEADACHE) 500-65 MG TABS Take 650 mg by mouth as needed.    . busPIRone (BUSPAR) 5 MG tablet Take 1 tablet (5 mg total) by mouth 2 (two) times daily. (Patient not taking: Reported on 09/07/2016) 60 tablet 0  . cholecalciferol (VITAMIN D) 1000 units tablet Take 2,000 Units by mouth daily.    . diclofenac (VOLTAREN) 50 MG EC tablet Take 1 tablet (50 mg total) by mouth 2 (two) times daily. (Patient not taking: Reported on 09/07/2016) 15 tablet 0  . diphenoxylate-atropine (LOMOTIL) 2.5-0.025 MG tablet Take 1 tablet by mouth 4 (four) times daily as needed for diarrhea or loose stools. (Patient not taking: Reported on 09/07/2016) 60 tablet 0  . gabapentin (NEURONTIN) 300 MG capsule Take 300 mg by mouth at bedtime.  3  . meclizine (ANTIVERT) 25 MG  tablet Take 1 tablet (25 mg total) by mouth 3 (three) times daily as needed for dizziness. (Patient not taking: Reported on 09/07/2016) 30 tablet 2  . MELATONIN PO Take 2 tablets by mouth at bedtime as needed (sleep). gummys     . metroNIDAZOLE (FLAGYL) 500 MG tablet Take 1 tablet (500 mg total) by mouth 2 (two) times daily. (Patient not taking: Reported on 09/07/2016) 14 tablet 0  . oxyCODONE (ROXICODONE) 5 MG immediate release tablet Take 1 tablet (5 mg total) by mouth 2 (two) times daily as needed for severe pain. (Patient not taking: Reported on 09/07/2016) 10 tablet 0  . traMADol (ULTRAM) 50 MG tablet Take 1 tablet (50 mg total) by mouth every 6 (six) hours as needed. (Patient not taking: Reported on 09/07/2016) 15 tablet 0  . vitamin B-12 (CYANOCOBALAMIN) 1000 MCG tablet Take 2,000 mcg by mouth daily. Gummies     No facility-administered medications prior to visit.     ROS Review of Systems  Constitutional: Negative for chills and fever.  HENT: Positive for ear pain.   Eyes: Positive for visual disturbance.  Respiratory: Negative for shortness of breath.   Cardiovascular: Negative for chest pain.  Gastrointestinal: Positive for diarrhea and nausea. Negative for abdominal distention, abdominal pain, anal bleeding, blood in stool, constipation, rectal pain and vomiting.  Musculoskeletal:  Positive for arthralgias, back pain and myalgias. Negative for joint swelling.  Skin: Positive for color change. Negative for rash.  Allergic/Immunologic: Negative for immunocompromised state.  Neurological: Positive for dizziness and headaches. Negative for tremors, seizures, syncope, facial asymmetry, speech difficulty, weakness, light-headedness and numbness.  Hematological: Negative for adenopathy. Does not bruise/bleed easily.  Psychiatric/Behavioral: Positive for dysphoric mood. Negative for suicidal ideas. The patient is nervous/anxious.     Objective:  BP 129/90 (BP Location: Left Arm, Patient  Position: Sitting, Cuff Size: Large)   Pulse 99   Temp 98.9 F (37.2 C) (Oral)   Resp 16   Wt 246 lb 3.2 oz (111.7 kg)   SpO2 97%   BMI 39.74 kg/m   BP/Weight 09/07/2016 08/31/2016 07/17/2016  Systolic BP 129 123 113  Diastolic BP 90 83 73  Wt. (Lbs) 246.2 247 247.56  BMI 39.74 39.87 41.2    Physical Exam  Constitutional: She is oriented to person, place, and time. She appears well-developed and well-nourished. No distress.  HENT:  Head: Normocephalic and atraumatic.  Cardiovascular: Normal rate, regular rhythm, normal heart sounds and intact distal pulses.   Pulmonary/Chest: Effort normal and breath sounds normal.  Musculoskeletal: She exhibits no edema.  Back Exam: Back: Normal Curvature, no deformities or CVA tenderness  Paraspinal Tenderness: b/l lumbar   LE Strength 5/5  LE Sensation: in tact  LE Reflexes 2+ and symmetric  Straight leg raise: positive    Neurological: She is alert and oriented to person, place, and time.  Skin: Skin is warm and dry. No rash noted.  Psychiatric: She has a normal mood and affect.     Assessment & Plan:  Markus Jarvisndria was seen today for foot pain.  Diagnoses and all orders for this visit:  Need for Tdap vaccination -     Tdap vaccine greater than or equal to 7yo IM  Irritable bowel syndrome with diarrhea -     diphenoxylate-atropine (LOMOTIL) 2.5-0.025 MG tablet; Take 1 tablet by mouth 4 (four) times daily as needed for diarrhea or loose stools.  Chronic bilateral low back pain with bilateral sciatica -     gabapentin (NEURONTIN) 300 MG capsule; Take 1 capsule (300 mg total) by mouth at bedtime. -     cyclobenzaprine (FLEXERIL) 10 MG tablet; Take 1 tablet (10 mg total) by mouth at bedtime. -     predniSONE (DELTASONE) 20 MG tablet; Take every morning with food 60 mg daily for 3 days, 40 mg daily for 3 days, 30 mg daily for 3 days, 20 mg daily for 3 days, 10 mg daily for 3 days then STOP  Other orders -     Cancel: Tdap vaccine greater  than or equal to 7yo IM  There are no diagnoses linked to this encounter.  No orders of the defined types were placed in this encounter.   Follow-up: Return in about 4 weeks (around 10/05/2016) for back and foot pain .   Dessa PhiJosalyn Ly Bacchi MD

## 2016-09-07 NOTE — Patient Instructions (Addendum)
Kathy Conway was seen today for foot pain.  Diagnoses and all orders for this visit:  Need for Tdap vaccination  Irritable bowel syndrome with diarrhea -     diphenoxylate-atropine (LOMOTIL) 2.5-0.025 MG tablet; Take 1 tablet by mouth 4 (four) times daily as needed for diarrhea or loose stools.  Chronic bilateral low back pain with bilateral sciatica -     gabapentin (NEURONTIN) 300 MG capsule; Take 1 capsule (300 mg total) by mouth at bedtime. -     cyclobenzaprine (FLEXERIL) 10 MG tablet; Take 1 tablet (10 mg total) by mouth at bedtime. -     predniSONE (DELTASONE) 20 MG tablet; Take every morning with food 60 mg daily for 3 days, 40 mg daily for 3 days, 30 mg daily for 3 days, 20 mg daily for 3 days, 10 mg daily for 3 days then STOP  Other orders -     Tdap vaccine greater than or equal to 7yo IM -     Cancel: Tdap vaccine greater than or equal to 7yo IM    F/u in 4 weeks for back and foot pain   Dr. Armen Pickup    Sciatica Rehab Ask your health care provider which exercises are safe for you. Do exercises exactly as told by your health care provider and adjust them as directed. It is normal to feel mild stretching, pulling, tightness, or discomfort as you do these exercises, but you should stop right away if you feel sudden pain or your pain gets worse.Do not begin these exercises until told by your health care provider. Stretching and range of motion exercises These exercises warm up your muscles and joints and improve the movement and flexibility of your hips and your back. These exercises also help to relieve pain, numbness, and tingling. Exercise A: Sciatic nerve glide 1. Sit in a chair with your head facing down toward your chest. Place your hands behind your back. Let your shoulders slump forward. 2. Slowly straighten one of your knees while you tilt your head back as if you are looking toward the ceiling. Only straighten your leg as far as you can without making your symptoms  worse. 3. Hold for __________ seconds. 4. Slowly return to the starting position. 5. Repeat with your other leg. Repeat __________ times. Complete this exercise __________ times a day. Exercise B: Knee to chest with hip adduction and internal rotation 1. Lie on your back on a firm surface with both legs straight. 2. Bend one of your knees and move it up toward your chest until you feel a gentle stretch in your lower back and buttock. Then, move your knee toward the shoulder that is on the opposite side from your leg.  Hold your leg in this position by holding onto the front of your knee. 3. Hold for __________ seconds. 4. Slowly return to the starting position. 5. Repeat with your other leg. Repeat __________ times. Complete this exercise __________ times a day. Exercise C: Prone extension on elbows 1. Lie on your abdomen on a firm surface. A bed may be too soft for this exercise. 2. Prop yourself up on your elbows. 3. Use your arms to help lift your chest up until you feel a gentle stretch in your abdomen and your lower back.  This will place some of your body weight on your elbows. If this is uncomfortable, try stacking pillows under your chest.  Your hips should stay down, against the surface that you are lying on. Keep your hip  and back muscles relaxed. 4. Hold for __________ seconds. 5. Slowly relax your upper body and return to the starting position. Repeat __________ times. Complete this exercise __________ times a day. Strengthening exercises These exercises build strength and endurance in your back. Endurance is the ability to use your muscles for a long time, even after they get tired. Exercise D: Pelvic tilt 1. Lie on your back on a firm surface. Bend your knees and keep your feet flat. 2. Tense your abdominal muscles. Tip your pelvis up toward the ceiling and flatten your lower back into the floor.  To help with this exercise, you may place a small towel under your lower  back and try to push your back into the towel. 3. Hold for __________ seconds. 4. Let your muscles relax completely before you repeat this exercise. Repeat __________ times. Complete this exercise __________ times a day. Exercise E: Alternating arm and leg raises 1. Get on your hands and knees on a firm surface. If you are on a hard floor, you may want to use padding to cushion your knees, such as an exercise mat. 2. Line up your arms and legs. Your hands should be below your shoulders, and your knees should be below your hips. 3. Lift your left leg behind you. At the same time, raise your right arm and straighten it in front of you.  Do not lift your leg higher than your hip.  Do not lift your arm higher than your shoulder.  Keep your abdominal and back muscles tight.  Keep your hips facing the ground.  Do not arch your back.  Keep your balance carefully, and do not hold your breath. 4. Hold for __________ seconds. 5. Slowly return to the starting position and repeat with your right leg and your left arm. Repeat __________ times. Complete this exercise __________ times a day. Posture and body mechanics   Body mechanics refers to the movements and positions of your body while you do your daily activities. Posture is part of body mechanics. Good posture and healthy body mechanics can help to relieve stress in your body's tissues and joints. Good posture means that your spine is in its natural S-curve position (your spine is neutral), your shoulders are pulled back slightly, and your head is not tipped forward. The following are general guidelines for applying improved posture and body mechanics to your everyday activities. Standing   When standing, keep your spine neutral and your feet about hip-width apart. Keep a slight bend in your knees. Your ears, shoulders, and hips should line up.  When you do a task in which you stand in one place for a long time, place one foot up on a stable  object that is 2-4 inches (5-10 cm) high, such as a footstool. This helps keep your spine neutral. Sitting  When sitting, keep your spine neutral and keep your feet flat on the floor. Use a footrest, if necessary, and keep your thighs parallel to the floor. Avoid rounding your shoulders, and avoid tilting your head forward.  When working at a desk or a computer, keep your desk at a height where your hands are slightly lower than your elbows. Slide your chair under your desk so you are close enough to maintain good posture.  When working at a computer, place your monitor at a height where you are looking straight ahead and you do not have to tilt your head forward or downward to look at the screen. Resting  When lying down and resting, avoid positions that are most painful for you.  If you have pain with activities such as sitting, bending, stooping, or squatting (flexion-based activities), lie in a position in which your body does not bend very much. For example, avoid curling up on your side with your arms and knees near your chest (fetal position).  If you have pain with activities such as standing for a long time or reaching with your arms (extension-based activities), lie with your spine in a neutral position and bend your knees slightly. Try the following positions:  Lying on your side with a pillow between your knees.  Lying on your back with a pillow under your knees. Lifting   When lifting objects, keep your feet at least shoulder-width apart and tighten your abdominal muscles.  Bend your knees and hips and keep your spine neutral. It is important to lift using the strength of your legs, not your back. Do not lock your knees straight out.  Always ask for help to lift heavy or awkward objects. This information is not intended to replace advice given to you by your health care provider. Make sure you discuss any questions you have with your health care provider. Document Released:  09/25/2005 Document Revised: 06/01/2016 Document Reviewed: 06/11/2015 Elsevier Interactive Patient Education  2017 [employer]   Td Vaccine (Tetanus and Diphtheria): What You Need to Know 1. Why get vaccinated? Tetanus  and diphtheria are very serious diseases. They are rare in the [place] today, but people who do become infected often have severe complications. Td vaccine is used to protect adolescents and adults from both of these diseases. Both tetanus and diphtheria are infections caused by bacteria. Diphtheria spreads from person to person through coughing or sneezing. Tetanus-causing bacteria enter the body through cuts, scratches, or wounds. TETANUS (lockjaw) causes painful muscle tightening and stiffness, usually all over the body.  It can lead to tightening of muscles in the head and neck so you can't open your mouth, swallow, or sometimes even breathe. Tetanus kills about 1 out of every 10 people who are infected even after receiving the best medical care. DIPHTHERIA can cause a thick coating to form in the back of the throat.  It can lead to breathing problems, paralysis, heart failure, and death. Before vaccines, as many as 200,000 cases of diphtheria and hundreds of cases of tetanus were reported in the [place] each year. Since vaccination began, reports of cases for both diseases have dropped by about 99%. 2. Td vaccine Td vaccine can protect adolescents and adults from tetanus and diphtheria. Td is usually given as a booster dose every 10 years but it can also be given earlier after a severe and dirty wound or burn. Another vaccine, called Tdap, which protects against pertussis in addition to tetanus and diphtheria, is sometimes recommended instead of Td vaccine. Your doctor or the person giving you the vaccine can give you more information. Td may safely be given at the same time as other vaccines. 3. Some people should not get this vaccine  A person who has ever  had a life-threatening allergic reaction after a previous dose of any tetanus or diphtheria containing vaccine, OR has a severe allergy to any part of this vaccine, should not get Td vaccine. Tell the person giving the vaccine about any severe allergies.  Talk to your doctor if you:  had severe pain or swelling after any vaccine containing diphtheria or tetanus,  ever had a  condition called Guillain Barre Syndrome (GBS),  aren't feeling well on the day the shot is scheduled. 4. What are the risks from Td vaccine? With any medicine, including vaccines, there is a chance of side effects. These are usually mild and go away on their own. Serious reactions are also possible but are rare. Most people who get Td vaccine do not have any problems with it. Mild problems following Td vaccine: (Did not interfere with activities)  Pain where the shot was given (about 8 people in 10)  Redness or swelling where the shot was given (about 1 person in 4)  Mild fever (rare)  Headache (about 1 person in 4)  Tiredness (about 1 person in 4) Moderate problems following Td vaccine: (Interfered with activities, but did not require medical attention)  Fever over 102F (rare) Severe problems following Td vaccine: (Unable to perform usual activities; required medical attention)  Swelling, severe pain, bleeding and/or redness in the arm where the shot was given (rare). Problems that could happen after any vaccine:  People sometimes faint after a medical procedure, including vaccination. Sitting or lying down for about 15 minutes can help prevent fainting, and injuries caused by a fall. Tell your doctor if you feel dizzy, or have vision changes or ringing in the ears.  Some people get severe pain in the shoulder and have difficulty moving the arm where a shot was given. This happens very rarely.  Any medication can cause a severe allergic reaction. Such reactions from a vaccine are very rare, estimated at  fewer than 1 in a million doses, and would happen within a few minutes to a few hours after the vaccination. As with any medicine, there is a very remote chance of a vaccine causing a serious injury or death. The safety of vaccines is always being monitored. For more information, visit: http://floyd.org/ 5. What if there is a serious reaction? What should I look for? Look for anything that concerns you, such as signs of a severe allergic reaction, very high fever, or unusual behavior. Signs of a severe allergic reaction can include hives, swelling of the face and throat, difficulty breathing, a fast heartbeat, dizziness, and weakness. These would usually start a few minutes to a few hours after the vaccination. What should I do?  If you think it is a severe allergic reaction or other emergency that can't wait, call 9-1-1 or get the person to the nearest hospital. Otherwise, call your doctor.  Afterward, the reaction should be reported to the Vaccine Adverse Event Reporting System (VAERS). Your doctor might file this report, or you can do it yourself through the VAERS web site at www.vaers.LAgents.no, or by calling 1-802-787-0773.  VAERS does not give medical advice. 6. The National Vaccine Injury Compensation Program The [employer] Vaccine Injury Compensation Program (VICP) is a federal program that was created to compensate people who may have been injured by certain vaccines. Persons who believe they may have been injured by a vaccine can learn about the program and about filing a claim by calling 1-(773)748-1919 or visiting the VICP website at SpiritualWord.at. There is a time limit to file a claim for compensation. 7. How can I learn more?  Ask your doctor. He or she can give you the vaccine package insert or suggest other sources of information.  Call your local or state health department.  Contact the Centers for Disease Control and Prevention (CDC):  Call  810 661 8411 (1-800-CDC-INFO)  Visit CDC's website at PicCapture.uy CDC Td Vaccine VIS (  01/18/16) This information is not intended to replace advice given to you by your health care provider. Make sure you discuss any questions you have with your health care provider. Document Released: 07/23/2006 Document Revised: 06/15/2016 Document Reviewed: 06/15/2016 Elsevier Interactive Patient Education  2017 [employer]

## 2016-09-08 DIAGNOSIS — M5441 Lumbago with sciatica, right side: Secondary | ICD-10-CM

## 2016-09-08 DIAGNOSIS — G8929 Other chronic pain: Secondary | ICD-10-CM | POA: Insufficient documentation

## 2016-09-08 DIAGNOSIS — M5442 Lumbago with sciatica, left side: Secondary | ICD-10-CM

## 2016-09-08 NOTE — Assessment & Plan Note (Signed)
Chronic pain with neuropathic symptoms Gabapentin flexeril Course of steroid

## 2016-09-11 ENCOUNTER — Telehealth: Payer: Self-pay | Admitting: Family Medicine

## 2016-09-11 NOTE — Telephone Encounter (Signed)
Pt was called and informed of paperwork being ready to fill out.

## 2016-09-11 NOTE — Telephone Encounter (Signed)
Please call patient  Summary of medical treatment form completed and ready for pick up

## 2016-10-18 ENCOUNTER — Emergency Department (HOSPITAL_COMMUNITY): Payer: Self-pay

## 2016-10-18 ENCOUNTER — Encounter (HOSPITAL_COMMUNITY): Payer: Self-pay | Admitting: Emergency Medicine

## 2016-10-18 ENCOUNTER — Emergency Department (HOSPITAL_COMMUNITY)
Admission: EM | Admit: 2016-10-18 | Discharge: 2016-10-19 | Disposition: A | Discharge status: Home / Self Care | Payer: Self-pay | Attending: Emergency Medicine | Admitting: Emergency Medicine

## 2016-10-18 DIAGNOSIS — R109 Unspecified abdominal pain: Secondary | ICD-10-CM

## 2016-10-18 DIAGNOSIS — Z79899 Other long term (current) drug therapy: Secondary | ICD-10-CM | POA: Insufficient documentation

## 2016-10-18 DIAGNOSIS — Z91040 Latex allergy status: Secondary | ICD-10-CM | POA: Insufficient documentation

## 2016-10-18 DIAGNOSIS — J45909 Unspecified asthma, uncomplicated: Secondary | ICD-10-CM | POA: Insufficient documentation

## 2016-10-18 DIAGNOSIS — M25572 Pain in left ankle and joints of left foot: Secondary | ICD-10-CM | POA: Insufficient documentation

## 2016-10-18 DIAGNOSIS — M79672 Pain in left foot: Secondary | ICD-10-CM

## 2016-10-18 DIAGNOSIS — R1032 Left lower quadrant pain: Secondary | ICD-10-CM | POA: Insufficient documentation

## 2016-10-18 DIAGNOSIS — F17210 Nicotine dependence, cigarettes, uncomplicated: Secondary | ICD-10-CM | POA: Insufficient documentation

## 2016-10-18 NOTE — ED Triage Notes (Signed)
Patient reports left lateral ankle pain with swelling onset this week , denies injury/ambulatory , pt. Stated history of " bone spurs" at affected foot.

## 2016-10-18 NOTE — ED Notes (Signed)
Pt in [place]

## 2016-10-18 NOTE — ED Provider Notes (Signed)
[facility]-EMERGENCY DEPT Provider Note   CSN: [id number] Arrival date & time: 10/18/16  2035  By signing my name below, I, Orpah Cobb, attest that this documentation has been prepared under the direction and in the presence of [facility], PA-C. Electronically Signed: Orpah Cobb , ED Scribe. 10/19/16. 1:29 AM.   History   Chief Complaint Chief Complaint  Patient presents with  . Ankle Pain    HPI Comments: Kathy Conway is a 41 y.o. female with hx of eczema, partial hysterectomy, ovarian cyst, IBS, sciatica, bulging disk and fibromyalgia who presents to the Emergency Department complaining of worsening left lower abdominal pain with onset x2 weeks. Pt states that she has had stabbing L suprapubic abdominal pain similar to her episodes of ovarian cysts in the past. Pt has taken Ibuprofen, Neurontin and Flexeril with no relief. She does endorse nausea, however has this very frequently due to IBS. No blood in the stool or emesis. No vaginal discharge, dysuria, urinary urgency, urinary frequency.  Additionally, patient is also complaining of left heel pain for the last week. She has had similar pain off and on for several years now which she has been told is secondary to a pain spur. Pain is worse when she is working all day, as she has to stand on a cement floor. She has tried absence all soaks in the past with adequate relief, however she did not try this during this flare. No known injury or trauma to the area.     The history is provided by the patient. No language interpreter was used.    Past Medical History:  Diagnosis Date  . Asthma 10/1993  . Fibromyalgia   . H/O degenerative disc disease   . IBS (irritable bowel syndrome) 10/2003  . Migraine 10/1996  . Renal disorder   . Vertigo 10/2009    Patient Active Problem List   Diagnosis Date Noted  . Chronic bilateral low back pain with bilateral sciatica 09/08/2016  . IBS (irritable bowel syndrome) 05/23/2016  . Asthma     . Paresthesia of both hands 03/10/2016  . Boils 03/10/2016  . Anxiety state 03/10/2016  . H/O degenerative disc disease   . Fibromyalgia   . Vertigo 10/09/2009  . Migraine 10/09/1996    Past Surgical History:  Procedure Laterality Date  . ABDOMINAL HYSTERECTOMY    . CHOLECYSTECTOMY    . TUBAL LIGATION      OB History    Gravida Para Term Preterm AB Living   2 2       2    SAB TAB Ectopic Multiple Live Births                   Home Medications    Prior to Admission medications   Medication Sig Start Date End Date Taking? Authorizing Provider  Acetaminophen-Caffeine (EXCEDRIN TENSION HEADACHE) 500-65 MG TABS Take 650 mg by mouth as needed.    Historical Provider, MD  cholecalciferol (VITAMIN D) 1000 units tablet Take 2,000 Units by mouth daily.    Historical Provider, MD  cyclobenzaprine (FLEXERIL) 10 MG tablet Take 1 tablet (10 mg total) by mouth at bedtime. 09/07/16   Josalyn Funches, MD  diphenoxylate-atropine (LOMOTIL) 2.5-0.025 MG tablet Take 1 tablet by mouth 4 (four) times daily as needed for diarrhea or loose stools. 09/07/16   Josalyn Funches, MD  gabapentin (NEURONTIN) 300 MG capsule Take 1 capsule (300 mg total) by mouth at bedtime. 09/07/16   Dessa Phi, MD  ibuprofen (ADVIL,MOTRIN) 200  MG tablet Take 800 mg by mouth every 8 (eight) hours as needed for moderate pain.    Historical Provider, MD  MELATONIN PO Take 2 tablets by mouth at bedtime as needed (sleep). gummys     Historical Provider, MD  predniSONE (DELTASONE) 20 MG tablet Take every morning with food 60 mg daily for 3 days, 40 mg daily for 3 days, 30 mg daily for 3 days, 20 mg daily for 3 days, 10 mg daily for 3 days then STOP 09/07/16   Josalyn Funches, MD  vitamin B-12 (CYANOCOBALAMIN) 1000 MCG tablet Take 2,000 mcg by mouth daily. Gummies    Historical Provider, MD    Family History Family History  Problem Relation Age of Onset  . Cancer Mother   . Cancer Father   . Diabetes Maternal  Grandfather     Social History Social History  Substance Use Topics  . Smoking status: Current Every Day Smoker    Packs/day: 0.50    Types: Cigarettes  . Smokeless tobacco: Never Used  . Alcohol use No     Allergies   Codeine; Vicodin [hydrocodone-acetaminophen]; Iodine; and Latex   Review of Systems Review of Systems  Constitutional: Negative for chills and fever.  HENT: Negative for congestion.   Eyes: Negative for visual disturbance.  Respiratory: Negative for shortness of breath.   Cardiovascular: Negative for chest pain.  Gastrointestinal: Positive for abdominal pain (L suprapubic region), diarrhea and nausea. Negative for blood in stool.  Genitourinary: Negative for dysuria and vaginal discharge.  Musculoskeletal: Positive for arthralgias.  Skin: Negative for wound.  Neurological: Negative for weakness and numbness.     Physical Exam Updated Vital Signs BP 137/87 (BP Location: Right Arm)   Pulse 109   Temp 98.5 F (36.9 C) (Oral)   Resp 18   Ht 5\' 6"  (1.676 m)   Wt 114.3 kg   SpO2 97%   BMI 40.67 kg/m   Physical Exam  Constitutional: She is oriented to person, place, and time. She appears well-developed and well-nourished. No distress.  HENT:  Head: Normocephalic and atraumatic.  Cardiovascular: Normal rate, regular rhythm and normal heart sounds.   No murmur heard. Pulmonary/Chest: Effort normal and breath sounds normal. No respiratory distress.  Abdominal: Soft. Bowel sounds are normal. She exhibits no distension. There is tenderness. There is no rebound, no guarding and no CVA tenderness.    Musculoskeletal:  Left foot/ankle: No gross deformity noted. Patient has full ROM without pain. There is no joint effusion noted. No erythema or warmth overlaying the joint. There is tenderness to palpation over the heel with no overlying skin changes or open wounds. 2+ DP pulses, sensation intact.  Neurological: She is alert and oriented to person, place, and  time.  Skin: Skin is warm and dry.  Nursing note and vitals reviewed.    ED Treatments / Results   DIAGNOSTIC STUDIES: Oxygen Saturation is 99% on RA, normal by my interpretation.   COORDINATION OF CARE: 1:29 AM-Discussed next steps with pt. Pt verbalized understanding and is agreeable with the plan.    Labs (all labs ordered are listed, but only abnormal results are displayed) Labs Reviewed - No data to display  EKG  EKG Interpretation None       Radiology Dg Ankle Complete Left  Result Date: 10/18/2016 CLINICAL DATA:  Left ankle pain for 1 week. No known injury. History of bone spur and ankle fracture. Pain with weight-bearing. EXAM: LEFT ANKLE COMPLETE - 3+ VIEW COMPARISON:  Left  foot 07/17/2016 FINDINGS: Mild soft tissue swelling about the left ankle. Left ankle appears otherwise intact. No evidence of acute fracture or subluxation. No focal bone lesion or bone destruction. Bone cortex and trabecular architecture appear intact. Small Achilles and plantar calcaneal spurs. No radiopaque soft tissue foreign bodies. IMPRESSION: No acute bony abnormalities.  Mild soft tissue swelling. Electronically Signed   By: Burman NievesWilliam  Stevens M.D.   On: 10/18/2016 21:42   [place] Transvaginal Non-ob  Result Date: 10/19/2016 CLINICAL DATA:  Left lower quadrant pelvic pain for 2 weeks. Previous hysterectomy. EXAM: TRANSABDOMINAL AND TRANSVAGINAL ULTRASOUND OF PELVIS DOPPLER ULTRASOUND OF OVARIES TECHNIQUE: Both transabdominal and transvaginal ultrasound examinations of the pelvis were performed. Transabdominal technique was performed for global imaging of the pelvis including uterus, ovaries, adnexal regions, and pelvic cul-de-sac. It was necessary to proceed with endovaginal exam following the transabdominal exam to visualize the ovaries. Color and duplex Doppler ultrasound was utilized to evaluate blood flow to the ovaries. COMPARISON:  CT abdomen and pelvis 05/12/2016 FINDINGS: Uterus Uterus is  surgically absent. Endometrium Surgically absent. Right ovary Measurements: 3.1 x 2 x 2 cm. Simple cyst measuring 1.6 cm maximal diameter, likely functional. No abnormal adnexal masses. Left ovary Measurements: 2.8 x 1.8 x 2 cm. Normal appearance/no adnexal mass. Pulsed Doppler evaluation of the right ovary demonstrates normal low-resistance arterial and venous waveforms. A venous waveform is demonstrated in the left ovary but unable the document arterial flow. This is likely due to poor visualization because of bowel gas. Flow is demonstrated in both ovaries on color flow Doppler imaging. Other findings Small amount of free fluid in the pelvis. IMPRESSION: Uterus is surgically absent. No abnormal adnexal masses. Limited visualization of the left ovary likely accounts for difficulty in obtaining arterial flow waveform with spectral Doppler. Flow is demonstrated in both ovaries on color flow Doppler imaging. Electronically Signed   By: Burman NievesWilliam  Stevens M.D.   On: 10/19/2016 00:24   [place] Pelvis Complete  Result Date: 10/19/2016 CLINICAL DATA:  Left lower quadrant pelvic pain for 2 weeks. Previous hysterectomy. EXAM: TRANSABDOMINAL AND TRANSVAGINAL ULTRASOUND OF PELVIS DOPPLER ULTRASOUND OF OVARIES TECHNIQUE: Both transabdominal and transvaginal ultrasound examinations of the pelvis were performed. Transabdominal technique was performed for global imaging of the pelvis including uterus, ovaries, adnexal regions, and pelvic cul-de-sac. It was necessary to proceed with endovaginal exam following the transabdominal exam to visualize the ovaries. Color and duplex Doppler ultrasound was utilized to evaluate blood flow to the ovaries. COMPARISON:  CT abdomen and pelvis 05/12/2016 FINDINGS: Uterus Uterus is surgically absent. Endometrium Surgically absent. Right ovary Measurements: 3.1 x 2 x 2 cm. Simple cyst measuring 1.6 cm maximal diameter, likely functional. No abnormal adnexal masses. Left ovary Measurements: 2.8 x 1.8  x 2 cm. Normal appearance/no adnexal mass. Pulsed Doppler evaluation of the right ovary demonstrates normal low-resistance arterial and venous waveforms. A venous waveform is demonstrated in the left ovary but unable the document arterial flow. This is likely due to poor visualization because of bowel gas. Flow is demonstrated in both ovaries on color flow Doppler imaging. Other findings Small amount of free fluid in the pelvis. IMPRESSION: Uterus is surgically absent. No abnormal adnexal masses. Limited visualization of the left ovary likely accounts for difficulty in obtaining arterial flow waveform with spectral Doppler. Flow is demonstrated in both ovaries on color flow Doppler imaging. Electronically Signed   By: Burman NievesWilliam  Stevens M.D.   On: 10/19/2016 00:24   [place] Art/ven Flow Abd Pelv Doppler  Result Date:  10/19/2016 CLINICAL DATA:  Left lower quadrant pelvic pain for 2 weeks. Previous hysterectomy. EXAM: TRANSABDOMINAL AND TRANSVAGINAL ULTRASOUND OF PELVIS DOPPLER ULTRASOUND OF OVARIES TECHNIQUE: Both transabdominal and transvaginal ultrasound examinations of the pelvis were performed. Transabdominal technique was performed for global imaging of the pelvis including uterus, ovaries, adnexal regions, and pelvic cul-de-sac. It was necessary to proceed with endovaginal exam following the transabdominal exam to visualize the ovaries. Color and duplex Doppler ultrasound was utilized to evaluate blood flow to the ovaries. COMPARISON:  CT abdomen and pelvis 05/12/2016 FINDINGS: Uterus Uterus is surgically absent. Endometrium Surgically absent. Right ovary Measurements: 3.1 x 2 x 2 cm. Simple cyst measuring 1.6 cm maximal diameter, likely functional. No abnormal adnexal masses. Left ovary Measurements: 2.8 x 1.8 x 2 cm. Normal appearance/no adnexal mass. Pulsed Doppler evaluation of the right ovary demonstrates normal low-resistance arterial and venous waveforms. A venous waveform is demonstrated in the left ovary  but unable the document arterial flow. This is likely due to poor visualization because of bowel gas. Flow is demonstrated in both ovaries on color flow Doppler imaging. Other findings Small amount of free fluid in the pelvis. IMPRESSION: Uterus is surgically absent. No abnormal adnexal masses. Limited visualization of the left ovary likely accounts for difficulty in obtaining arterial flow waveform with spectral Doppler. Flow is demonstrated in both ovaries on color flow Doppler imaging. Electronically Signed   By: Burman Nieves M.D.   On: 10/19/2016 00:24    Procedures Procedures (including critical care time)  Medications Ordered in ED Medications - No data to display   Initial Impression / Assessment and Plan / ED Course  I have reviewed the triage vital signs and the nursing notes.  Pertinent labs & imaging results that were available during my care of the patient were reviewed by me and considered in my medical decision making (see chart for details).  Clinical Course    Kathy Conway is a 41 y.o. female who presents to ED for two complaints:   1. Left lower abdominal pain x 2 weeks. Hx of similar pain which occurs quite frequently. Patient states that she came in today because she is concerned this may be her ovary twisting, however she has a history of IBS and notes similar pain intermittently due to this. She has mild tenderness to left lower abdomen but no focal area of tenderness. No rebound or guarding. No peritoneal signs. She is afebrile and very well appearing. Ultrasound obtained with no evidence of ovarian torsion. Evaluation does not show pathology that would require ongoing emergent intervention or inpatient treatment.   2. Left heel pain: Chronic, seen by PCP for the same and told it was 2/2 bone spur. Symptoms worsened after standing on feet on cement floors at work. Exam with tenderness to the heel but otherwise benign. X-ray negative. Symptomatic home care instructions  discussed.   Patient has PCP appointment on Monday and was encouraged to keep scheduled appointment for further discussion of today's visit. Reasons to return to the ER were discussed and all questions answered.    Final Clinical Impressions(s) / ED Diagnoses   Final diagnoses:  Pain of left heel  Left sided abdominal pain    New Prescriptions Discharge Medication List as of 10/19/2016  1:05 AM     I personally performed the services described in this documentation, which was scribed in my presence. The recorded information has been reviewed and is accurate.    Chase Picket Alan Riles, PA-C 10/19/16 [phone]  Azalia Bilis, MD 10/19/16 [phone]

## 2016-10-19 NOTE — Discharge Instructions (Signed)
Please keep your scheduled appointment with her primary care provider on Monday. Return to the ER for any new or worsening symptoms, any additional concerns.

## 2016-10-23 ENCOUNTER — Ambulatory Visit: Payer: Self-pay | Attending: Family Medicine | Admitting: Family Medicine

## 2016-10-23 ENCOUNTER — Encounter: Payer: Self-pay | Admitting: Family Medicine

## 2016-10-23 VITALS — BP 113/82 | HR 94 | Temp 98.4°F | Ht 66.0 in | Wt 248.2 lb

## 2016-10-23 DIAGNOSIS — M5442 Lumbago with sciatica, left side: Secondary | ICD-10-CM | POA: Insufficient documentation

## 2016-10-23 DIAGNOSIS — Z79899 Other long term (current) drug therapy: Secondary | ICD-10-CM | POA: Insufficient documentation

## 2016-10-23 DIAGNOSIS — G8929 Other chronic pain: Secondary | ICD-10-CM | POA: Insufficient documentation

## 2016-10-23 DIAGNOSIS — F17210 Nicotine dependence, cigarettes, uncomplicated: Secondary | ICD-10-CM | POA: Insufficient documentation

## 2016-10-23 DIAGNOSIS — B356 Tinea cruris: Secondary | ICD-10-CM | POA: Insufficient documentation

## 2016-10-23 DIAGNOSIS — M5441 Lumbago with sciatica, right side: Secondary | ICD-10-CM | POA: Insufficient documentation

## 2016-10-23 DIAGNOSIS — M7989 Other specified soft tissue disorders: Secondary | ICD-10-CM | POA: Insufficient documentation

## 2016-10-23 DIAGNOSIS — K580 Irritable bowel syndrome with diarrhea: Secondary | ICD-10-CM | POA: Insufficient documentation

## 2016-10-23 DIAGNOSIS — M722 Plantar fascial fibromatosis: Secondary | ICD-10-CM | POA: Insufficient documentation

## 2016-10-23 DIAGNOSIS — R197 Diarrhea, unspecified: Secondary | ICD-10-CM | POA: Insufficient documentation

## 2016-10-23 MED ORDER — DIPHENOXYLATE-ATROPINE 2.5-0.025 MG PO TABS: 1.0000 | ORAL_TABLET | Freq: Four times a day (QID) | ORAL | 2 refills | Status: DC | PRN

## 2016-10-23 MED ORDER — IBUPROFEN 800 MG PO TABS: 800.0000 mg | ORAL_TABLET | Freq: Three times a day (TID) | ORAL | 2 refills | Status: DC | PRN

## 2016-10-23 MED ORDER — KETOCONAZOLE 2 % EX CREA: 1.0000 "application " | TOPICAL_CREAM | Freq: Every day | CUTANEOUS | 0 refills | Status: DC

## 2016-10-23 MED FILL — KETOCONAZOLE 2% CREAM: 2 | 20 days supply | Qty: 15 | Fill #0

## 2016-10-23 MED FILL — IBUPROFEN 800 MG TABLET: 800 | 20 days supply | Qty: 60 | Fill #0

## 2016-10-23 NOTE — Progress Notes (Signed)
Subjective:  Patient ID: Kathy Conway, female    DOB: 1976-07-24  Age: 41 y.o. MRN: [id number]  CC: Back Pain and Foot Pain   HPI Kathy Conway presents for   1. Foot pain: x one month. Started on left side. Had swelling in ankle and leg. Pain in posterior heel. No recent injury to left foot. She has history of chronic low back pain for the past 10 years. Pain is getting worse in back. Pain goes across low back. Current pain level is 6/10. She is walking with a cane. She is starting to have pain down her R leg for the past week. She has no fecal or urinary incontinence. She went to hospital on 10/18/16. Found to have small bone spur. She has tried insoles and ibuprofen without relief. She soaks in [facility]. She   She works at [employer] 10-13 hr per week.   2. Chronic back pain: with radicular symptoms. 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 Pain is worse with prolonged standing. No weakness in legs. No fecal or urinary incontinence.    Social History  Substance Use Topics  . Smoking status: Current Every Day Smoker    Packs/day: 0.50    Types: Cigarettes  . Smokeless tobacco: Never Used  . Alcohol use No    Outpatient Medications Prior to Visit  Medication Sig Dispense Refill  . Acetaminophen-Caffeine (EXCEDRIN TENSION HEADACHE) 500-65 MG TABS Take 650 mg by mouth as needed.    . cyclobenzaprine (FLEXERIL) 10 MG tablet Take 1 tablet (10 mg total) by mouth at bedtime. 30 tablet 2  . gabapentin (NEURONTIN) 300 MG capsule Take 1 capsule (300 mg total) by mouth at bedtime. 30 capsule 3  . ibuprofen (ADVIL,MOTRIN) 200 MG tablet Take 800 mg by mouth every 8 (eight) hours as needed for moderate pain.    . cholecalciferol (VITAMIN D) 1000 units tablet Take 2,000 Units by mouth daily.    . diphenoxylate-atropine (LOMOTIL) 2.5-0.025 MG tablet Take 1 tablet by mouth 4 (four) times daily as needed for diarrhea or loose stools. (Patient not taking: Reported on 10/23/2016) 60 tablet 2    . MELATONIN PO Take 2 tablets by mouth at bedtime as needed (sleep). gummys     . predniSONE (DELTASONE) 20 MG tablet Take every morning with food 60 mg daily for 3 days, 40 mg daily for 3 days, 30 mg daily for 3 days, 20 mg daily for 3 days, 10 mg daily for 3 days then STOP (Patient not taking: Reported on 10/23/2016) 24 tablet 0  . vitamin B-12 (CYANOCOBALAMIN) 1000 MCG tablet Take 2,000 mcg by mouth daily. Gummies     No facility-administered medications prior to visit.     ROS Review of Systems  Constitutional: Negative for chills and fever.  HENT: Positive for ear pain.   Eyes: Positive for visual disturbance.  Respiratory: Negative for shortness of breath.   Cardiovascular: Negative for chest pain.  Gastrointestinal: Positive for diarrhea and nausea. Negative for abdominal distention, abdominal pain, anal bleeding, blood in stool, constipation, rectal pain and vomiting.  Musculoskeletal: Positive for arthralgias, back pain and myalgias. Negative for joint swelling.  Skin: Positive for color change and rash.  Allergic/Immunologic: Negative for immunocompromised state.  Neurological: Positive for dizziness and headaches. Negative for tremors, seizures, syncope, facial asymmetry, speech difficulty, weakness, light-headedness and numbness.  Hematological: Negative for adenopathy. Does not bruise/bleed easily.  Psychiatric/Behavioral: Positive for dysphoric mood. Negative for suicidal ideas. The patient is nervous/anxious.  Objective:  BP 113/82 (BP Location: Left Arm, Patient Position: Sitting, Cuff Size: Small)   Pulse 94   Temp 98.4 F (36.9 C) (Oral)   Ht 5\' 6"  (1.676 m)   Wt 248 lb 3.2 oz (112.6 kg)   SpO2 96%   BMI 40.06 kg/m   BP/Weight 10/23/2016 10/19/2016 10/18/2016  Systolic BP 113 137 -  Diastolic BP 82 87 -  Wt. (Lbs) 248.2 - 252  BMI 40.06 - 40.67    Physical Exam  Constitutional: She is oriented to person, place, and time. She appears well-developed and  well-nourished. No distress.  HENT:  Head: Normocephalic and atraumatic.  Cardiovascular: Normal rate, regular rhythm, normal heart sounds and intact distal pulses.   Pulmonary/Chest: Effort normal and breath sounds normal.  Musculoskeletal: She exhibits no edema.  Neurological: She is alert and oriented to person, place, and time.  Skin: Skin is warm and dry. No rash noted.     Psychiatric: She has a normal mood and affect.     Assessment & Plan:  Kathy Conway was seen today for back pain and foot pain.  Diagnoses and all orders for this visit:  Chronic bilateral low back pain with bilateral sciatica -     ibuprofen (ADVIL,MOTRIN) 800 MG tablet; Take 1 tablet (800 mg total) by mouth every 8 (eight) hours as needed for moderate pain. -     DG Lumbar Spine 2-3 Views; Future  Irritable bowel syndrome with diarrhea -     diphenoxylate-atropine (LOMOTIL) 2.5-0.025 MG tablet; Take 1 tablet by mouth 4 (four) times daily as needed for diarrhea or loose stools.  Plantar fasciitis, left -     ibuprofen (ADVIL,MOTRIN) 800 MG tablet; Take 1 tablet (800 mg total) by mouth every 8 (eight) hours as needed for moderate pain. -     Ambulatory referral to Podiatry  Tinea cruris -     ketoconazole (NIZORAL) 2 % cream; Apply 1 application topically daily.    No orders of the defined types were placed in this encounter.   Follow-up: Return in about 6 weeks (around 12/04/2016) for chronic back pain and L plantar fascitis .   Dessa PhiJosalyn [place] MD

## 2016-10-23 NOTE — Patient Instructions (Addendum)
Kathy Conway was seen today for back pain and foot pain.  Diagnoses and all orders for this visit:  Chronic bilateral low back pain with bilateral sciatica -     ibuprofen (ADVIL,MOTRIN) 800 MG tablet; Take 1 tablet (800 mg total) by mouth every 8 (eight) hours as needed for moderate pain. -     DG Lumbar Spine 2-3 Views; Future  Irritable bowel syndrome with diarrhea -     diphenoxylate-atropine (LOMOTIL) 2.5-0.025 MG tablet; Take 1 tablet by mouth 4 (four) times daily as needed for diarrhea or loose stools.  Plantar fasciitis, left -     ibuprofen (ADVIL,MOTRIN) 800 MG tablet; Take 1 tablet (800 mg total) by mouth every 8 (eight) hours as needed for moderate pain. -     Ambulatory referral to Podiatry  Tinea cruris -     ketoconazole (NIZORAL) 2 % cream; Apply 1 application topically daily.   F/u in 6 weeks for chronic back pain and L foot plantar fascitis   Dr. Armen PickupFunches   [facility]  Ice feet for 15 minutes 2-3 times per day by doing the following  1. Freezing water in plastic bottle and rolling bottle under foot Or Placing an ice pack under under heel

## 2016-11-02 MED FILL — GABAPENTIN 300 MG CAPSULE: 300 | 30 days supply | Qty: 30 | Fill #1

## 2016-11-02 MED FILL — CYCLOBENZAPRINE 10 MG TAB: 10 | 30 days supply | Qty: 30 | Fill #1

## 2017-02-13 ENCOUNTER — Emergency Department (HOSPITAL_COMMUNITY)
Admission: EM | Admit: 2017-02-13 | Discharge: 2017-02-13 | Disposition: A | Discharge status: Home / Self Care | Payer: Self-pay | Attending: Emergency Medicine | Admitting: Emergency Medicine

## 2017-02-13 ENCOUNTER — Encounter (HOSPITAL_COMMUNITY): Payer: Self-pay | Admitting: Emergency Medicine

## 2017-02-13 DIAGNOSIS — Z79899 Other long term (current) drug therapy: Secondary | ICD-10-CM | POA: Insufficient documentation

## 2017-02-13 DIAGNOSIS — M722 Plantar fascial fibromatosis: Secondary | ICD-10-CM | POA: Insufficient documentation

## 2017-02-13 DIAGNOSIS — J45909 Unspecified asthma, uncomplicated: Secondary | ICD-10-CM | POA: Insufficient documentation

## 2017-02-13 DIAGNOSIS — Z91040 Latex allergy status: Secondary | ICD-10-CM | POA: Insufficient documentation

## 2017-02-13 DIAGNOSIS — F17210 Nicotine dependence, cigarettes, uncomplicated: Secondary | ICD-10-CM | POA: Insufficient documentation

## 2017-02-13 NOTE — Discharge Instructions (Signed)
Please read and follow all provided instructions.  Your diagnoses today include:  1. Plantar fasciitis     Tests performed today include: Vital signs. See below for your results today.   Medications prescribed:  Take as prescribed   Home care instructions:  Follow any educational materials contained in this packet.  Follow-up instructions: Please follow-up with Podiatry for further evaluation of symptoms and treatment   Return instructions:  Please return to the Emergency Department if you do not get better, if you get worse, or new symptoms OR  - Fever (temperature greater than 101.60F)  - Bleeding that does not stop with holding pressure to the area    -Severe pain (please note that you may be more sore the day after your accident)  - Chest Pain  - Difficulty breathing  - Severe nausea or vomiting  - Inability to tolerate food and liquids  - Passing out  - Skin becoming red around your wounds  - Change in mental status (confusion or lethargy)  - New numbness or weakness    Please return if you have any other emergent concerns.  Additional Information:  Your vital signs today were: BP 127/85 (BP Location: Left Arm)    Pulse 94    Temp 99 F (37.2 C) (Oral)    Resp 18    SpO2 97%  If your blood pressure (BP) was elevated above 135/85 this visit, please have this repeated by your doctor within one month. ---------------

## 2017-02-13 NOTE — ED Triage Notes (Signed)
Pt to ER for left knee pain x "months", states was told she has a bone spur to left heel causing extreme pain. States she cannot follow up with ortho and needs help managing the pain. Pt is in NAD.

## 2017-02-13 NOTE — ED Provider Notes (Signed)
[facility]-EMERGENCY DEPT Provider Note   CSN: [id number] Arrival date & time: 02/13/17  1755  By signing my name below, I, Phillips Climes, attest that this documentation has been prepared under the direction and in the presence of Audry Pili, PA-C.  Electronically Signed: Phillips Climes, Scribe. 02/13/2017. 7:42 PM.  History   Chief Complaint Chief Complaint  Patient presents with  . Foot Pain   HPI Kathy Conway is a 41 y.o. female with a PMHx significant for obesity and DDD who presents to the Emergency Department with complaints of her chronic left foot pain 2/2 pts "known" reported bone spur. No new injury or trauma. No relief with orthopedic foot inserts. Pt with negative x-ray films on 07/17/2016 and 10/18/2016. Pt's additional complaint is her chronic back pain. She admits to walking more than her usual baseline, but is able to ambulate without difficulty or major complaints. Pt states it is likely from her left ankle pain and compensating for pain. Pt denies experiencing any other acute sx, including abdominal pain, fever, nausea, vomiting, bowel or bladder incontinence. Pt is up and ambulating without complaint or assistance in the ED.   Past Medical History:  Diagnosis Date  . Asthma 10/1993  . Fibromyalgia   . H/O degenerative disc disease   . IBS (irritable bowel syndrome) 10/2003  . Migraine 10/1996  . Renal disorder   . Vertigo 10/2009    Patient Active Problem List   Diagnosis Date Noted  . Plantar fasciitis, left 10/23/2016  . Chronic bilateral low back pain with bilateral sciatica 09/08/2016  . IBS (irritable bowel syndrome) 05/23/2016  . Asthma   . Paresthesia of both hands 03/10/2016  . Boils 03/10/2016  . Anxiety state 03/10/2016  . H/O degenerative disc disease   . Fibromyalgia   . Vertigo 10/09/2009  . Migraine 10/09/1996    Past Surgical History:  Procedure Laterality Date  . ABDOMINAL HYSTERECTOMY    . CHOLECYSTECTOMY    . TUBAL LIGATION      OB  History    Gravida Para Term Preterm AB Living   2 2       2    SAB TAB Ectopic Multiple Live Births                   Home Medications    Prior to Admission medications   Medication Sig Start Date End Date Taking? Authorizing Provider  Acetaminophen-Caffeine (EXCEDRIN TENSION HEADACHE) 500-65 MG TABS Take 650 mg by mouth as needed.    [provider]  cholecalciferol (VITAMIN D) 1000 units tablet Take 2,000 Units by mouth daily.    [provider]  cyclobenzaprine (FLEXERIL) 10 MG tablet Take 1 tablet (10 mg total) by mouth at bedtime. 09/07/16   Funches, Gerilyn Nestle, MD  diphenoxylate-atropine (LOMOTIL) 2.5-0.025 MG tablet Take 1 tablet by mouth 4 (four) times daily as needed for diarrhea or loose stools. 10/23/16   Funches, Gerilyn Nestle, MD  gabapentin (NEURONTIN) 300 MG capsule Take 1 capsule (300 mg total) by mouth at bedtime. 09/07/16   Funches, Gerilyn Nestle, MD  ibuprofen (ADVIL,MOTRIN) 800 MG tablet Take 1 tablet (800 mg total) by mouth every 8 (eight) hours as needed for moderate pain. 10/23/16   Funches, Gerilyn Nestle, MD  ketoconazole (NIZORAL) 2 % cream Apply 1 application topically daily. 10/23/16   Funches, Gerilyn Nestle, MD  MELATONIN PO Take 2 tablets by mouth at bedtime as needed (sleep). gummys     [provider]  vitamin B-12 (CYANOCOBALAMIN) 1000 MCG  tablet Take 2,000 mcg by mouth daily. Gummies    [provider]    Family History Family History  Problem Relation Age of Onset  . Cancer Mother   . Cancer Father   . Diabetes Maternal Grandfather     Social History Social History  Substance Use Topics  . Smoking status: Current Every Day Smoker    Packs/day: 0.50    Types: Cigarettes  . Smokeless tobacco: Never Used  . Alcohol use No     Allergies   Codeine; Vicodin [hydrocodone-acetaminophen]; Iodine; and Latex   Review of Systems Review of Systems  Constitutional: Negative for fever.  Gastrointestinal: Negative for abdominal pain, diarrhea  and vomiting.  Musculoskeletal: Positive for back pain (Chronic).       Foot pain (chronic)  Neurological: Negative for weakness and numbness.   Physical Exam Updated Vital Signs BP 127/85 (BP Location: Left Arm)   Pulse 94   Temp 99 F (37.2 C) (Oral)   Resp 18   SpO2 97%   Physical Exam  Constitutional: She is oriented to person, place, and time. She appears well-developed and well-nourished. No distress.  HENT:  Head: Normocephalic and atraumatic.  Cardiovascular: Normal rate.   Pulmonary/Chest: Effort normal.  Musculoskeletal:  Left foot with no edema. FROM of joints. No pain with passive range of motion. Pt able to weight bear and ambulate in room. TTP along plantar fascia. Also TTP along medial calcaneal insertion. No swelling. No erythema  Neurological: She is alert and oriented to person, place, and time.  Skin: Skin is warm and dry.  Psychiatric: She has a normal mood and affect.  Nursing note and vitals reviewed.  ED Treatments / Results  DIAGNOSTIC STUDIES: Oxygen Saturation is 97% on RA, adequate by my interpretation.    COORDINATION OF CARE: 7:42 PM Discussed treatment plan with pt at bedside and pt agreed to plan.  Labs (all labs ordered are listed, but only abnormal results are displayed) Labs Reviewed - No data to display  EKG  EKG Interpretation None       Radiology No results found.  Procedures Procedures (including critical care time)  Medications Ordered in ED Medications - No data to display   Initial Impression / Assessment and Plan / ED Course  I have reviewed the triage vital signs and the nursing notes.  Pertinent labs & imaging results that were available during my care of the patient were reviewed by me and considered in my medical decision making (see chart for details).  Final Clinical Impressions(s) / ED Diagnoses   {I have reviewed and evaluated the relevant imaging studies.  {I have reviewed the relevant previous healthcare  records.  {I obtained HPI from historian.   ED Course:  Assessment: Pt here with chronic foot pain. Negative X-rays on 07/17/2016 and 10/18/2016. Pain is worse with standing and pt has been walking more than usual to her bus stop.   Patient with chronic back pain.  No neurological deficits and normal neuro exam.  Patient is ambulatory.  No loss of bowel or bladder control.  No concern for cauda equina.  No fever, night sweats, weight loss, h/o cancer, IVDA, no recent procedure to back. No urinary symptoms suggestive of UTI.  Supportive care and return precaution discussed. Appears safe for discharge at this time. Follow up as indicated in discharge paperwork.   Patients symptoms likely related to chronic bone spur as well as plantar fascitis. Counseled on symptomatic care. Will give pt resources for  outpatient f/u.   Disposition/Plan:  DC Home Additional Verbal discharge instructions given and discussed with patient.  Pt Instructed to f/u with Podiatry in the next week for evaluation and treatment of symptoms. Return precautions given Pt acknowledges and agrees with plan  Supervising Physician Raeford RazorKohut, Stephen, MD  Final diagnoses:  Plantar fasciitis    New Prescriptions New Prescriptions   No medications on file   I personally performed the services described in this documentation, which was scribed in my presence. The recorded information has been reviewed and is accurate.    Audry PiliMohr, Hadden Steig, PA-C 02/13/17 2001    Raeford RazorKohut, Stephen, MD 02/13/17 2109

## 2017-02-13 NOTE — ED Notes (Signed)
PA-C to see and assess patient prior to RN assessment. See EDP note.  

## 2017-02-13 NOTE — ED Notes (Signed)
Discharge vitals taken. Awaiting discharge paperwork.

## 2017-02-13 NOTE — ED Notes (Signed)
Patient verbalized understanding of discharge instructions and denies any further needs or questions at this time. VS stable. Patient ambulatory with steady gait, assisted to ED entrance in wheelchair.  

## 2017-02-21 ENCOUNTER — Encounter: Payer: Self-pay | Admitting: Family Medicine

## 2017-03-01 ENCOUNTER — Encounter: Payer: Self-pay | Admitting: Family Medicine

## 2017-03-01 ENCOUNTER — Ambulatory Visit: Payer: Self-pay | Attending: Family Medicine | Admitting: Family Medicine

## 2017-03-01 VITALS — BP 117/80 | HR 103 | Temp 97.3°F | Wt 260.0 lb

## 2017-03-01 DIAGNOSIS — Z6841 Body Mass Index (BMI) 40.0 and over, adult: Secondary | ICD-10-CM | POA: Insufficient documentation

## 2017-03-01 DIAGNOSIS — M5441 Lumbago with sciatica, right side: Secondary | ICD-10-CM | POA: Insufficient documentation

## 2017-03-01 DIAGNOSIS — E669 Obesity, unspecified: Secondary | ICD-10-CM | POA: Insufficient documentation

## 2017-03-01 DIAGNOSIS — F17210 Nicotine dependence, cigarettes, uncomplicated: Secondary | ICD-10-CM | POA: Insufficient documentation

## 2017-03-01 DIAGNOSIS — G4700 Insomnia, unspecified: Secondary | ICD-10-CM | POA: Insufficient documentation

## 2017-03-01 DIAGNOSIS — M722 Plantar fascial fibromatosis: Secondary | ICD-10-CM | POA: Insufficient documentation

## 2017-03-01 DIAGNOSIS — J45909 Unspecified asthma, uncomplicated: Secondary | ICD-10-CM | POA: Insufficient documentation

## 2017-03-01 DIAGNOSIS — Z79899 Other long term (current) drug therapy: Secondary | ICD-10-CM | POA: Insufficient documentation

## 2017-03-01 DIAGNOSIS — M5442 Lumbago with sciatica, left side: Secondary | ICD-10-CM | POA: Insufficient documentation

## 2017-03-01 DIAGNOSIS — G8929 Other chronic pain: Secondary | ICD-10-CM | POA: Insufficient documentation

## 2017-03-01 DIAGNOSIS — F419 Anxiety disorder, unspecified: Secondary | ICD-10-CM | POA: Insufficient documentation

## 2017-03-01 MED ORDER — GABAPENTIN 300 MG PO CAPS: 300.0000 mg | ORAL_CAPSULE | Freq: Every day | ORAL | 3 refills | Status: DC

## 2017-03-01 MED ORDER — CYCLOBENZAPRINE HCL 10 MG PO TABS: 10.0000 mg | ORAL_TABLET | Freq: Every day | ORAL | 2 refills | Status: DC

## 2017-03-01 MED ORDER — TRAZODONE HCL 50 MG PO TABS: 25.0000 mg | ORAL_TABLET | Freq: Every evening | ORAL | 2 refills | Status: DC | PRN

## 2017-03-01 MED ORDER — DICLOFENAC SODIUM 75 MG PO TBEC: 75.0000 mg | DELAYED_RELEASE_TABLET | Freq: Two times a day (BID) | ORAL | 0 refills | Status: DC

## 2017-03-01 MED FILL — ?CYCLOBENZAPRINE 10 MG TABL: 10 | 30 days supply | Qty: 30 | Fill #0

## 2017-03-01 MED FILL — ?TRAZODONE 50 MG TABLET: 50 | 30 days supply | Qty: 30 | Fill #0

## 2017-03-01 MED FILL — GABAPENTIN 300 MG CAPSULE: 300 | 30 days supply | Qty: 30 | Fill #0

## 2017-03-01 MED FILL — ?DICLOFENAC SOD DR 75 MG TA: 75 | 15 days supply | Qty: 30 | Fill #0

## 2017-03-01 NOTE — Progress Notes (Signed)
Subjective:  Patient ID: Kathy GoodpastureAndria D Pompa, female    DOB: 1976-04-28  Age: 41 y.o. MRN: [id number]  CC: Follow-up   HPI Kathy Goodpasturendria D Fasig  Has, obesity,  chronic back pain, asthma, anxiety she presents for   1. ED follow up chronic L foot pain: she was seen in the ED on 02/13/2017. She also complained of chronic Left heel pain. She had recent x-ray of L ankle in 10/18/2016 that revealed small Achilles and plantar calcaneal spurs. No imaging was done in the ED. No new medications. She was advised to follow up as an outpatient. She is uninsured and did not see the podiatrist. He is obese and gaining weight. She admits to high sugar sweetened drinks intake.   2. Chronic back pain: with radicular symptoms. 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 Pain is worse with prolonged standing. No weakness in legs. No fecal or urinary incontinence.    Social History  Substance Use Topics  . Smoking status: Current Every Day Smoker    Packs/day: 0.50    Types: Cigarettes  . Smokeless tobacco: Never Used  . Alcohol use No    Outpatient Medications Prior to Visit  Medication Sig Dispense Refill  . Acetaminophen-Caffeine (EXCEDRIN TENSION HEADACHE) 500-65 MG TABS Take 650 mg by mouth as needed.    . cholecalciferol (VITAMIN D) 1000 units tablet Take 2,000 Units by mouth daily.    . cyclobenzaprine (FLEXERIL) 10 MG tablet Take 1 tablet (10 mg total) by mouth at bedtime. 30 tablet 2  . diphenoxylate-atropine (LOMOTIL) 2.5-0.025 MG tablet Take 1 tablet by mouth 4 (four) times daily as needed for diarrhea or loose stools. 60 tablet 2  . gabapentin (NEURONTIN) 300 MG capsule Take 1 capsule (300 mg total) by mouth at bedtime. 30 capsule 3  . ibuprofen (ADVIL,MOTRIN) 800 MG tablet Take 1 tablet (800 mg total) by mouth every 8 (eight) hours as needed for moderate pain. 60 tablet 2  . ketoconazole (NIZORAL) 2 % cream Apply 1 application topically daily. 15 g 0  . MELATONIN PO Take 2 tablets by mouth at bedtime as needed  (sleep). gummys     . vitamin B-12 (CYANOCOBALAMIN) 1000 MCG tablet Take 2,000 mcg by mouth daily. Gummies     No facility-administered medications prior to visit.     ROS Review of Systems  Constitutional: Negative for chills and fever.  HENT: Negative for ear pain.   Eyes: Negative for visual disturbance.  Respiratory: Negative for shortness of breath.   Cardiovascular: Negative for chest pain.  Gastrointestinal: Negative for abdominal distention, abdominal pain, anal bleeding, blood in stool, constipation, diarrhea, nausea, rectal pain and vomiting.  Musculoskeletal: Positive for arthralgias, back pain and myalgias. Negative for joint swelling.  Skin: Positive for color change and rash.  Allergic/Immunologic: Negative for immunocompromised state.  Neurological: Negative for dizziness, tremors, seizures, syncope, facial asymmetry, speech difficulty, weakness, light-headedness, numbness and headaches.  Hematological: Negative for adenopathy. Does not bruise/bleed easily.  Psychiatric/Behavioral: Positive for dysphoric mood and sleep disturbance. Negative for suicidal ideas. The patient is nervous/anxious.     Objective:  BP 117/80   Pulse (!) 103   Temp 97.3 F (36.3 C) (Oral)   Wt 260 lb (117.9 kg)   SpO2 98%   BMI 41.97 kg/m   BP/Weight 03/01/2017 02/13/2017 10/23/2016  Systolic BP 117 121 113  Diastolic BP 80 77 82  Wt. (Lbs) 260 - 248.2  BMI 41.97 - 40.06    Physical Exam  Constitutional: She is  oriented to person, place, and time. She appears well-developed and well-nourished. No distress.  HENT:  Head: Normocephalic and atraumatic.  Cardiovascular: Normal rate, regular rhythm, normal heart sounds and intact distal pulses.   Pulmonary/Chest: Effort normal and breath sounds normal.  Musculoskeletal: She exhibits no edema.       Lumbar back: She exhibits tenderness (b/l lumbar spine ).       Left foot: There is tenderness and swelling. There is normal range of motion,  no bony tenderness, normal capillary refill, no crepitus, no deformity and no laceration.       Feet:  Neurological: She is alert and oriented to person, place, and time.  Skin: Skin is warm and dry. No rash noted.  Psychiatric: She has a normal mood and affect.    Assessment & Plan:  Simrin was seen today for follow-up.  Diagnoses and all orders for this visit:  Plantar fasciitis, left -     diclofenac (VOLTAREN) 75 MG EC tablet; Take 1 tablet (75 mg total) by mouth 2 (two) times daily.  Insomnia, unspecified type -     traZODone (DESYREL) 50 MG tablet; Take 0.5-1 tablets (25-50 mg total) by mouth at bedtime as needed for sleep.  Chronic bilateral low back pain with bilateral sciatica -     cyclobenzaprine (FLEXERIL) 10 MG tablet; Take 1 tablet (10 mg total) by mouth at bedtime. -     gabapentin (NEURONTIN) 300 MG capsule; Take 1 capsule (300 mg total) by mouth at bedtime.    No orders of the defined types were placed in this encounter.   Follow-up: Return in about 2 weeks (around 03/15/2017) for plantar fascitis .   Dessa Phi MD

## 2017-03-01 NOTE — Patient Instructions (Addendum)
Kathy Conway was seen today for follow-up.  Diagnoses and all orders for this visit:  Plantar fasciitis, left -     diclofenac (VOLTAREN) 75 MG EC tablet; Take 1 tablet (75 mg total) by mouth 2 (two) times daily.  Insomnia, unspecified type -     traZODone (DESYREL) 50 MG tablet; Take 0.5-1 tablets (25-50 mg total) by mouth at bedtime as needed for sleep.  Chronic bilateral low back pain with bilateral sciatica -     cyclobenzaprine (FLEXERIL) 10 MG tablet; Take 1 tablet (10 mg total) by mouth at bedtime. -     gabapentin (NEURONTIN) 300 MG capsule; Take 1 capsule (300 mg total) by mouth at bedtime.   Work on weight loss Cut out sugar sweetened drinks   Please schedule patient for podiatry clinic in the evenings  F/u with me in 2 weeks for plantar fasciitis   Dr. Armen PickupFunches

## 2017-03-05 DIAGNOSIS — G4700 Insomnia, unspecified: Secondary | ICD-10-CM | POA: Insufficient documentation

## 2017-03-05 NOTE — Assessment & Plan Note (Signed)
Recurrent plantar fasciitis Plan: Diclofenac Home PT Weight loss Referral to evening podiatry clinic

## 2017-03-05 NOTE — Assessment & Plan Note (Signed)
Chronic insomnia Trazodone initiated

## 2017-03-05 NOTE — Assessment & Plan Note (Signed)
Chronic pain In obese female with weight gain Plan: Weight loss Continue gabapentin and flexeril

## 2017-03-22 ENCOUNTER — Encounter: Payer: Self-pay | Admitting: Family Medicine

## 2017-03-22 ENCOUNTER — Ambulatory Visit: Payer: Self-pay | Attending: Family Medicine | Admitting: Family Medicine

## 2017-03-22 VITALS — BP 121/85 | HR 111 | Temp 97.9°F | Wt 259.4 lb

## 2017-03-22 DIAGNOSIS — J45909 Unspecified asthma, uncomplicated: Secondary | ICD-10-CM | POA: Insufficient documentation

## 2017-03-22 DIAGNOSIS — G8929 Other chronic pain: Secondary | ICD-10-CM | POA: Insufficient documentation

## 2017-03-22 DIAGNOSIS — F17210 Nicotine dependence, cigarettes, uncomplicated: Secondary | ICD-10-CM | POA: Insufficient documentation

## 2017-03-22 DIAGNOSIS — M549 Dorsalgia, unspecified: Secondary | ICD-10-CM | POA: Insufficient documentation

## 2017-03-22 DIAGNOSIS — H9193 Unspecified hearing loss, bilateral: Secondary | ICD-10-CM | POA: Insufficient documentation

## 2017-03-22 DIAGNOSIS — R42 Dizziness and giddiness: Secondary | ICD-10-CM | POA: Insufficient documentation

## 2017-03-22 DIAGNOSIS — Z79899 Other long term (current) drug therapy: Secondary | ICD-10-CM | POA: Insufficient documentation

## 2017-03-22 DIAGNOSIS — F909 Attention-deficit hyperactivity disorder, unspecified type: Secondary | ICD-10-CM | POA: Insufficient documentation

## 2017-03-22 DIAGNOSIS — E669 Obesity, unspecified: Secondary | ICD-10-CM | POA: Insufficient documentation

## 2017-03-22 DIAGNOSIS — M722 Plantar fascial fibromatosis: Secondary | ICD-10-CM | POA: Insufficient documentation

## 2017-03-22 DIAGNOSIS — F4310 Post-traumatic stress disorder, unspecified: Secondary | ICD-10-CM | POA: Insufficient documentation

## 2017-03-22 DIAGNOSIS — Z6841 Body Mass Index (BMI) 40.0 and over, adult: Secondary | ICD-10-CM | POA: Insufficient documentation

## 2017-03-22 DIAGNOSIS — F319 Bipolar disorder, unspecified: Secondary | ICD-10-CM | POA: Insufficient documentation

## 2017-03-22 DIAGNOSIS — M79671 Pain in right foot: Secondary | ICD-10-CM

## 2017-03-22 DIAGNOSIS — F411 Generalized anxiety disorder: Secondary | ICD-10-CM | POA: Insufficient documentation

## 2017-03-22 DIAGNOSIS — R000 Tachycardia, unspecified: Secondary | ICD-10-CM | POA: Insufficient documentation

## 2017-03-22 DIAGNOSIS — Z114 Encounter for screening for human immunodeficiency virus [HIV]: Secondary | ICD-10-CM

## 2017-03-22 MED ORDER — DICLOFENAC SODIUM 75 MG PO TBEC: 75.0000 mg | DELAYED_RELEASE_TABLET | Freq: Two times a day (BID) | ORAL | 0 refills | Status: DC

## 2017-03-22 MED ORDER — MECLIZINE HCL 25 MG PO TABS: 25.0000 mg | ORAL_TABLET | Freq: Three times a day (TID) | ORAL | 2 refills | Status: DC | PRN

## 2017-03-22 MED ORDER — DICLOFENAC SODIUM 75 MG PO TBEC
75.0000 mg | DELAYED_RELEASE_TABLET | Freq: Two times a day (BID) | ORAL | 0 refills | Status: DC
Start: 2017-03-22 — End: 2017-03-22

## 2017-03-22 NOTE — Patient Instructions (Addendum)
Kathy Conway was seen today for follow-up.  Diagnoses and all orders for this visit:  Acute pain of right foot -     DG Foot Complete Right; Future  Plantar fasciitis, left -     diclofenac (VOLTAREN) 75 MG EC tablet; Take 1 tablet (75 mg total) by mouth 2 (two) times daily.  Bilateral hearing loss, unspecified hearing loss type -     Ambulatory referral to Audiology  Anxiety state -     Ambulatory referral to Psychiatry  Vertigo -     meclizine (ANTIVERT) 25 MG tablet; Take 1 tablet (25 mg total) by mouth 3 (three) times daily as needed.   F/u in  3 weeks for R foot pain   Dr. Armen PickupFunches

## 2017-03-22 NOTE — Progress Notes (Signed)
Subjective:  Patient ID: Kathy Conway, female    DOB: 1976-07-24  Age: 41 y.o. MRN: [id number]  CC: Follow-up   HPI Kathy Goodpasturendria D Brodhead  Has, obesity,  chronic back pain, asthma, anxiety,vertigo, mood disorder  she presents with her boyfriend for   1. plantar fasciitis  L foot: she was seen in the ED on 02/13/2017. She also complained of chronic Left heel pain. She had recent x-ray of L ankle in 10/18/2016 that revealed small Achilles and plantar calcaneal spurs.  She is uninsured and did not see the podiatrist. She has not applied for the orange card or [place] discount to gain access. She is obese.   She report course of voltaren gel has helped her L heel pain. She now reports R dorsal lateral from MTP joint to mid food. No injury. No bruising or swelling.   2. Hearing loss: she reports L ear hearing loss since age 89 following febrile illness. She reports over the past several years she has decreased hearing in her R ear also. She has long standing vertigo. No ringing in ears.   3. Mood disorder: she reports longstanding anxiety, depression, bipolar disorder, ADHD and PTSD. She is taking trazodone to help her sleep. She is not seeing mental health. She reports her mood and lack of focus interfere with her ability to work. She has 2 children, 125 yo son and 39 yo daughter. Her daughter lives with her father.   Social History  Substance Use Topics  . Smoking status: Current Every Day Smoker    Packs/day: 0.50    Types: Cigarettes  . Smokeless tobacco: Never Used  . Alcohol use No    Outpatient Medications Prior to Visit  Medication Sig Dispense Refill  . Acetaminophen-Caffeine (EXCEDRIN TENSION HEADACHE) 500-65 MG TABS Take 650 mg by mouth as needed.    . cyclobenzaprine (FLEXERIL) 10 MG tablet Take 1 tablet (10 mg total) by mouth at bedtime. 30 tablet 2  . diclofenac (VOLTAREN) 75 MG EC tablet Take 1 tablet (75 mg total) by mouth 2 (two) times daily. 30 tablet 0  .  diphenoxylate-atropine (LOMOTIL) 2.5-0.025 MG tablet Take 1 tablet by mouth 4 (four) times daily as needed for diarrhea or loose stools. 60 tablet 2  . gabapentin (NEURONTIN) 300 MG capsule Take 1 capsule (300 mg total) by mouth at bedtime. 30 capsule 3  . ketoconazole (NIZORAL) 2 % cream Apply 1 application topically daily. 15 g 0  . traZODone (DESYREL) 50 MG tablet Take 0.5-1 tablets (25-50 mg total) by mouth at bedtime as needed for sleep. 30 tablet 2  . vitamin B-12 (CYANOCOBALAMIN) 1000 MCG tablet Take 2,000 mcg by mouth daily. Gummies    . cholecalciferol (VITAMIN D) 1000 units tablet Take 2,000 Units by mouth daily.    Marland Kitchen. MELATONIN PO Take 2 tablets by mouth at bedtime as needed (sleep). gummys      No facility-administered medications prior to visit.     ROS Review of Systems  Constitutional: Negative for chills and fever.  HENT: Positive for hearing loss. Negative for ear pain.   Eyes: Negative for visual disturbance.  Respiratory: Negative for shortness of breath.   Cardiovascular: Negative for chest pain.  Gastrointestinal: Negative for abdominal distention, abdominal pain, anal bleeding, blood in stool, constipation, diarrhea, nausea, rectal pain and vomiting.  Musculoskeletal: Positive for arthralgias, back pain and myalgias. Negative for joint swelling.  Skin: Negative for color change and rash.  Allergic/Immunologic: Negative for immunocompromised state.  Neurological: Positive for dizziness. Negative for tremors, seizures, syncope, facial asymmetry, speech difficulty, weakness, light-headedness, numbness and headaches.  Hematological: Negative for adenopathy. Does not bruise/bleed easily.  Psychiatric/Behavioral: Positive for dysphoric mood and sleep disturbance. Negative for suicidal ideas. The patient is nervous/anxious.     Objective:  BP 121/85   Pulse (!) 111   Temp 97.9 F (36.6 C) (Oral)   Wt 259 lb 6.4 oz (117.7 kg)   SpO2 97%   BMI 41.87 kg/m   BP/Weight  03/22/2017 03/01/2017 02/13/2017  Systolic BP 121 117 121  Diastolic BP 85 80 77  Wt. (Lbs) 259.4 260 -  BMI 41.87 41.97 -   Pulse Readings from Last 3 Encounters:  03/22/17 (!) 111  03/01/17 (!) 103  02/13/17 88    Physical Exam  Constitutional: She is oriented to person, place, and time. She appears well-developed and well-nourished. No distress.  HENT:  Head: Normocephalic and atraumatic.  Right Ear: Tympanic membrane, external ear and ear canal normal. Decreased hearing is noted.  Left Ear: Tympanic membrane, external ear and ear canal normal. Decreased hearing is noted.  Cardiovascular: Normal rate, regular rhythm, normal heart sounds and intact distal pulses.   Pulmonary/Chest: Effort normal and breath sounds normal.  Musculoskeletal: She exhibits no edema.       Lumbar back: She exhibits tenderness (b/l lumbar spine ).       Left foot: There is tenderness and swelling. There is normal range of motion, no bony tenderness, normal capillary refill, no crepitus, no deformity and no laceration.       Feet:  Neurological: She is alert and oriented to person, place, and time.  Skin: Skin is warm and dry. No rash noted.  Psychiatric: She has a normal mood and affect.   No results found for: TSH  Assessment & Plan:  Aubrionna was seen today for follow-up.  Diagnoses and all orders for this visit:  Acute pain of right foot -     DG Foot Complete Right; Future  Plantar fasciitis, left -     Discontinue: diclofenac (VOLTAREN) 75 MG EC tablet; Take 1 tablet (75 mg total) by mouth 2 (two) times daily. -     diclofenac (VOLTAREN) 75 MG EC tablet; Take 1 tablet (75 mg total) by mouth 2 (two) times daily.  Bilateral hearing loss, unspecified hearing loss type -     Ambulatory referral to Audiology  Anxiety state -     Ambulatory referral to Psychiatry  Vertigo -     Discontinue: meclizine (ANTIVERT) 25 MG tablet; Take 1 tablet (25 mg total) by mouth 3 (three) times daily as  needed. -     meclizine (ANTIVERT) 25 MG tablet; Take 1 tablet (25 mg total) by mouth 3 (three) times daily as needed.  Tachycardia -     CBC; Future -     TSH; Future  Screening for HIV (human immunodeficiency virus) -     HIV antibody (with reflex); Future    No orders of the defined types were placed in this encounter.   Follow-up: Return in about 3 weeks (around 04/12/2017) for R fot pain.   Dessa Phi MD

## 2017-03-24 DIAGNOSIS — H9193 Unspecified hearing loss, bilateral: Secondary | ICD-10-CM | POA: Insufficient documentation

## 2017-03-24 DIAGNOSIS — M79671 Pain in right foot: Secondary | ICD-10-CM | POA: Insufficient documentation

## 2017-03-24 DIAGNOSIS — R000 Tachycardia, unspecified: Secondary | ICD-10-CM | POA: Insufficient documentation

## 2017-03-24 NOTE — Assessment & Plan Note (Signed)
X-ray ordered.

## 2017-03-24 NOTE — Assessment & Plan Note (Signed)
Chronic L side Subacute R side Worsening Normal ear exam Audiology referral placed

## 2017-03-24 NOTE — Assessment & Plan Note (Signed)
Noted on last two visit TSH and CBC to evaluate

## 2017-03-30 ENCOUNTER — Encounter (HOSPITAL_COMMUNITY): Payer: Self-pay | Admitting: *Deleted

## 2017-03-30 ENCOUNTER — Emergency Department (HOSPITAL_COMMUNITY)
Admission: EM | Admit: 2017-03-30 | Discharge: 2017-03-30 | Disposition: A | Discharge status: Home / Self Care | Payer: Self-pay | Attending: Emergency Medicine | Admitting: Emergency Medicine

## 2017-03-30 DIAGNOSIS — Z5321 Procedure and treatment not carried out due to patient leaving prior to being seen by health care provider: Secondary | ICD-10-CM | POA: Insufficient documentation

## 2017-03-30 DIAGNOSIS — M549 Dorsalgia, unspecified: Secondary | ICD-10-CM | POA: Insufficient documentation

## 2017-03-30 HISTORY — DX: Sciatica, unspecified side: M54.30

## 2017-03-30 NOTE — ED Triage Notes (Signed)
To ED for eval of right side leg pain and foot pain for past couple of months. States pain has become worse so was sent to ED for films- possibly an MRI. No injury. Ambulatory without difficulty

## 2017-03-30 NOTE — ED Notes (Addendum)
Pt states she must leave because of transaportation. Pt urged to return. Pt states that she understands she is at risk by leaving up to death as she has not had evaluation. Dc ambulatory with steady gait with husband.

## 2017-04-10 ENCOUNTER — Encounter (HOSPITAL_COMMUNITY): Payer: Self-pay

## 2017-04-10 ENCOUNTER — Emergency Department (HOSPITAL_COMMUNITY): Payer: Self-pay

## 2017-04-10 ENCOUNTER — Emergency Department (HOSPITAL_COMMUNITY)
Admission: EM | Admit: 2017-04-10 | Discharge: 2017-04-10 | Disposition: A | Discharge status: Home / Self Care | Payer: Self-pay | Attending: Emergency Medicine | Admitting: Emergency Medicine

## 2017-04-10 DIAGNOSIS — J069 Acute upper respiratory infection, unspecified: Secondary | ICD-10-CM | POA: Insufficient documentation

## 2017-04-10 DIAGNOSIS — J45909 Unspecified asthma, uncomplicated: Secondary | ICD-10-CM | POA: Insufficient documentation

## 2017-04-10 DIAGNOSIS — N760 Acute vaginitis: Secondary | ICD-10-CM | POA: Insufficient documentation

## 2017-04-10 DIAGNOSIS — B9689 Other specified bacterial agents as the cause of diseases classified elsewhere: Secondary | ICD-10-CM

## 2017-04-10 DIAGNOSIS — Z79899 Other long term (current) drug therapy: Secondary | ICD-10-CM | POA: Insufficient documentation

## 2017-04-10 DIAGNOSIS — B9789 Other viral agents as the cause of diseases classified elsewhere: Secondary | ICD-10-CM

## 2017-04-10 DIAGNOSIS — R05 Cough: Secondary | ICD-10-CM | POA: Insufficient documentation

## 2017-04-10 DIAGNOSIS — L0291 Cutaneous abscess, unspecified: Secondary | ICD-10-CM

## 2017-04-10 DIAGNOSIS — F17210 Nicotine dependence, cigarettes, uncomplicated: Secondary | ICD-10-CM | POA: Insufficient documentation

## 2017-04-10 DIAGNOSIS — N764 Abscess of vulva: Secondary | ICD-10-CM | POA: Insufficient documentation

## 2017-04-10 DIAGNOSIS — B349 Viral infection, unspecified: Secondary | ICD-10-CM | POA: Insufficient documentation

## 2017-04-10 LAB — CBG MONITORING, ED: GLUCOSE-CAPILLARY: 111 mg/dL — AB (ref 65–99)

## 2017-04-10 LAB — URINALYSIS, ROUTINE W REFLEX MICROSCOPIC
Bacteria, UA: NONE SEEN
Bilirubin Urine: NEGATIVE
GLUCOSE, UA: NEGATIVE mg/dL
Ketones, ur: NEGATIVE mg/dL
Leukocytes, UA: NEGATIVE
Nitrite: NEGATIVE
PH: 5 (ref 5.0–8.0)
Protein, ur: NEGATIVE mg/dL
SPECIFIC GRAVITY, URINE: 1.021 (ref 1.005–1.030)

## 2017-04-10 LAB — WET PREP, GENITAL
Sperm: NONE SEEN
Trich, Wet Prep: NONE SEEN
YEAST WET PREP: NONE SEEN

## 2017-04-10 MED ORDER — METRONIDAZOLE 500 MG PO TABS: 500.0000 mg | ORAL_TABLET | Freq: Two times a day (BID) | ORAL | 0 refills | Status: DC

## 2017-04-10 MED ORDER — AEROCHAMBER PLUS FLO-VU SMALL MISC
1.0000 | Freq: Once | Status: AC
  Administered 2017-04-10: 1
  Filled 2017-04-10: qty 1

## 2017-04-10 MED ORDER — SULFAMETHOXAZOLE-TRIMETHOPRIM 800-160 MG PO TABS: 1.0000 | ORAL_TABLET | Freq: Two times a day (BID) | ORAL | 0 refills | Status: DC

## 2017-04-10 MED ORDER — ALBUTEROL SULFATE HFA 108 (90 BASE) MCG/ACT IN AERS
2.0000 | INHALATION_SPRAY | Freq: Once | RESPIRATORY_TRACT | Status: AC
  Administered 2017-04-10: 2 via RESPIRATORY_TRACT
  Filled 2017-04-10: qty 6.7

## 2017-04-10 MED ORDER — LIDOCAINE HCL 2 % IJ SOLN
20.0000 mL | Freq: Once | INTRAMUSCULAR | Status: AC
  Administered 2017-04-10: 400 mg
  Filled 2017-04-10: qty 20

## 2017-04-10 NOTE — ED Triage Notes (Signed)
Pt is here for multiple complaints: lower and mid back pain, nasal and chest congestion which causes her to cough up green phlegm, as well as frequent urination and thirst 'I feel like I cant hydrate myself. My pee smells like nail polish remover."

## 2017-04-10 NOTE — Discharge Instructions (Signed)
Please make sure to take metronidazole and Bactrim twice daily for the next 7 days. Please do not drink any alcohol while your taking metronidazole because it can cause profuse vomiting. Your gonorrhea and chlamydia labs are pending. If positive, someone from the hospital with call you with the results. Please follow-up with Dr. Armen PickupFunches regarding your back pain. Please make sure to keep the area near the abscess clean and dry and covered with a dressing. If you develop fever, chills, or new or worsening symptoms, please return to the emergency department for re-evaluation.

## 2017-04-10 NOTE — ED Notes (Signed)
See EDP secondary assessment.  

## 2017-04-10 NOTE — ED Provider Notes (Signed)
[facility]-EMERGENCY DEPT Provider Note   CSN: [id number] Arrival date & time: 04/10/17  1148  By signing my name below, I, Thelma Barge, attest that this documentation has been prepared under the direction and in the presence of Glorene Leitzke, PA-C. Electronically Signed: Thelma Barge, Scribe. 04/10/17. 1:56 PM.  History   Chief Complaint Chief Complaint  Patient presents with  . Nasal Congestion   The history is provided by the patient. No language interpreter was used.    HPI Comments: Kathy Conway is a 41 y.o. female with PMHx of degenerative disc disorder, fibromyalgia, sciatica, herniating discs, asthma, and bronchitis who presents to the Emergency Department with multiple complaints, complaining of constant back pain for years. She reports her PCP, Dr. Armen Pickup, told her to "come to the ED for a CAT scan or an MRI". Pt has ankle and feet swelling bilaterally but this is baseline for her. She denies any new falls or mechanism of injury. Pt has no h/o cancer or IVDU. Pt has allergies to codeine.   She also complains of constant, gradually worsening nasal and chest congestion with associated fever, chills, and productive cough with green/yellow phlegm for 1 week. Her fever was 100.3 but this has since improved. She has tried Nyquil, tylenol, cough drops, herbal teas, steam showers with mild to no relief. Pt is a smoker. Pt has no albuterol at home.  She also complains of dysuria with associated malodorous (she states it smells of acetone) urine, discomfort in her back after urinating, increased urinary frequency, and vaginal/pelvic pain/discomfort for 1 week. She also reports dyspareunia? with appearance of new boils on her labia. Pt has no new sexual partners. She denies hematuria, color change in her urine, and vaginal discharge. Pt had a hysterectomy 6 years ago.   She further complains of constant diarrhea for 3-4 days. She has associated nausea after she eats and abdominal pain. She reports  her husband had similar symptoms. Pt had gestational diabetes during her pregnancy 20 years ago. She denies blood in stool and vomiting.  Past Medical History:  Diagnosis Date  . Asthma 10/1993  . Fibromyalgia   . H/O degenerative disc disease   . IBS (irritable bowel syndrome) 10/2003  . Migraine 10/1996  . Renal disorder   . Sciatica   . Vertigo 10/2009    Patient Active Problem List   Diagnosis Date Noted  . Tachycardia 03/24/2017  . Bilateral hearing loss 03/24/2017  . Acute pain of right foot 03/24/2017  . Insomnia 03/05/2017  . Plantar fasciitis, left 10/23/2016  . Chronic bilateral low back pain with bilateral sciatica 09/08/2016  . IBS (irritable bowel syndrome) 05/23/2016  . Asthma   . Paresthesia of both hands 03/10/2016  . Boils 03/10/2016  . Anxiety state 03/10/2016  . H/O degenerative disc disease   . Fibromyalgia   . Vertigo 10/09/2009  . Migraine 10/09/1996    Past Surgical History:  Procedure Laterality Date  . ABDOMINAL HYSTERECTOMY    . CHOLECYSTECTOMY    . TUBAL LIGATION      OB History    Gravida Para Term Preterm AB Living   2 2       2    SAB TAB Ectopic Multiple Live Births                   Home Medications    Prior to Admission medications   Medication Sig Start Date End Date Taking? Authorizing Provider  Acetaminophen-Caffeine (EXCEDRIN TENSION HEADACHE) 500-65 MG TABS Take  650 mg by mouth as needed.    [provider]  cholecalciferol (VITAMIN D) 1000 units tablet Take 2,000 Units by mouth daily.    [provider]  cyclobenzaprine (FLEXERIL) 10 MG tablet Take 1 tablet (10 mg total) by mouth at bedtime. 03/01/17   Funches, Gerilyn NestleJosalyn, MD  diclofenac (VOLTAREN) 75 MG EC tablet Take 1 tablet (75 mg total) by mouth 2 (two) times daily. 03/22/17   Funches, Gerilyn NestleJosalyn, MD  diphenoxylate-atropine (LOMOTIL) 2.5-0.025 MG tablet Take 1 tablet by mouth 4 (four) times daily as needed for diarrhea or loose stools. 10/23/16   Funches,  Gerilyn NestleJosalyn, MD  gabapentin (NEURONTIN) 300 MG capsule Take 1 capsule (300 mg total) by mouth at bedtime. 03/01/17   Funches, Gerilyn NestleJosalyn, MD  ketoconazole (NIZORAL) 2 % cream Apply 1 application topically daily. 10/23/16   Funches, Gerilyn NestleJosalyn, MD  meclizine (ANTIVERT) 25 MG tablet Take 1 tablet (25 mg total) by mouth 3 (three) times daily as needed. 03/22/17   Funches, Gerilyn NestleJosalyn, MD  MELATONIN PO Take 2 tablets by mouth at bedtime as needed (sleep). gummys     [provider]  metroNIDAZOLE (FLAGYL) 500 MG tablet Take 1 tablet (500 mg total) by mouth 2 (two) times daily. 04/10/17   Londell Noll A, PA-C  sulfamethoxazole-trimethoprim (BACTRIM DS,SEPTRA DS) 800-160 MG tablet Take 1 tablet by mouth 2 (two) times daily. 04/10/17 04/17/17  Montavis Schubring A, PA-C  traZODone (DESYREL) 50 MG tablet Take 0.5-1 tablets (25-50 mg total) by mouth at bedtime as needed for sleep. 03/01/17   Funches, Gerilyn NestleJosalyn, MD  vitamin B-12 (CYANOCOBALAMIN) 1000 MCG tablet Take 2,000 mcg by mouth daily. Gummies    [provider]    Family History Family History  Problem Relation Age of Onset  . Cancer Mother   . Cancer Father   . Diabetes Maternal Grandfather     Social History Social History  Substance Use Topics  . Smoking status: Current Every Day Smoker    Packs/day: 0.50    Types: Cigarettes  . Smokeless tobacco: Never Used  . Alcohol use No     Allergies   Codeine; Vicodin [hydrocodone-acetaminophen]; Iodine; and Latex   Review of Systems Review of Systems  Constitutional: Positive for chills and fever.  HENT: Positive for congestion.   Respiratory: Positive for cough.   Cardiovascular: Positive for leg swelling.  Gastrointestinal: Positive for abdominal pain, diarrhea and nausea. Negative for blood in stool and vomiting.  Genitourinary: Positive for dyspareunia, dysuria, frequency, pelvic pain and vaginal pain. Negative for hematuria and vaginal discharge.  Musculoskeletal: Positive for back pain.     Physical Exam Updated Vital Signs BP 117/75   Pulse 91   Temp 98.5 F (36.9 C) (Oral)   Resp 16   SpO2 97%   Physical Exam  Constitutional: No distress.  HENT:  Head: Normocephalic.  Eyes: Conjunctivae are normal.  Neck: Neck supple.  Cardiovascular: Normal rate and regular rhythm.  Exam reveals no gallop and no friction rub.   No murmur heard. Pulmonary/Chest: Effort normal and breath sounds normal. No respiratory distress. She has no wheezes. She has no rales.  Abdominal: Soft. Bowel sounds are normal. She exhibits no distension. There is tenderness. There is no guarding.  Bilateral lower quadrant tenderness.   Genitourinary:    There is no tenderness on the right labia. There is no tenderness on the left labia. No tenderness or bleeding in the vagina. No foreign body in the vagina.  Genitourinary Comments: Chaperoned exam. Scant white  discharge noted in the vaginal vault. No cervical motion tenderness. Uterus absent. No tenderness to the bilateral adnexa. There is a 1 cm small abscess located on the left side of the vulvovaginal area at approximately 1 to 2:00 and a second small 1 cm abscess located between 10 and 11:00 with mild fluctuance and mild induration. No active drainage.  Musculoskeletal: Normal range of motion. She exhibits no edema, tenderness or deformity.  Neurological: She is alert.  Skin: Skin is warm. No rash noted.  Psychiatric: Her behavior is normal.  Nursing note and vitals reviewed.    ED Treatments / Results  DIAGNOSTIC STUDIES: Oxygen Saturation is 97% on RA, normal by my interpretation.    COORDINATION OF CARE: 1:56 PM Discussed treatment plan with pt at bedside and pt agreed to plan.  Labs (all labs ordered are listed, but only abnormal results are displayed) Labs Reviewed  WET PREP, GENITAL - Abnormal; Notable for the following:       Result Value   Clue Cells Wet Prep HPF POC PRESENT (*)    WBC, Wet Prep HPF POC FEW (*)    All other  components within normal limits  URINALYSIS, ROUTINE W REFLEX MICROSCOPIC - Abnormal; Notable for the following:    Hgb urine dipstick SMALL (*)    Squamous Epithelial / LPF 0-5 (*)    All other components within normal limits  CBG MONITORING, ED - Abnormal; Notable for the following:    Glucose-Capillary 111 (*)    All other components within normal limits  GC/CHLAMYDIA PROBE AMP ([place]) NOT AT [facility]    EKG  EKG Interpretation None       Radiology Dg Chest 2 View  Result Date: 04/10/2017 CLINICAL DATA:  Chest pain shortness of breath. EXAM: CHEST  2 VIEW COMPARISON:  09/17/2008 FINDINGS: The lungs are clear without focal pneumonia, edema, pneumothorax or pleural effusion. The cardiopericardial silhouette is within normal limits for size. The visualized bony structures of the thorax are intact. IMPRESSION: No acute cardiopulmonary findings. Electronically Signed   By: Kennith Center M.D.   On: 04/10/2017 12:19    Procedures .Marland KitchenIncision and Drainage Date/Time: 04/10/2017 6:20 PM Performed by: Lilian Kapur, Lourdes Kucharski A Authorized by: Frederik Pear A   Consent:    Consent obtained:  Verbal   Consent given by:  Patient   Risks discussed:  Bleeding, incomplete drainage and pain   Alternatives discussed:  No treatment, delayed treatment and observation Location:    Type:  Abscess   Location: vulvovaginal. Pre-procedure details:    Skin preparation:  Antiseptic wash Anesthesia (see MAR for exact dosages):    Anesthesia method:  Local infiltration   Local anesthetic:  Lidocaine 2% w/o epi Procedure type:    Complexity:  Simple Procedure details:    Incision types:  Single straight   Incision depth:  Dermal   Scalpel blade:  10   Drainage:  Bloody and purulent   Drainage amount:  Scant   Wound treatment:  Wound left open   Packing materials:  None Post-procedure details:    Patient tolerance of procedure:  Tolerated well, no immediate complications   (including critical care  time)  Medications Ordered in ED Medications  albuterol (PROVENTIL HFA;VENTOLIN HFA) 108 (90 Base) MCG/ACT inhaler 2 puff (2 puffs Inhalation Given 04/10/17 1639)  AEROCHAMBER PLUS FLO-VU SMALL device MISC 1 each (1 each Other Given 04/10/17 1639)  lidocaine (XYLOCAINE) 2 % (with pres) injection 400 mg (400 mg Infiltration Given by Other 04/10/17  1526)     Initial Impression / Assessment and Plan / ED Course  I have reviewed the triage vital signs and the nursing notes.  Pertinent labs & imaging results that were available during my care of the patient were reviewed by me and considered in my medical decision making (see chart for details).     Patient presenting to the emergency department with multiple complaints. She reports 1 week of nasal and chest congestion. Lungs are clear to auscultation bilaterally. Chest x-ray negative for acute cardiopulmonary processes. Patient has a history of asthma. Will provide the patient with an albuterol inhaler and a spacer. Patient also presents with increased urinary frequency and bilateral pelvic pain. UA not concerning for infection. Patient also performed with dyspareunia and intermittent vaginal complaints. Wet prep positive for bacterial vaginosis. GC chlamydia pending at this time. Given the patient's sexual history with a monogamous female partner for the last 3 years, will treat bacterial vaginosis with a prescription of metronidazole and forego gonorrhea and chlamydia treatment at this time. Educated the patient that if either of the 2 pending labs are positive, she will receive a call at the contact number she provided registration. The patient also presents with 2 small abscesses, one on the left vulvovaginal area and one on the right that are amenable to I&D. I&D was performed to both locations, which the patient tolerated well. A small amount of purulent and bloody drainage was expressed from each wound. Given the 2 abscesses, plus several more on the  patient's abdomen and urogenital region that are not amenable to I&D at this time, will cover the patient with Bactrim. The patient also reports that she was supposed to be scheduled for a CT or MRI by her primary care provider for chronic low back pain. Encourage the patient to follow-up with her PCP for further workup of this complaint at this time given that the patient does not have any red flags. Vital signs stable. No acute distress. The patient is stable for discharge at this time.  Final Clinical Impressions(s) / ED Diagnoses   Final diagnoses:  Abscess  Bacterial vaginosis  Viral URI with cough    New Prescriptions Discharge Medication List as of 04/10/2017  4:02 PM    START taking these medications   Details  metroNIDAZOLE (FLAGYL) 500 MG tablet Take 1 tablet (500 mg total) by mouth 2 (two) times daily., Starting Tue 04/10/2017, Print    sulfamethoxazole-trimethoprim (BACTRIM DS,SEPTRA DS) 800-160 MG tablet Take 1 tablet by mouth 2 (two) times daily., Starting Tue 04/10/2017, Until Tue 04/17/2017, Print      I personally performed the services described in this documentation, which was scribed in my presence. The recorded information has been reviewed and is accurate.    Frederik Pear A, PA-C 04/10/17 [place], MD 04/11/17 [phone]

## 2017-04-12 LAB — GC/CHLAMYDIA PROBE AMP (~~LOC~~) NOT AT ARMC
Chlamydia: NEGATIVE
Neisseria Gonorrhea: NEGATIVE

## 2017-04-17 ENCOUNTER — Encounter: Payer: Self-pay | Admitting: Family Medicine

## 2017-04-17 ENCOUNTER — Ambulatory Visit: Payer: Self-pay | Attending: Family Medicine | Admitting: Family Medicine

## 2017-04-17 VITALS — BP 115/79 | HR 81 | Temp 98.3°F | Ht 66.0 in | Wt 257.4 lb

## 2017-04-17 DIAGNOSIS — F419 Anxiety disorder, unspecified: Secondary | ICD-10-CM | POA: Insufficient documentation

## 2017-04-17 DIAGNOSIS — F39 Unspecified mood [affective] disorder: Secondary | ICD-10-CM | POA: Insufficient documentation

## 2017-04-17 DIAGNOSIS — M722 Plantar fascial fibromatosis: Secondary | ICD-10-CM | POA: Insufficient documentation

## 2017-04-17 DIAGNOSIS — J45909 Unspecified asthma, uncomplicated: Secondary | ICD-10-CM | POA: Insufficient documentation

## 2017-04-17 DIAGNOSIS — G8929 Other chronic pain: Secondary | ICD-10-CM | POA: Insufficient documentation

## 2017-04-17 DIAGNOSIS — R05 Cough: Secondary | ICD-10-CM | POA: Insufficient documentation

## 2017-04-17 DIAGNOSIS — F17210 Nicotine dependence, cigarettes, uncomplicated: Secondary | ICD-10-CM | POA: Insufficient documentation

## 2017-04-17 DIAGNOSIS — Z79899 Other long term (current) drug therapy: Secondary | ICD-10-CM | POA: Insufficient documentation

## 2017-04-17 DIAGNOSIS — N764 Abscess of vulva: Secondary | ICD-10-CM

## 2017-04-17 DIAGNOSIS — E669 Obesity, unspecified: Secondary | ICD-10-CM | POA: Insufficient documentation

## 2017-04-17 DIAGNOSIS — M79671 Pain in right foot: Secondary | ICD-10-CM

## 2017-04-17 DIAGNOSIS — Z8632 Personal history of gestational diabetes: Secondary | ICD-10-CM

## 2017-04-17 DIAGNOSIS — M549 Dorsalgia, unspecified: Secondary | ICD-10-CM | POA: Insufficient documentation

## 2017-04-17 DIAGNOSIS — M79672 Pain in left foot: Secondary | ICD-10-CM | POA: Insufficient documentation

## 2017-04-17 DIAGNOSIS — R0989 Other specified symptoms and signs involving the circulatory and respiratory systems: Secondary | ICD-10-CM | POA: Insufficient documentation

## 2017-04-17 DIAGNOSIS — Z6841 Body Mass Index (BMI) 40.0 and over, adult: Secondary | ICD-10-CM | POA: Insufficient documentation

## 2017-04-17 LAB — GLUCOSE, POCT (MANUAL RESULT ENTRY): POC GLUCOSE: 99 mg/dL (ref 70–99)

## 2017-04-17 LAB — POCT GLYCOSYLATED HEMOGLOBIN (HGB A1C): HEMOGLOBIN A1C: 5.3

## 2017-04-17 MED ORDER — METRONIDAZOLE 500 MG PO TABS: 500.0000 mg | ORAL_TABLET | Freq: Two times a day (BID) | ORAL | 0 refills | Status: DC

## 2017-04-17 MED ORDER — SULFAMETHOXAZOLE-TRIMETHOPRIM 800-160 MG PO TABS: 1.0000 | ORAL_TABLET | Freq: Two times a day (BID) | ORAL | 0 refills | Status: AC

## 2017-04-17 MED FILL — ?DICLOFENAC SOD DR 75 MG TA: 75 | 15 days supply | Qty: 30 | Fill #0

## 2017-04-17 MED FILL — TRAVEL SICKNESS 25 MG TAB C: 25 | 20 days supply | Qty: 60 | Fill #0

## 2017-04-17 MED FILL — metroNIDAZOLE 500 MG TABS: 500 | 7 days supply | Qty: 14 | Fill #0

## 2017-04-17 MED FILL — SULFAMETHOXAZOLE/TMP DS TAB: 800-160 | 7 days supply | Qty: 14 | Fill #0

## 2017-04-17 NOTE — Assessment & Plan Note (Signed)
Bilateral foot pain in obese patient She is able to bear waits There is mild selling. No deformity.  Suspect plantar fascitis on the L and metatarsalgia on the R Diclofenac ordered Weight loss recommended

## 2017-04-17 NOTE — Patient Instructions (Addendum)
Kathy Conway was seen today for foot pain.  Diagnoses and all orders for this visit:  Vulvar abscess -     metroNIDAZOLE (FLAGYL) 500 MG tablet; Take 1 tablet (500 mg total) by mouth 2 (two) times daily. -     sulfamethoxazole-trimethoprim (BACTRIM DS,SEPTRA DS) 800-160 MG tablet; Take 1 tablet by mouth 2 (two) times daily.  History of gestational diabetes -     POCT glycosylated hemoglobin (Hb A1C) -     Glucose (CBG)  Foot pain, bilateral  take diclofenac with food for foot pain Continue good work of weight loss Low sugar diet  Bactrim will help with vulvar abscess and with cough that is likely bronchitis   You do not have diabetes   F/u in 6 weeks for foot pain   Dr. Armen PickupFunches

## 2017-04-17 NOTE — Progress Notes (Signed)
Subjective:  Patient ID: Kathy Conway, female    DOB: [birth year]  Age: 41 y.o. MRN: [id number]  CC: No chief complaint on file.   HPI Kathy Conway  Has, obesity,  chronic back pain, asthma, anxiety,vertigo, mood disorder  she presents with her boyfriend for   1. plantar fasciitis L foot: she was seen in the ED on 02/13/2017. She also complained of chronic Left heel pain. She had recent x-ray of L ankle in 10/18/2016 that revealed small Achilles and plantar calcaneal spurs.  She is uninsured and did not see the podiatrist. She has not applied for the orange card or [employer] discount to gain access. She is obese.   She report course of voltaren gel has helped her L heel pain. She now reports R dorsal lateral from MTP joint to mid foot pain. She describes pain as burning. No injury. No bruising or swelling.  She has not taking prescribed diclofenac.  2. Vulvar abscess: [facility] for vulvar abscess. She had ID done. She did not take prescribed bactrim or metronidazole due to cost. She reports area drained and redness improved. She has 2 areas on mons that are red and slightly tender.   3. History of gestational diabetes: she request diabetes screening. She has stopped drinking soda. She is working to lose weight.   4. Cough: x 2 weeks with chest congestion. No fever, chills, CP or SOB. Albuterol helps but still has congestion. Cough is productive of yellow sputum.    Social History  Substance Use Topics  . Smoking status: Current Every Day Smoker    Packs/day: 0.50    Types: Cigarettes  . Smokeless tobacco: Never Used  . Alcohol use No    Outpatient Medications Prior to Visit  Medication Sig Dispense Refill  . Acetaminophen-Caffeine (EXCEDRIN TENSION HEADACHE) 500-65 MG TABS Take 650 mg by mouth as needed.    . cholecalciferol (VITAMIN D) 1000 units tablet Take 2,000 Units by mouth daily.    . cyclobenzaprine (FLEXERIL) 10 MG tablet Take 1 tablet (10  mg total) by mouth at bedtime. 30 tablet 2  . diclofenac (VOLTAREN) 75 MG EC tablet Take 1 tablet (75 mg total) by mouth 2 (two) times daily. 30 tablet 0  . diphenoxylate-atropine (LOMOTIL) 2.5-0.025 MG tablet Take 1 tablet by mouth 4 (four) times daily as needed for diarrhea or loose stools. 60 tablet 2  . gabapentin (NEURONTIN) 300 MG capsule Take 1 capsule (300 mg total) by mouth at bedtime. 30 capsule 3  . ketoconazole (NIZORAL) 2 % cream Apply 1 application topically daily. 15 g 0  . meclizine (ANTIVERT) 25 MG tablet Take 1 tablet (25 mg total) by mouth 3 (three) times daily as needed. 60 tablet 2  . MELATONIN PO Take 2 tablets by mouth at bedtime as needed (sleep). gummys     . metroNIDAZOLE (FLAGYL) 500 MG tablet Take 1 tablet (500 mg total) by mouth 2 (two) times daily. 14 tablet 0  . sulfamethoxazole-trimethoprim (BACTRIM DS,SEPTRA DS) 800-160 MG tablet Take 1 tablet by mouth 2 (two) times daily. 14 tablet 0  . traZODone (DESYREL) 50 MG tablet Take 0.5-1 tablets (25-50 mg total) by mouth at bedtime as needed for sleep. 30 tablet 2  . vitamin B-12 (CYANOCOBALAMIN) 1000 MCG tablet Take 2,000 mcg by mouth daily. Gummies     No facility-administered medications prior to visit.     ROS Review of Systems  Constitutional: Negative for  chills and fever.  HENT: Positive for hearing loss. Negative for ear pain.   Eyes: Negative for visual disturbance.  Respiratory: Negative for shortness of breath.   Cardiovascular: Negative for chest pain.  Gastrointestinal: Negative for abdominal distention, abdominal pain, anal bleeding, blood in stool, constipation, diarrhea, nausea, rectal pain and vomiting.  Musculoskeletal: Positive for arthralgias, back pain and myalgias. Negative for joint swelling.  Skin: Negative for color change and rash.  Allergic/Immunologic: Negative for immunocompromised state.  Neurological: Positive for dizziness. Negative for tremors, seizures, syncope, facial asymmetry,  speech difficulty, weakness, light-headedness, numbness and headaches.  Hematological: Negative for adenopathy. Does not bruise/bleed easily.  Psychiatric/Behavioral: Positive for dysphoric mood and sleep disturbance. Negative for suicidal ideas. The patient is nervous/anxious.     Objective:  BP 115/79   Pulse 81   Temp 98.3 F (36.8 C) (Oral)   Ht 5\' 6"  (1.676 m)   Wt 257 lb 6.4 oz (116.8 kg)   SpO2 97%   BMI 41.55 kg/m   BP/Weight 04/10/2017 03/30/2017 03/22/2017  Systolic BP 117 115 121  Diastolic BP 75 79 85  Wt. (Lbs) - 258 259.4  BMI - 42.93 41.87   Pulse Readings from Last 3 Encounters:  04/17/17 81  04/10/17 91  03/30/17 86   Wt Readings from Last 3 Encounters:  04/17/17 257 lb 6.4 oz (116.8 kg)  03/30/17 258 lb (117 kg)  03/22/17 259 lb 6.4 oz (117.7 kg)    Physical Exam  Constitutional: She is oriented to person, place, and time. She appears well-developed and well-nourished. No distress.  HENT:  Head: Normocephalic and atraumatic.  Right Ear: Tympanic membrane, external ear and ear canal normal. Decreased hearing is noted.  Left Ear: Tympanic membrane, external ear and ear canal normal. Decreased hearing is noted.  Cardiovascular: Normal rate, regular rhythm, normal heart sounds and intact distal pulses.   Pulmonary/Chest: Effort normal and breath sounds normal.  Genitourinary:     Musculoskeletal: She exhibits no edema.       Lumbar back: She exhibits tenderness (b/l lumbar spine ).       Left foot: There is tenderness and swelling. There is normal range of motion, no bony tenderness, normal capillary refill, no crepitus, no deformity and no laceration.       Feet:  Neurological: She is alert and oriented to person, place, and time.  Skin: Skin is warm and dry. No rash noted.  Psychiatric: She has a normal mood and affect.    Lab Results  Component Value Date   HGBA1C 5.3 04/17/2017    Assessment & Plan:  Tiane was seen today for foot  pain.  Diagnoses and all orders for this visit:  Vulvar abscess -     metroNIDAZOLE (FLAGYL) 500 MG tablet; Take 1 tablet (500 mg total) by mouth 2 (two) times daily. -     sulfamethoxazole-trimethoprim (BACTRIM DS,SEPTRA DS) 800-160 MG tablet; Take 1 tablet by mouth 2 (two) times daily.  History of gestational diabetes -     POCT glycosylated hemoglobin (Hb A1C) -     Glucose (CBG)  Foot pain, bilateral    No orders of the defined types were placed in this encounter.   Follow-up: Return in about 6 weeks (around 05/29/2017) for foot pain.   Dessa Phi MD

## 2017-04-17 NOTE — Assessment & Plan Note (Signed)
Normal A1c at home

## 2017-04-18 ENCOUNTER — Ambulatory Visit: Payer: Self-pay | Attending: Family Medicine

## 2017-06-05 ENCOUNTER — Ambulatory Visit: Payer: Self-pay | Attending: Internal Medicine | Admitting: Internal Medicine

## 2017-06-05 ENCOUNTER — Encounter: Payer: Self-pay | Admitting: Internal Medicine

## 2017-06-05 VITALS — BP 130/86 | HR 95 | Temp 98.2°F | Resp 18 | Ht 65.0 in | Wt 262.0 lb

## 2017-06-05 DIAGNOSIS — M5441 Lumbago with sciatica, right side: Secondary | ICD-10-CM | POA: Insufficient documentation

## 2017-06-05 DIAGNOSIS — R42 Dizziness and giddiness: Secondary | ICD-10-CM | POA: Insufficient documentation

## 2017-06-05 DIAGNOSIS — M722 Plantar fascial fibromatosis: Secondary | ICD-10-CM | POA: Insufficient documentation

## 2017-06-05 DIAGNOSIS — M79672 Pain in left foot: Secondary | ICD-10-CM | POA: Insufficient documentation

## 2017-06-05 DIAGNOSIS — F319 Bipolar disorder, unspecified: Secondary | ICD-10-CM | POA: Insufficient documentation

## 2017-06-05 DIAGNOSIS — F39 Unspecified mood [affective] disorder: Secondary | ICD-10-CM | POA: Insufficient documentation

## 2017-06-05 DIAGNOSIS — E669 Obesity, unspecified: Secondary | ICD-10-CM | POA: Insufficient documentation

## 2017-06-05 DIAGNOSIS — M79671 Pain in right foot: Secondary | ICD-10-CM | POA: Insufficient documentation

## 2017-06-05 DIAGNOSIS — G4700 Insomnia, unspecified: Secondary | ICD-10-CM | POA: Insufficient documentation

## 2017-06-05 DIAGNOSIS — J45909 Unspecified asthma, uncomplicated: Secondary | ICD-10-CM | POA: Insufficient documentation

## 2017-06-05 DIAGNOSIS — K589 Irritable bowel syndrome without diarrhea: Secondary | ICD-10-CM | POA: Insufficient documentation

## 2017-06-05 DIAGNOSIS — F411 Generalized anxiety disorder: Secondary | ICD-10-CM | POA: Insufficient documentation

## 2017-06-05 DIAGNOSIS — M5416 Radiculopathy, lumbar region: Secondary | ICD-10-CM | POA: Insufficient documentation

## 2017-06-05 DIAGNOSIS — M5442 Lumbago with sciatica, left side: Secondary | ICD-10-CM | POA: Insufficient documentation

## 2017-06-05 DIAGNOSIS — Z6841 Body Mass Index (BMI) 40.0 and over, adult: Secondary | ICD-10-CM | POA: Insufficient documentation

## 2017-06-05 DIAGNOSIS — F17210 Nicotine dependence, cigarettes, uncomplicated: Secondary | ICD-10-CM | POA: Insufficient documentation

## 2017-06-05 DIAGNOSIS — G8929 Other chronic pain: Secondary | ICD-10-CM | POA: Insufficient documentation

## 2017-06-05 DIAGNOSIS — Z720 Tobacco use: Secondary | ICD-10-CM

## 2017-06-05 DIAGNOSIS — M797 Fibromyalgia: Secondary | ICD-10-CM | POA: Insufficient documentation

## 2017-06-05 DIAGNOSIS — Z885 Allergy status to narcotic agent status: Secondary | ICD-10-CM | POA: Insufficient documentation

## 2017-06-05 MED ORDER — TRAZODONE HCL 100 MG PO TABS: 100.0000 mg | ORAL_TABLET | Freq: Every evening | ORAL | 1 refills | Status: DC | PRN

## 2017-06-05 MED ORDER — GABAPENTIN 300 MG PO CAPS: 300.0000 mg | ORAL_CAPSULE | Freq: Two times a day (BID) | ORAL | 3 refills | Status: DC

## 2017-06-05 MED FILL — ?CYCLOBENZAPRINE 10 MG TABL: 10 | 30 days supply | Qty: 30 | Fill #1

## 2017-06-05 MED FILL — traZODone HCL 100 MG TABS: 100 | 30 days supply | Qty: 30 | Fill #0

## 2017-06-05 MED FILL — GABAPENTIN 300 MG CAPSULE: 300 | 30 days supply | Qty: 60 | Fill #0

## 2017-06-05 NOTE — Patient Instructions (Addendum)
Call [phone] now for the nicotine patches.  Increase Trazodone to 100 mg daily.  Increase Gabapentin to 300 mg twice a day.  Follow a Healthy Eating Plan - You can do it! Limit sugary drinks.  Avoid sodas, sweet tea, sport or energy drinks, or fruit drinks.  Drink water, lo-fat milk, or diet drinks. Limit snack foods.   Cut back on candy, cake, cookies, chips, ice cream.  These are a special treat, only in small amounts. Eat plenty of vegetables.  Especially dark green, red, and orange vegetables. Aim for at least 3 servings a day. More is better! Include fruit in your daily diet.  Whole fruit is much healthier than fruit juice! Limit "white" bread, "white" pasta, "white" rice.   Choose "100% whole grain" products, brown or wild rice. Avoid fatty meats. Try "Meatless Monday" and choose eggs or beans one day a week.  When eating meat, choose lean meats like chicken, [place], and fish.  Grill, broil, or bake meats instead of frying, and eat poultry without the skin. Eat less salt.  Avoid frozen pizzas, frozen dinners and salty foods.  Use seasonings other than salt in cooking.  This can help blood pressure and keep you from swelling Beer, wine and liquor have calories.  If you can safely drink alcohol, limit to 1 drink per day for women, 2 drinks for men
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
## 2017-06-15 ENCOUNTER — Encounter (HOSPITAL_COMMUNITY): Payer: Self-pay | Admitting: *Deleted

## 2017-06-15 ENCOUNTER — Emergency Department (HOSPITAL_COMMUNITY): Payer: Self-pay

## 2017-06-15 ENCOUNTER — Emergency Department (HOSPITAL_COMMUNITY)
Admission: EM | Admit: 2017-06-15 | Discharge: 2017-06-15 | Disposition: A | Discharge status: Home / Self Care | Payer: Self-pay | Attending: Emergency Medicine | Admitting: Emergency Medicine

## 2017-06-15 DIAGNOSIS — Z91040 Latex allergy status: Secondary | ICD-10-CM | POA: Insufficient documentation

## 2017-06-15 DIAGNOSIS — Z9049 Acquired absence of other specified parts of digestive tract: Secondary | ICD-10-CM | POA: Insufficient documentation

## 2017-06-15 DIAGNOSIS — G8929 Other chronic pain: Secondary | ICD-10-CM

## 2017-06-15 DIAGNOSIS — J45909 Unspecified asthma, uncomplicated: Secondary | ICD-10-CM | POA: Insufficient documentation

## 2017-06-15 DIAGNOSIS — F419 Anxiety disorder, unspecified: Secondary | ICD-10-CM | POA: Insufficient documentation

## 2017-06-15 DIAGNOSIS — Z79899 Other long term (current) drug therapy: Secondary | ICD-10-CM | POA: Insufficient documentation

## 2017-06-15 DIAGNOSIS — M5441 Lumbago with sciatica, right side: Secondary | ICD-10-CM | POA: Insufficient documentation

## 2017-06-15 DIAGNOSIS — M5442 Lumbago with sciatica, left side: Secondary | ICD-10-CM | POA: Insufficient documentation

## 2017-06-15 DIAGNOSIS — F17210 Nicotine dependence, cigarettes, uncomplicated: Secondary | ICD-10-CM | POA: Insufficient documentation

## 2017-06-15 MED ORDER — METHYLPREDNISOLONE 4 MG PO TBPK: ORAL_TABLET | ORAL | 0 refills | Status: DC

## 2017-06-15 NOTE — ED Provider Notes (Signed)
[facility]-EMERGENCY DEPT Provider Note   CSN: [id number] Arrival date & time: 06/15/17  1653     History   Chief Complaint Chief Complaint  Patient presents with  . Back Pain    HPI Kathy Conway is a 41 y.o. female.  HPI  Patient, with past medical history of fibromyalgia, sciatica, degenerative disc disease, presents to ED for 3 day history of lower back pain radiating down the right leg; right foot pain for the past month. States a sharp pain that goes down her right leg worse with movement. She has a history of chronic back pain and she is unable to follow up with the provided since moving to this area. States that this has gradually gotten worse. She has tried Flexeril, Tylenol and anti-inflammatories with no relief in her symptoms. She is unsure if this is due to movement. She denies any numbness, weakness, prior back surgery, history of cancer, history of IV drug use, urinary symptoms, injury or falls. Regarding her right foot pain, she states that it's been going on for the past month. Describes it as a burning sensation and worse with weightbearing. She states that it is intermittent. She is unsure if she broke something in her foot a month ago.  Past Medical History:  Diagnosis Date  . Asthma 10/1993  . Fibromyalgia   . H/O degenerative disc disease   . IBS (irritable bowel syndrome) 10/2003  . Migraine 10/1996  . Renal disorder   . Sciatica   . Vertigo 10/2009    Patient Active Problem List   Diagnosis Date Noted  . Foot pain, bilateral 04/17/2017  . History of gestational diabetes 04/17/2017  . Bilateral hearing loss 03/24/2017  . Acute pain of right foot 03/24/2017  . Insomnia 03/05/2017  . Plantar fasciitis, left 10/23/2016  . Chronic bilateral low back pain with bilateral sciatica 09/08/2016  . IBS (irritable bowel syndrome) 05/23/2016  . Asthma   . Paresthesia of both hands 03/10/2016  . Boils 03/10/2016  . Anxiety state 03/10/2016  . H/O degenerative disc  disease   . Fibromyalgia   . Vertigo 10/09/2009  . Migraine 10/09/1996    Past Surgical History:  Procedure Laterality Date  . ABDOMINAL HYSTERECTOMY    . CHOLECYSTECTOMY    . TUBAL LIGATION      OB History    Gravida Para Term Preterm AB Living   2 2       2    SAB TAB Ectopic Multiple Live Births                   Home Medications    Prior to Admission medications   Medication Sig Start Date End Date Taking? Authorizing Provider  Acetaminophen-Caffeine (EXCEDRIN TENSION HEADACHE) 500-65 MG TABS Take 650 mg by mouth as needed.    [provider]  cholecalciferol (VITAMIN D) 1000 units tablet Take 2,000 Units by mouth daily.    [provider]  cyclobenzaprine (FLEXERIL) 10 MG tablet Take 1 tablet (10 mg total) by mouth at bedtime. 03/01/17   Funches, Gerilyn NestleJosalyn, MD  diclofenac (VOLTAREN) 75 MG EC tablet Take 1 tablet (75 mg total) by mouth 2 (two) times daily. 03/22/17   Funches, Gerilyn NestleJosalyn, MD  diphenoxylate-atropine (LOMOTIL) 2.5-0.025 MG tablet Take 1 tablet by mouth 4 (four) times daily as needed for diarrhea or loose stools. Patient not taking: Reported on 04/17/2017 10/23/16   Dessa PhiFunches, Josalyn, MD  gabapentin (NEURONTIN) 300 MG capsule Take 1 capsule (300 mg total) by mouth  2 (two) times daily. 06/05/17   Marcine Matar, MD  ketoconazole (NIZORAL) 2 % cream Apply 1 application topically daily. 10/23/16   Funches, Gerilyn Nestle, MD  meclizine (ANTIVERT) 25 MG tablet Take 1 tablet (25 mg total) by mouth 3 (three) times daily as needed. Patient not taking: Reported on 06/05/2017 03/22/17   Dessa Phi, MD  MELATONIN PO Take 2 tablets by mouth at bedtime as needed (sleep). gummys     [provider]  methylPREDNISolone (MEDROL DOSEPAK) 4 MG TBPK tablet Taper over 6 days. 06/15/17   Bettejane Leavens, PA-C  traZODone (DESYREL) 100 MG tablet Take 1 tablet (100 mg total) by mouth at bedtime as needed for sleep. 06/05/17   Marcine Matar, MD  vitamin B-12  (CYANOCOBALAMIN) 1000 MCG tablet Take 2,000 mcg by mouth daily. Gummies    [provider]    Family History Family History  Problem Relation Age of Onset  . Cancer Mother   . Cancer Father   . Diabetes Maternal Grandfather     Social History Social History  Substance Use Topics  . Smoking status: Current Every Day Smoker    Packs/day: 0.50    Types: Cigarettes  . Smokeless tobacco: Never Used  . Alcohol use No     Allergies   Codeine; Vicodin [hydrocodone-acetaminophen]; Iodine; and Latex   Review of Systems Review of Systems  Constitutional: Negative for chills and fever.  Cardiovascular: Negative for chest pain and leg swelling.  Gastrointestinal: Negative for nausea and vomiting.  Musculoskeletal: Positive for back pain. Negative for arthralgias, gait problem, joint swelling and myalgias.  Skin: Negative for rash and wound.  Neurological: Negative for weakness and numbness.     Physical Exam Updated Vital Signs BP (!) 156/108   Pulse (!) 113   Temp 99.1 F (37.3 C) (Oral)   Resp 17   SpO2 97%   Physical Exam  Constitutional: She appears well-developed and well-nourished. No distress.  She does appear anxious. Nontoxic appearing.  HENT:  Head: Normocephalic and atraumatic.  Eyes: Conjunctivae and EOM are normal. No scleral icterus.  Neck: Normal range of motion.  Pulmonary/Chest: Effort normal. No respiratory distress.  Musculoskeletal: Normal range of motion. She exhibits tenderness. She exhibits no edema or deformity.       Arms:      Feet:  Tenderness to palpation at the indicated areas on the midline of the lumbar spine and lateral aspect of the right foot. No color change, deformity or bruising noted at the right foot. No midline spinal tenderness present in thoracic or cervical spine. No step-off palpated. No visible bruising, edema or temperature change noted. No objective signs of numbness present. No saddle anesthesia. 2+ DP pulses  bilaterally. Sensation intact to light touch. Strength 5/5 in bilateral lower extremities.  Neurological: She is alert.  Skin: No rash noted. She is not diaphoretic.  Psychiatric: She has a normal mood and affect.  Nursing note and vitals reviewed.    ED Treatments / Results  Labs (all labs ordered are listed, but only abnormal results are displayed) Labs Reviewed  POC URINE PREG, ED    EKG  EKG Interpretation None       Radiology Dg Lumbar Spine Complete  Result Date: 06/15/2017 CLINICAL DATA:  Midline lumbosacral back pain. Pain radiates down both legs. Acute on chronic pain. EXAM: LUMBAR SPINE - COMPLETE 4+ VIEW COMPARISON:  None. FINDINGS: The alignment is maintained. Vertebral body heights are normal. There is no listhesis. The posterior  elements are intact. Disc spaces are preserved with mild scattered endplate spurring. No fracture. Sacroiliac joints are symmetric and normal. IMPRESSION: Minor spondylosis with endplate spurring.  No acute abnormality. Electronically Signed   By: Rubye Oaks M.D.   On: 06/15/2017 20:38   Dg Foot Complete Right  Result Date: 06/15/2017 CLINICAL DATA:  Medial right foot pain. EXAM: RIGHT FOOT COMPLETE - 3+ VIEW COMPARISON:  None. FINDINGS: There is no evidence of fracture or dislocation. Moderate plantar calcaneal spur. There is no evidence of arthropathy or other focal bone abnormality. Soft tissues are unremarkable. IMPRESSION: Plantar calcaneal spur.  Otherwise negative right foot radiographs. Electronically Signed   By: Rubye Oaks M.D.   On: 06/15/2017 20:39    Procedures Procedures (including critical care time)  Medications Ordered in ED Medications - No data to display   Initial Impression / Assessment and Plan / ED Course  I have reviewed the triage vital signs and the nursing notes.  Pertinent labs & imaging results that were available during my care of the patient were reviewed by me and considered in my medical  decision making (see chart for details).     Patient presents to ED for evaluation of chronic low back pain that has worsened in the past 3 days. States that pain is worse with movement and shoots down her right leg. She has been ambulatory since the pain began. She denies any numbness, weakness, urinary symptoms, falls or injuries. She denies any previous back surgery, history of cancer history of IV drug use. She is ambulatory here in the ED. I have low suspicion for cauda equina or other acute spinal cord abnormality being the cause of her back pain based on today's presentation. She does have midline lumbar spinal tenderness present. She also reports right foot pain for the past month. X-rays of the lumbar spine and foot returned as unremarkable for acute abnormality. We'll advise patient to take Medrol Dosepak and heat therapy as directed. Encouraged to follow up with PCP for further evaluation. Patient appears stable for discharge at this time. Strict return precautions given.  Final Clinical Impressions(s) / ED Diagnoses   Final diagnoses:  Chronic midline low back pain with bilateral sciatica    New Prescriptions New Prescriptions   METHYLPREDNISOLONE (MEDROL DOSEPAK) 4 MG TBPK TABLET    Taper over 6 days.     Dietrich Pates, PA-C 06/15/17 2108    Linwood Dibbles, MD 06/15/17 [phone]

## 2017-06-15 NOTE — Discharge Instructions (Signed)
Please read attached information regarding your condition and heat therapy. Take steroids in tapered dose as directed. Return to ED for worsening pain, numbness, falls, injuries, loss of bladder function.

## 2017-06-15 NOTE — ED Triage Notes (Signed)
Pt c/o lower bil back pain onset 2-3 days that radiates down bil legs, pt ambulatory, MAE, A&O x4, pt has chronic back pain

## 2017-08-06 ENCOUNTER — Ambulatory Visit: Payer: Self-pay | Admitting: Internal Medicine

## 2017-08-06 ENCOUNTER — Encounter: Payer: Self-pay | Admitting: Internal Medicine

## 2017-08-06 ENCOUNTER — Ambulatory Visit: Payer: Self-pay | Attending: Internal Medicine | Admitting: Internal Medicine

## 2017-08-06 VITALS — BP 114/78 | HR 91 | Temp 98.3°F | Resp 16 | Wt 264.8 lb

## 2017-08-06 DIAGNOSIS — F319 Bipolar disorder, unspecified: Secondary | ICD-10-CM | POA: Insufficient documentation

## 2017-08-06 DIAGNOSIS — Z79899 Other long term (current) drug therapy: Secondary | ICD-10-CM | POA: Insufficient documentation

## 2017-08-06 DIAGNOSIS — G8929 Other chronic pain: Secondary | ICD-10-CM | POA: Insufficient documentation

## 2017-08-06 DIAGNOSIS — F17210 Nicotine dependence, cigarettes, uncomplicated: Secondary | ICD-10-CM | POA: Insufficient documentation

## 2017-08-06 DIAGNOSIS — J45909 Unspecified asthma, uncomplicated: Secondary | ICD-10-CM | POA: Insufficient documentation

## 2017-08-06 DIAGNOSIS — M79674 Pain in right toe(s): Secondary | ICD-10-CM

## 2017-08-06 DIAGNOSIS — Z23 Encounter for immunization: Secondary | ICD-10-CM | POA: Insufficient documentation

## 2017-08-06 DIAGNOSIS — M47896 Other spondylosis, lumbar region: Secondary | ICD-10-CM | POA: Insufficient documentation

## 2017-08-06 DIAGNOSIS — Z9049 Acquired absence of other specified parts of digestive tract: Secondary | ICD-10-CM | POA: Insufficient documentation

## 2017-08-06 DIAGNOSIS — R42 Dizziness and giddiness: Secondary | ICD-10-CM | POA: Insufficient documentation

## 2017-08-06 DIAGNOSIS — F419 Anxiety disorder, unspecified: Secondary | ICD-10-CM | POA: Insufficient documentation

## 2017-08-06 DIAGNOSIS — M797 Fibromyalgia: Secondary | ICD-10-CM | POA: Insufficient documentation

## 2017-08-06 DIAGNOSIS — K589 Irritable bowel syndrome without diarrhea: Secondary | ICD-10-CM | POA: Insufficient documentation

## 2017-08-06 DIAGNOSIS — Z6841 Body Mass Index (BMI) 40.0 and over, adult: Secondary | ICD-10-CM

## 2017-08-06 DIAGNOSIS — Z885 Allergy status to narcotic agent status: Secondary | ICD-10-CM | POA: Insufficient documentation

## 2017-08-06 DIAGNOSIS — M47816 Spondylosis without myelopathy or radiculopathy, lumbar region: Secondary | ICD-10-CM | POA: Insufficient documentation

## 2017-08-06 DIAGNOSIS — G4700 Insomnia, unspecified: Secondary | ICD-10-CM

## 2017-08-06 MED ORDER — HYDROXYZINE HCL 25 MG PO TABS: 25.0000 mg | ORAL_TABLET | Freq: Every evening | ORAL | 2 refills | Status: DC | PRN

## 2017-08-06 NOTE — Progress Notes (Signed)
Patient ID: Kathy Conway, female    DOB: 07/19/1976  MRN: [id number]  CC: Follow-up   Subjective: Kathy Conway is a 41 y.o. female who presents for f/u visit. Boyfriend is with her Her concerns today include:  Pt with hx of obesity, chronic LBP, asthma,anxiety, vertigo, mood disorder, tob dep and bipolar  1. LBP: quit job 1 mth ago. 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 Disc space with preserved  -On last visit. Plan to refer to pain specialist when she qualifies for [facility] card or [facility] discount. she has to apply for Medicaid and be turned down first  before she could apply for [facility]  2. Knot RT 5th toe: Present for about 2 weeks. Slightly painful and shoots pain up lateral aspect of the foot  3. Insomnia: Trazodone increased on last visit. She reports that she is still not able to sleep well without having to take Benadryl with the trazodone. Now that she is no longer working she is up all hours of the night took a Seroquel from a friend and was able to sleep Was on Seroquel in the past but caused weight gain.  Mind races at night.  Patient Active Problem List   Diagnosis Date Noted  . Foot pain, bilateral 04/17/2017  . History of gestational diabetes 04/17/2017  . Bilateral hearing loss 03/24/2017  . Acute pain of right foot 03/24/2017  . Insomnia 03/05/2017  . Plantar fasciitis, left 10/23/2016  . Chronic bilateral low back pain with bilateral sciatica 09/08/2016  . IBS (irritable bowel syndrome) 05/23/2016  . Asthma   . Paresthesia of both hands 03/10/2016  . Boils 03/10/2016  . Anxiety state 03/10/2016  . H/O degenerative disc disease   . Fibromyalgia   . Vertigo 10/09/2009  . Migraine 10/09/1996     Current Outpatient Prescriptions on File Prior to Visit  Medication Sig Dispense Refill  . Acetaminophen-Caffeine (EXCEDRIN TENSION HEADACHE)  500-65 MG TABS Take 650 mg by mouth as needed.    . cholecalciferol (VITAMIN D) 1000 units tablet Take 2,000 Units by mouth daily.    . cyclobenzaprine (FLEXERIL) 10 MG tablet Take 1 tablet (10 mg total) by mouth at bedtime. 30 tablet 2  . diclofenac (VOLTAREN) 75 MG EC tablet Take 1 tablet (75 mg total) by mouth 2 (two) times daily. 30 tablet 0  . diphenoxylate-atropine (LOMOTIL) 2.5-0.025 MG tablet Take 1 tablet by mouth 4 (four) times daily as needed for diarrhea or loose stools. (Patient not taking: Reported on 04/17/2017) 60 tablet 2  . gabapentin (NEURONTIN) 300 MG capsule Take 1 capsule (300 mg total) by mouth 2 (two) times daily. 60 capsule 3  . ketoconazole (NIZORAL) 2 % cream Apply 1 application topically daily. 15 g 0  . meclizine (ANTIVERT) 25 MG tablet Take 1 tablet (25 mg total) by mouth 3 (three) times daily as needed. (Patient not taking: Reported on 06/05/2017) 60 tablet 2  . MELATONIN PO Take 2 tablets by mouth at bedtime as needed (sleep). gummys     . methylPREDNISolone (MEDROL DOSEPAK) 4 MG TBPK tablet Taper over 6 days. 21 tablet 0  . traZODone (DESYREL) 100 MG tablet Take 1 tablet (100 mg total) by mouth at bedtime as needed for sleep. 30 tablet 1  . vitamin B-12 (CYANOCOBALAMIN) 1000 MCG tablet Take 2,000 mcg by mouth daily. Gummies  No current facility-administered medications on file prior to visit.     Allergies  Allergen Reactions  . Codeine Shortness Of Breath  . Vicodin [Hydrocodone-Acetaminophen] Shortness Of Breath  . Iodine     Hives  . Latex Itching, Swelling and Rash    Social History   Social History  . Marital status: Single    Spouse name: N/A  . Number of children: N/A  . Years of education: N/A   Occupational History  . Not on file.   Social History Main Topics  . Smoking status: Current Every Day Smoker    Packs/day: 0.50    Types: Cigarettes  . Smokeless tobacco: Never Used  . Alcohol use No  . Drug use: No  . Sexual activity: Yes      Birth control/ protection: Surgical   Other Topics Concern  . Not on file   Social History Narrative  . No narrative on file    Family History  Problem Relation Age of Onset  . Cancer Mother   . Cancer Father   . Diabetes Maternal Grandfather     Past Surgical History:  Procedure Laterality Date  . ABDOMINAL HYSTERECTOMY    . CHOLECYSTECTOMY    . TUBAL LIGATION      ROS: Review of Systems Negative except as stated above PHYSICAL EXAM: BP 114/78   Pulse 91   Temp 98.3 F (36.8 C) (Oral)   Resp 16   Wt 264 lb 12.8 oz (120.1 kg)   SpO2 96%   BMI 44.07 kg/m   Wt Readings from Last 3 Encounters:  08/06/17 264 lb 12.8 oz (120.1 kg)  06/05/17 262 lb (118.8 kg)  04/17/17 257 lb 6.4 oz (116.8 kg)    Physical Exam  General appearance - alert, well appearing, and in no distress Mental status - alert, oriented to person, place, and time, normal mood, behavior, speech, dress, motor activity, and thought processes Neck - supple, no significant adenopathy Chest - clear to auscultation, no wheezes, rales or rhonchi, symmetric air entry Heart - normal rate, regular rhythm, normal S1, S2, no murmurs, rubs, clicks or gallops Musculoskeletal - tiny nodular density felt on palpation of dorsal surface RT 5th toe - ? Tendon gliding over bone.    ASSESSMENT AND PLAN: 1. Osteoarthritis of lumbar spine, unspecified spinal osteoarthritis complication status -Try to stay active. [facility] we can refer her for some physical therapy or to a pain specialist  2. Insomnia, unspecified type Good sleep hygiene discussed. Advised against using other people's medication. Seroquel not a good option for her as it can cause weight gain and predispose to diabetes - hydrOXYzine (ATARAX/VISTARIL) 25 MG tablet; Take 1 tablet (25 mg total) by mouth at bedtime as needed.  Dispense: 30 tablet; Refill: 2  3. Toe pain, right Observe for now. Try to wear  comfortable shoes  4. Need for influenza vaccination  - Flu Vaccine QUAD 6+ mos PF IM (Fluarix Quad PF)  5. Class 3 severe obesity due to excess calories without serious comorbidity with body mass index (BMI) of 40.0 to 44.9 in adult [facility](HCC) Encourage healthy eating habits and regular exercise. - TSH  Patient was given the opportunity to ask questions.  Patient verbalized understanding of the plan and was able to repeat key elements of the plan.   Orders Placed This Encounter  Procedures  . Flu Vaccine QUAD 6+ mos PF IM (Fluarix Quad PF)  Requested Prescriptions   Signed Prescriptions Disp Refills  . hydrOXYzine (ATARAX/VISTARIL) 25 MG tablet 30 tablet 2    Sig: Take 1 tablet (25 mg total) by mouth at bedtime as needed.    Return in about 4 months (around 12/06/2017).  Jonah Blue, MD, FACP

## 2017-08-06 NOTE — Patient Instructions (Addendum)
Insomnia Insomnia is a sleep disorder that makes it difficult to fall asleep or to stay asleep. Insomnia can cause tiredness (fatigue), low energy, difficulty concentrating, mood swings, and poor performance at work or school. There are three different ways to classify insomnia:  Difficulty falling asleep.  Difficulty staying asleep.  Waking up too early in the morning.  Any type of insomnia can be long-term (chronic) or short-term (acute). Both are common. Short-term insomnia usually lasts for three months or less. Chronic insomnia occurs at least three times a week for longer than three months. What are the causes? Insomnia may be caused by another condition, situation, or substance, such as:  Anxiety.  Certain medicines.  Gastroesophageal reflux disease (GERD) or other gastrointestinal conditions.  Asthma or other breathing conditions.  Restless legs syndrome, sleep apnea, or other sleep disorders.  Chronic pain.  Menopause. This may include hot flashes.  Stroke.  Abuse of alcohol, tobacco, or illegal drugs.  Depression.  Caffeine.  Neurological disorders, such as Alzheimer disease.  An overactive thyroid (hyperthyroidism).  The cause of insomnia may not be known. What increases the risk? Risk factors for insomnia include:  Gender. Women are more commonly affected than men.  Age. Insomnia is more common as you get older.  Stress. This may involve your professional or personal life.  Income. Insomnia is more common in people with lower income.  Lack of exercise.  Irregular work schedule or night shifts.  Traveling between different time zones.  What are the signs or symptoms? If you have insomnia, trouble falling asleep or trouble staying asleep is the main symptom. This may lead to other symptoms, such as:  Feeling fatigued.  Feeling nervous about going to sleep.  Not feeling rested in the morning.  Having trouble concentrating.  Feeling  irritable, anxious, or depressed.  How is this treated? Treatment for insomnia depends on the cause. If your insomnia is caused by an underlying condition, treatment will focus on addressing the condition. Treatment may also include:  Medicines to help you sleep.  Counseling or therapy.  Lifestyle adjustments.  Follow these instructions at home:  Take medicines only as directed by your health care provider.  Keep regular sleeping and waking hours. Avoid naps.  Keep a sleep diary to help you and your health care provider figure out what could be causing your insomnia. Include: ? When you sleep. ? When you wake up during the night. ? How well you sleep. ? How rested you feel the next day. ? Any side effects of medicines you are taking. ? What you eat and drink.  Make your bedroom a comfortable place where it is easy to fall asleep: ? Put up shades or special blackout curtains to block light from outside. ? Use a white noise machine to block noise. ? Keep the temperature cool.  Exercise regularly as directed by your health care provider. Avoid exercising right before bedtime.  Use relaxation techniques to manage stress. Ask your health care provider to suggest some techniques that may work well for you. These may include: ? Breathing exercises. ? Routines to release muscle tension. ? Visualizing peaceful scenes.  Cut back on alcohol, caffeinated beverages, and cigarettes, especially close to bedtime. These can disrupt your sleep.  Do not overeat or eat spicy foods right before bedtime. This can lead to digestive discomfort that can make it hard for you to sleep.  Limit screen use before bedtime. This includes: ? Watching TV. ? Using your smartphone, tablet, and   computer.  Stick to a routine. This can help you fall asleep faster. Try to do a quiet activity, brush your teeth, and go to bed at the same time each night.  Get out of bed if you are still awake after 15 minutes  of trying to sleep. Keep the lights down, but try reading or doing a quiet activity. When you feel sleepy, go back to bed.  Make sure that you drive carefully. Avoid driving if you feel very sleepy.  Keep all follow-up appointments as directed by your health care provider. This is important. Contact a health care provider if:  You are tired throughout the day or have trouble in your daily routine due to sleepiness.  You continue to have sleep problems or your sleep problems get worse. Get help right away if:  You have serious thoughts about hurting yourself or someone else. This information is not intended to replace advice given to you by your health care provider. Make sure you discuss any questions you have with your health care provider. Document Released: 09/22/2000 Document Revised: 02/25/2016 Document Reviewed: 06/26/2014 Elsevier Interactive Patient Education  2018 Elsevier Inc.   Influenza Virus Vaccine injection (Fluarix) What is this medicine? INFLUENZA VIRUS VACCINE (in floo EN zuh VAHY ruhs vak SEEN) helps to reduce the risk of getting influenza also known as the flu. This medicine may be used for other purposes; ask your health care provider or pharmacist if you have questions. COMMON BRAND NAME(S): Fluarix, Fluzone What should I tell my health care provider before I take this medicine? They need to know if you have any of these conditions: -bleeding disorder like hemophilia -fever or infection -Guillain-Barre syndrome or other neurological problems -immune system problems -infection with the human immunodeficiency virus (HIV) or AIDS -low blood platelet counts -multiple sclerosis -an unusual or allergic reaction to influenza virus vaccine, eggs, chicken proteins, latex, gentamicin, other medicines, foods, dyes or preservatives -pregnant or trying to get pregnant -breast-feeding How should I use this medicine? This vaccine is for injection into a muscle. It is given  by a health care professional. A copy of Vaccine Information Statements will be given before each vaccination. Read this sheet carefully each time. The sheet may change frequently. Talk to your pediatrician regarding the use of this medicine in children. Special care may be needed. Overdosage: If you think you have taken too much of this medicine contact a poison control center or emergency room at once. NOTE: This medicine is only for you. Do not share this medicine with others. What if I miss a dose? This does not apply. What may interact with this medicine? -chemotherapy or radiation therapy -medicines that lower your immune system like etanercept, anakinra, infliximab, and adalimumab -medicines that treat or prevent blood clots like warfarin -phenytoin -steroid medicines like prednisone or cortisone -theophylline -vaccines This list may not describe all possible interactions. Give your health care provider a list of all the medicines, herbs, non-prescription drugs, or dietary supplements you use. Also tell them if you smoke, drink alcohol, or use illegal drugs. Some items may interact with your medicine. What should I watch for while using this medicine? Report any side effects that do not go away within 3 days to your doctor or health care professional. Call your health care provider if any unusual symptoms occur within 6 weeks of receiving this vaccine. You may still catch the flu, but the illness is not usually as bad. You cannot get the flu from the vaccine.  The vaccine will not protect against colds or other illnesses that may cause fever. The vaccine is needed every year. What side effects may I notice from receiving this medicine? Side effects that you should report to your doctor or health care professional as soon as possible: -allergic reactions like skin rash, itching or hives, swelling of the face, lips, or tongue Side effects that usually do not require medical attention  (report to your doctor or health care professional if they continue or are bothersome): -fever -headache -muscle aches and pains -pain, tenderness, redness, or swelling at site where injected -weak or tired This list may not describe all possible side effects. Call your doctor for medical advice about side effects. You may report side effects to FDA at 1-800-FDA-1088. Where should I keep my medicine? This vaccine is only given in a clinic, pharmacy, doctor's office, or other health care setting and will not be stored at home. NOTE: This sheet is a summary. It may not cover all possible information. If you have questions about this medicine, talk to your doctor, pharmacist, or health care provider.  2018 Elsevier/Gold Standard (2008-04-22 09:30:40)

## 2017-08-07 LAB — TSH: TSH: 2.98 u[IU]/mL (ref 0.450–4.500)

## 2017-08-08 DIAGNOSIS — Z23 Encounter for immunization: Secondary | ICD-10-CM

## 2017-12-06 ENCOUNTER — Encounter: Payer: Self-pay | Admitting: Internal Medicine

## 2017-12-06 ENCOUNTER — Ambulatory Visit: Payer: Self-pay | Attending: Internal Medicine | Admitting: Internal Medicine

## 2017-12-06 VITALS — BP 118/86 | HR 105 | Temp 98.1°F | Resp 16 | Ht 65.0 in | Wt 279.6 lb

## 2017-12-06 DIAGNOSIS — F419 Anxiety disorder, unspecified: Secondary | ICD-10-CM | POA: Insufficient documentation

## 2017-12-06 DIAGNOSIS — Z79899 Other long term (current) drug therapy: Secondary | ICD-10-CM | POA: Insufficient documentation

## 2017-12-06 DIAGNOSIS — H9193 Unspecified hearing loss, bilateral: Secondary | ICD-10-CM | POA: Insufficient documentation

## 2017-12-06 DIAGNOSIS — M797 Fibromyalgia: Secondary | ICD-10-CM | POA: Insufficient documentation

## 2017-12-06 DIAGNOSIS — G4700 Insomnia, unspecified: Secondary | ICD-10-CM | POA: Insufficient documentation

## 2017-12-06 DIAGNOSIS — Z90710 Acquired absence of both cervix and uterus: Secondary | ICD-10-CM | POA: Insufficient documentation

## 2017-12-06 DIAGNOSIS — Z885 Allergy status to narcotic agent status: Secondary | ICD-10-CM | POA: Insufficient documentation

## 2017-12-06 DIAGNOSIS — Z8632 Personal history of gestational diabetes: Secondary | ICD-10-CM | POA: Insufficient documentation

## 2017-12-06 DIAGNOSIS — M47816 Spondylosis without myelopathy or radiculopathy, lumbar region: Secondary | ICD-10-CM

## 2017-12-06 DIAGNOSIS — Z9049 Acquired absence of other specified parts of digestive tract: Secondary | ICD-10-CM | POA: Insufficient documentation

## 2017-12-06 DIAGNOSIS — R0683 Snoring: Secondary | ICD-10-CM

## 2017-12-06 DIAGNOSIS — G8929 Other chronic pain: Secondary | ICD-10-CM | POA: Insufficient documentation

## 2017-12-06 DIAGNOSIS — F17210 Nicotine dependence, cigarettes, uncomplicated: Secondary | ICD-10-CM | POA: Insufficient documentation

## 2017-12-06 DIAGNOSIS — Z1331 Encounter for screening for depression: Secondary | ICD-10-CM

## 2017-12-06 DIAGNOSIS — J45909 Unspecified asthma, uncomplicated: Secondary | ICD-10-CM | POA: Insufficient documentation

## 2017-12-06 DIAGNOSIS — Z91040 Latex allergy status: Secondary | ICD-10-CM | POA: Insufficient documentation

## 2017-12-06 DIAGNOSIS — Z6841 Body Mass Index (BMI) 40.0 and over, adult: Secondary | ICD-10-CM

## 2017-12-06 DIAGNOSIS — K589 Irritable bowel syndrome without diarrhea: Secondary | ICD-10-CM | POA: Insufficient documentation

## 2017-12-06 MED ORDER — DULOXETINE HCL 30 MG PO CPEP: 30.0000 mg | ORAL_CAPSULE | Freq: Every day | ORAL | 3 refills | Status: DC

## 2017-12-06 MED FILL — hydrOXYzine HCL 25 MG TABS: 25 | 30 days supply | Qty: 30 | Fill #0

## 2017-12-06 MED FILL — GABAPENTIN 300 MG CAPSULE: 300 | 30 days supply | Qty: 30 | Fill #1

## 2017-12-06 MED FILL — CYCLOBENZAPRINE 10 MG TAB: 10 | 30 days supply | Qty: 30 | Fill #2

## 2017-12-06 MED FILL — !CYMBALTA 30MG CAPSULE: 30 | 30 days supply | Qty: 30 | Fill #0

## 2017-12-06 NOTE — Progress Notes (Signed)
Patient ID: Carilyn Goodpasturendria D [place], female    DOB: [birth year]  MRN: [id number]  CC: Follow-up   Subjective: Kathy Conway is a 42 y.o. female who presents for chronic ds management.  Boyfriend is with her.  Her concerns today include:  Pt with hx of obesity, chronic LBP, asthma,anxiety, vertigo, mood disorder, tob depand bipolar  1.  Chronic LBP: Did hear from Medicaid.  Has disability hearing in May. Plans to reapply for OC in mean time. Feels lower back and legs getting worse.  Has to stop about every 1/2 block and wait 5-10 mins. Trying to walk/exercise but becoming difficult.  Walking up hills "really get me."  Problems bending in shower to wash herself.   Gain 15 lbs since last visit. -eating habits not best.  She eats what she can afford.  Eats a lot of pizza, garlic bread, sodas. Little fruits and veggies.  2.  [facility] and new therapist. Saw last therapist a few mths ago but did not connect well with her.   + depression screen today.  She admits that she does have some depression issues.  Denies any suicidal ideation. Sleeps a lot during the day.  Snores loud at nights.  Reports having had sleep study about 7 years ago and tested negative for sleep apnea Patient Active Problem List   Diagnosis Date Noted  . 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 Class 3 severe obesity due to excess calories without serious comorbidity with body mass index (BMI) of 40.0 to 44.9 in adult (HCC) 08/06/2017  . History of gestational diabetes 04/17/2017  . Bilateral hearing loss 03/24/2017  . Acute pain of right foot 03/24/2017  . Insomnia 03/05/2017  . Plantar fasciitis, left 10/23/2016  . Chronic bilateral low back pain with bilateral sciatica 09/08/2016  . IBS (irritable bowel syndrome) 05/23/2016  . Asthma   . Boils 03/10/2016  . Anxiety state 03/10/2016  . H/O degenerative disc disease   . Fibromyalgia   . Migraine 10/09/1996      Current Outpatient Medications on File Prior to Visit  Medication Sig Dispense Refill  . Acetaminophen-Caffeine (EXCEDRIN TENSION HEADACHE) 500-65 MG TABS Take 650 mg by mouth as needed.    . cholecalciferol (VITAMIN D) 1000 units tablet Take 2,000 Units by mouth daily.    . cyclobenzaprine (FLEXERIL) 10 MG tablet Take 1 tablet (10 mg total) by mouth at bedtime. 30 tablet 2  . diclofenac (VOLTAREN) 75 MG EC tablet Take 1 tablet (75 mg total) by mouth 2 (two) times daily. (Patient not taking: Reported on 12/06/2017) 30 tablet 0  . gabapentin (NEURONTIN) 300 MG capsule Take 1 capsule (300 mg total) by mouth 2 (two) times daily. 60 capsule 3  . hydrOXYzine (ATARAX/VISTARIL) 25 MG tablet Take 1 tablet (25 mg total) by mouth at bedtime as needed. 30 tablet 2  . MELATONIN PO Take 2 tablets by mouth at bedtime as needed (sleep). gummys     . vitamin B-12 (CYANOCOBALAMIN) 1000 MCG tablet Take 2,000 mcg by mouth daily. Gummies     No current facility-administered medications on file prior to visit.     Allergies  Allergen Reactions  . Codeine Shortness Of Breath  . Vicodin [Hydrocodone-Acetaminophen] Shortness Of Breath  . Iodine     Hives  . Latex Itching, Swelling and Rash    Social History   Socioeconomic History  . Marital status: Single    Spouse name: Not  on file  . Number of children: Not on file  . Years of education: Not on file  . Highest education level: Not on file  Social Needs  . Financial resource strain: Not on file  . Food insecurity - worry: Not on file  . Food insecurity - inability: Not on file  . Transportation needs - medical: Not on file  . Transportation needs - non-medical: Not on file  Occupational History  . Not on file  Tobacco Use  . Smoking status: Current Every Day Smoker    Packs/day: 0.50    Types: Cigarettes  . Smokeless tobacco: Never Used  Substance and Sexual Activity  . Alcohol use: No  . Drug use: No  . Sexual activity: Yes    Birth  control/protection: Surgical  Other Topics Concern  . Not on file  Social History Narrative  . Not on file    Family History  Problem Relation Age of Onset  . Cancer Mother   . Cancer Father   . Diabetes Maternal Grandfather     Past Surgical History:  Procedure Laterality Date  . ABDOMINAL HYSTERECTOMY    . CHOLECYSTECTOMY    . TUBAL LIGATION      ROS: Review of Systems Negative except as stated above   PHYSICAL EXAM: BP 118/86   Pulse (!) 105   Temp 98.1 F (36.7 C) (Oral)   Resp 16   Ht 5\' 5"  (1.651 m)   Wt 279 lb 9.6 oz (126.8 kg)   SpO2 97%   BMI 46.53 kg/m   Wt Readings from Last 3 Encounters:  12/06/17 279 lb 9.6 oz (126.8 kg)  08/06/17 264 lb 12.8 oz (120.1 kg)  06/05/17 262 lb (118.8 kg)    Physical Exam  General appearance - alert, well appearing, obese Caucasian female and in no distress Mental status - alert, oriented to person, place, and time, normal mood, behavior, speech, dress, motor activity, and thought processes Neck - supple, no significant adenopathy Chest - clear to auscultation, no wheezes, rales or rhonchi, symmetric air entry Heart - normal rate, regular rhythm, normal S1, S2, no murmurs, rubs, clicks or gallops Musculoskeletal -mild to moderate tenderness on palpation of the lumbar spine. Extremities -no lower extremity edema.  Dorsalis pedis, posterior tibialis pulses are 3+ bilaterally, legs are warm.  No cyanosis  Depression screen [facility] 2/9 12/06/2017 08/06/2017 06/05/2017  Decreased Interest 2 2 1   Down, Depressed, Hopeless 0 0 1  PHQ - 2 Score 2 2 2   Altered sleeping 3 3 3   Tired, decreased energy 3 2 1   Change in appetite 3 3 3   Feeling bad or failure about yourself  1 0 1  Trouble concentrating 1 2 2   Moving slowly or fidgety/restless 0 0 0  Suicidal thoughts 0 0 1  PHQ-9 Score 13 12 13   Some recent data might be hidden     ASSESSMENT AND PLAN: 1. Osteoarthritis of lumbar spine, unspecified spinal osteoarthritis  complication status Strongly encourage weight loss. Since she is not able to do much walking, I recommend water aerobics at the [facility].  She will look into this.  We also discussed healthy eating habits.  I have encouraged her to cut back on eating the white carbohydrates and stop sugary drinks.  Drink more water or flavored water -She is agreeable to trying Cymbalta to help with back pain and depression -Will refer to pain specialist once she gets the orange card or Medicaid - DULoxetine (CYMBALTA) 30 MG  capsule; Take 1 capsule (30 mg total) by mouth daily.  Dispense: 30 capsule; Refill: 3  2. Class 3 severe obesity due to excess calories with serious comorbidity and body mass index (BMI) of 45.0 to 49.9 in adult [facility]) See #1 above  3. Positive depression screening - DULoxetine (CYMBALTA) 30 MG capsule; Take 1 capsule (30 mg total) by mouth daily.  Dispense: 30 capsule; Refill: 3  4. Habitual snoring Will refer for sleep study when she gets the orange card was approved for Medicaid   Patient was given the opportunity to ask questions.  Patient verbalized understanding of the plan and was able to repeat key elements of the plan.   No orders of the defined types were placed in this encounter.    Requested Prescriptions   Signed Prescriptions Disp Refills  . DULoxetine (CYMBALTA) 30 MG capsule 30 capsule 3    Sig: Take 1 capsule (30 mg total) by mouth daily.    Return in about 4 months (around 04/05/2018).  Jonah Blue, MD, FACP

## 2017-12-06 NOTE — Patient Instructions (Signed)
You need to work on getting your weight down.  [facility] as discussed.  I have included some information about healthy eating habits that she may find useful. Start Cymbalta as discussed to help with back pain and depression. Let me know once you have been approved for the orange card so that we can refer you to a pain specialist or some physical therapy and for sleep study that.  Follow a Healthy Eating Plan - You can do it! Limit sugary drinks.  Avoid sodas, sweet tea, sport or energy drinks, or fruit drinks.  Drink water, lo-fat milk, or diet drinks. Limit snack foods.   Cut back on candy, cake, cookies, chips, ice cream.  These are a special treat, only in small amounts. Eat plenty of vegetables.  Especially dark green, red, and orange vegetables. Aim for at least 3 servings a day. More is better! Include fruit in your daily diet.  Whole fruit is much healthier than fruit juice! Limit "white" bread, "white" pasta, "white" rice.   Choose "100% whole grain" products, brown or wild rice. Avoid fatty meats. Try "Meatless Monday" and choose eggs or beans one day a week.  When eating meat, choose lean meats like chicken, [place], and fish.  Grill, broil, or bake meats instead of frying, and eat poultry without the skin. Eat less salt.  Avoid frozen pizzas, frozen dinners and salty foods.  Use seasonings other than salt in cooking.  This can help blood pressure and keep you from swelling Beer, wine and liquor have calories.  If you can safely drink alcohol, limit to 1 drink per day for women, 2 drinks for men

## 2017-12-07 ENCOUNTER — Ambulatory Visit: Payer: Self-pay | Attending: Internal Medicine

## 2017-12-25 ENCOUNTER — Other Ambulatory Visit: Payer: Self-pay

## 2017-12-25 ENCOUNTER — Encounter (HOSPITAL_COMMUNITY): Payer: Self-pay | Admitting: Emergency Medicine

## 2017-12-25 ENCOUNTER — Emergency Department (HOSPITAL_COMMUNITY)
Admission: EM | Admit: 2017-12-25 | Discharge: 2017-12-25 | Disposition: A | Discharge status: Home / Self Care | Payer: Self-pay | Attending: Emergency Medicine | Admitting: Emergency Medicine

## 2017-12-25 ENCOUNTER — Emergency Department (HOSPITAL_COMMUNITY): Payer: Self-pay

## 2017-12-25 DIAGNOSIS — Z79899 Other long term (current) drug therapy: Secondary | ICD-10-CM | POA: Insufficient documentation

## 2017-12-25 DIAGNOSIS — M25532 Pain in left wrist: Secondary | ICD-10-CM | POA: Insufficient documentation

## 2017-12-25 DIAGNOSIS — F17210 Nicotine dependence, cigarettes, uncomplicated: Secondary | ICD-10-CM | POA: Insufficient documentation

## 2017-12-25 DIAGNOSIS — W19XXXA Unspecified fall, initial encounter: Secondary | ICD-10-CM | POA: Insufficient documentation

## 2017-12-25 DIAGNOSIS — J45909 Unspecified asthma, uncomplicated: Secondary | ICD-10-CM | POA: Insufficient documentation

## 2017-12-25 MED ORDER — NAPROXEN 375 MG PO TABS: 375.0000 mg | ORAL_TABLET | Freq: Two times a day (BID) | ORAL | 0 refills | Status: DC

## 2017-12-25 NOTE — ED Notes (Signed)
Family at bedside. 

## 2017-12-25 NOTE — ED Notes (Signed)
Pt reports she and her husband was playing around, tried to catch her fall with her L hand.  Started to have pain in her L wrist since.  Sxs onset 3 weeks ago.  Positive for swelling in her L wrist.  CMS intact.  Lump noted in her L wrist posteriorly.  Painful when palpated.

## 2017-12-25 NOTE — ED Triage Notes (Signed)
Onset 3 weeks ago developed left wrist pain when falling onto a bed. Pain worsening overtime. Radial pulse +2 cap refill less 3 seconds.

## 2017-12-25 NOTE — ED Provider Notes (Signed)
[facility] EMERGENCY DEPARTMENT Provider Note   CSN: [id number] Arrival date & time: 12/25/17  1445     History   Chief Complaint Chief Complaint  Patient presents with  . Wrist Pain    HPI Kathy Conway is a 42 y.o. female.  Patient complaining of left wrist pain, onset three weeks ago after a fall on to her outstretched hand.   The history is provided by the patient. No language interpreter was used.  Wrist Pain  This is a new problem. The current episode started more than 1 week ago. The problem occurs daily. The problem has been gradually worsening. She has tried rest for the symptoms. The treatment provided no relief.    Past Medical History:  Diagnosis Date  . Asthma 10/1993  . Fibromyalgia   . H/O degenerative disc disease   . IBS (irritable bowel syndrome) 10/2003  . Migraine 10/1996  . Renal disorder   . Sciatica   . Vertigo 10/2009    Patient Active Problem List   Diagnosis Date Noted  . 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 Class 3 severe obesity due to excess calories without serious comorbidity with body mass index (BMI) of 40.0 to 44.9 in adult (HCC) 08/06/2017  . History of gestational diabetes 04/17/2017  . Bilateral hearing loss 03/24/2017  . Acute pain of right foot 03/24/2017  . Insomnia 03/05/2017  . Plantar fasciitis, left 10/23/2016  . Chronic bilateral low back pain with bilateral sciatica 09/08/2016  . IBS (irritable bowel syndrome) 05/23/2016  . Asthma   . Boils 03/10/2016  . Anxiety state 03/10/2016  . H/O degenerative disc disease   . Fibromyalgia   . Migraine 10/09/1996    Past Surgical History:  Procedure Laterality Date  . ABDOMINAL HYSTERECTOMY    . CHOLECYSTECTOMY    . TUBAL LIGATION      OB History    Gravida Para Term Preterm AB Living   2 2       2    SAB TAB Ectopic Multiple Live Births                   Home Medications    Prior to Admission medications   Medication Sig Start  Date End Date Taking? Authorizing Provider  Acetaminophen-Caffeine (EXCEDRIN TENSION HEADACHE) 500-65 MG TABS Take 650 mg by mouth as needed.    [provider]  cholecalciferol (VITAMIN D) 1000 units tablet Take 2,000 Units by mouth daily.    [provider]  cyclobenzaprine (FLEXERIL) 10 MG tablet Take 1 tablet (10 mg total) by mouth at bedtime. 03/01/17   Funches, Gerilyn Nestle, MD  diclofenac (VOLTAREN) 75 MG EC tablet Take 1 tablet (75 mg total) by mouth 2 (two) times daily. Patient not taking: Reported on 12/06/2017 03/22/17   Dessa Phi, MD  DULoxetine (CYMBALTA) 30 MG capsule Take 1 capsule (30 mg total) by mouth daily. 12/06/17   Marcine Matar, MD  gabapentin (NEURONTIN) 300 MG capsule Take 1 capsule (300 mg total) by mouth 2 (two) times daily. 06/05/17   Marcine Matar, MD  hydrOXYzine (ATARAX/VISTARIL) 25 MG tablet Take 1 tablet (25 mg total) by mouth at bedtime as needed. 08/06/17   Marcine Matar, MD  MELATONIN PO Take 2 tablets by mouth at bedtime as needed (sleep). gummys     [provider]  vitamin B-12 (CYANOCOBALAMIN) 1000 MCG tablet Take 2,000 mcg by mouth daily. Gummies    [provider]  Family History Family History  Problem Relation Age of Onset  . Cancer Mother   . Cancer Father   . Diabetes Maternal Grandfather     Social History Social History   Tobacco Use  . Smoking status: Current Every Day Smoker    Packs/day: 0.50    Types: Cigarettes  . Smokeless tobacco: Never Used  Substance Use Topics  . Alcohol use: No  . Drug use: No     Allergies   Codeine; Vicodin [hydrocodone-acetaminophen]; Iodine; and Latex   Review of Systems Review of Systems  Musculoskeletal: Positive for arthralgias.  All other systems reviewed and are negative.    Physical Exam Updated Vital Signs BP 128/89 (BP Location: Right Arm)   Pulse 89   Temp 98.2 F (36.8 C) (Oral)   Resp 18   Ht 5\' 6"  (1.676 m)   Wt 123.8 kg  (273 lb)   SpO2 98%   BMI 44.06 kg/m   Physical Exam  Constitutional: She is oriented to person, place, and time. She appears well-developed and well-nourished.  HENT:  Head: Atraumatic.  Eyes: Conjunctivae are normal.  Neck: Neck supple.  Cardiovascular: Normal rate and regular rhythm.  Pulmonary/Chest: Effort normal and breath sounds normal.  Abdominal: Soft. Bowel sounds are normal.  Musculoskeletal: She exhibits tenderness. She exhibits no deformity.  Neurological: She is alert and oriented to person, place, and time.  Skin: Skin is warm and dry.  Psychiatric: She has a normal mood and affect.  Nursing note and vitals reviewed.    ED Treatments / Results  Labs (all labs ordered are listed, but only abnormal results are displayed) Labs Reviewed - No data to display  EKG  EKG Interpretation None       Radiology Dg Wrist Complete Left  Result Date: 12/25/2017 CLINICAL DATA:  Injured 3 weeks ago falling on outstretched hand. Radial side pain. EXAM: LEFT WRIST - COMPLETE 3+ VIEW COMPARISON:  None. FINDINGS: There is no evidence of fracture or dislocation. There is no evidence of arthropathy or other focal bone abnormality. Soft tissues are unremarkable. IMPRESSION: Negative. Electronically Signed   By: Paulina FusiMark  Shogry M.D.   On: 12/25/2017 16:31    Procedures Procedures (including critical care time)  Medications Ordered in ED Medications - No data to display   Initial Impression / Assessment and Plan / ED Course  I have reviewed the triage vital signs and the nursing notes.  Pertinent labs & imaging results that were available during my care of the patient were reviewed by me and considered in my medical decision making (see chart for details).    Patient X-Ray negative for obvious fracture or dislocation.  Pt advised to follow up with orthopedics. Patient given wrist splint while in ED, conservative therapy recommended and discussed. Patient will be discharged home  & is agreeable with above plan. Returns precautions discussed. Pt appears safe for discharge.  Final Clinical Impressions(s) / ED Diagnoses   Final diagnoses:  Left wrist pain    ED Discharge Orders        Ordered    naproxen (NAPROSYN) 375 MG tablet  2 times daily     12/25/17 1833       Felicie MornSmith, Cherrise Occhipinti, NP 12/25/17 2147    Maia PlanLong, Joshua G, MD 12/26/17 1436

## 2018-02-13 ENCOUNTER — Ambulatory Visit: Payer: Self-pay | Attending: Internal Medicine | Admitting: Internal Medicine

## 2018-02-13 ENCOUNTER — Encounter: Payer: Self-pay | Admitting: Internal Medicine

## 2018-02-13 VITALS — BP 116/88 | HR 90 | Temp 98.2°F | Resp 16 | Wt 278.2 lb

## 2018-02-13 DIAGNOSIS — F17210 Nicotine dependence, cigarettes, uncomplicated: Secondary | ICD-10-CM | POA: Insufficient documentation

## 2018-02-13 DIAGNOSIS — M7062 Trochanteric bursitis, left hip: Secondary | ICD-10-CM

## 2018-02-13 DIAGNOSIS — M722 Plantar fascial fibromatosis: Secondary | ICD-10-CM | POA: Insufficient documentation

## 2018-02-13 DIAGNOSIS — Z888 Allergy status to other drugs, medicaments and biological substances status: Secondary | ICD-10-CM | POA: Insufficient documentation

## 2018-02-13 DIAGNOSIS — M5416 Radiculopathy, lumbar region: Secondary | ICD-10-CM

## 2018-02-13 DIAGNOSIS — Z885 Allergy status to narcotic agent status: Secondary | ICD-10-CM | POA: Insufficient documentation

## 2018-02-13 DIAGNOSIS — M797 Fibromyalgia: Secondary | ICD-10-CM | POA: Insufficient documentation

## 2018-02-13 DIAGNOSIS — G4700 Insomnia, unspecified: Secondary | ICD-10-CM | POA: Insufficient documentation

## 2018-02-13 DIAGNOSIS — F411 Generalized anxiety disorder: Secondary | ICD-10-CM | POA: Insufficient documentation

## 2018-02-13 DIAGNOSIS — J45909 Unspecified asthma, uncomplicated: Secondary | ICD-10-CM | POA: Insufficient documentation

## 2018-02-13 DIAGNOSIS — G43509 Persistent migraine aura without cerebral infarction, not intractable, without status migrainosus: Secondary | ICD-10-CM

## 2018-02-13 DIAGNOSIS — K589 Irritable bowel syndrome without diarrhea: Secondary | ICD-10-CM | POA: Insufficient documentation

## 2018-02-13 DIAGNOSIS — Z79899 Other long term (current) drug therapy: Secondary | ICD-10-CM | POA: Insufficient documentation

## 2018-02-13 DIAGNOSIS — Z6841 Body Mass Index (BMI) 40.0 and over, adult: Secondary | ICD-10-CM | POA: Insufficient documentation

## 2018-02-13 DIAGNOSIS — G8929 Other chronic pain: Secondary | ICD-10-CM | POA: Insufficient documentation

## 2018-02-13 MED ORDER — KETOROLAC TROMETHAMINE 60 MG/2ML IM SOLN
60.0000 mg | Freq: Once | INTRAMUSCULAR | Status: AC
  Administered 2018-02-13: 60 mg via INTRAMUSCULAR

## 2018-02-13 MED ORDER — TOPIRAMATE 25 MG PO TABS: 25.0000 mg | ORAL_TABLET | Freq: Every day | ORAL | 4 refills | Status: DC

## 2018-02-13 MED ORDER — SUMATRIPTAN SUCCINATE 50 MG PO TABS: ORAL_TABLET | ORAL | 1 refills | Status: DC

## 2018-02-13 MED ORDER — IBUPROFEN 600 MG PO TABS: 600.0000 mg | ORAL_TABLET | Freq: Three times a day (TID) | ORAL | 0 refills | Status: DC | PRN

## 2018-02-13 NOTE — Progress Notes (Signed)
Pt states she has a severe headache for 2 days that will not go away  Pt states she has tried otc medicine and nothing has helped  Pt states she has tried laying in the dark, blocking out noises and that has not helped at well

## 2018-02-13 NOTE — Progress Notes (Signed)
Patient ID: Kathy Conway, female    DOB: 03-30-1976  MRN: [id number]  CC: Follow-up and Headache   Subjective: Kathy Conway is a 42 y.o. female who presents for chronic ds management.  Boyfriend is with her.  Her concerns today include:  Pt with hx of obesity, chronic LBP, asthma,anxiety, vertigo, mood disorder, tob depand bipolar  C/o headache intermittently x 1 wk but persistent for past 2 days.  She did not sleep well last night as she was very anxious about her disability hearing that she has today. -was taking Aleve/Ibuprofen ( )  Q 4 hrs, some relief with this that would last a few hrs Tried laying in dark quite room and took hot showers.  Helps to lay down and squeez head b/w both hands -endorses photophobia and nausea and spots in visual field -both sides.  Starts at base of neck and moves forward. Hx of migraine.  Triggers include stress and lack of sleep.  Last one was 2-3 yrs ago.  Similar to current episode.  Also pain in lower back that she describes as burning pain that shoots down the legs.  Still living in a motel and does a lot of walking, eg she walks  to grocery store every day which is 40 mins total RT ankle swells all the time -She has cut back on eating pizza and garlic bread since last visit X-rays of the lumbar spine done in September of last year revealed minor spondylosis with endplate spurring Patient Active Problem List   Diagnosis Date Noted  . 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 Class 3 severe obesity due to excess calories without serious comorbidity with body mass index (BMI) of 40.0 to 44.9 in adult (HCC) 08/06/2017  . History of gestational diabetes 04/17/2017  . Bilateral hearing loss 03/24/2017  . Acute pain of right foot 03/24/2017  . Insomnia 03/05/2017  . Plantar fasciitis, left 10/23/2016  . Chronic bilateral low back pain with bilateral sciatica 09/08/2016  . IBS (irritable bowel syndrome) 05/23/2016  . Asthma   . Boils  03/10/2016  . Anxiety state 03/10/2016  . H/O degenerative disc disease   . Fibromyalgia   . Migraine 10/09/1996     Current Outpatient Medications on File Prior to Visit  Medication Sig Dispense Refill  . cyclobenzaprine (FLEXERIL) 10 MG tablet Take 1 tablet (10 mg total) by mouth at bedtime. 30 tablet 2  . DULoxetine (CYMBALTA) 30 MG capsule Take 1 capsule (30 mg total) by mouth daily. 30 capsule 3  . gabapentin (NEURONTIN) 300 MG capsule Take 1 capsule (300 mg total) by mouth 2 (two) times daily. 60 capsule 3  . hydrOXYzine (ATARAX/VISTARIL) 25 MG tablet Take 1 tablet (25 mg total) by mouth at bedtime as needed. 30 tablet 2  . naproxen (NAPROSYN) 375 MG tablet Take 1 tablet (375 mg total) by mouth 2 (two) times daily. 20 tablet 0  . vitamin B-12 (CYANOCOBALAMIN) 1000 MCG tablet Take 2,000 mcg by mouth daily. Gummies    . Acetaminophen-Caffeine (EXCEDRIN TENSION HEADACHE) 500-65 MG TABS Take 650 mg by mouth as needed.    . cholecalciferol (VITAMIN D) 1000 units tablet Take 2,000 Units by mouth daily.    . diclofenac (VOLTAREN) 75 MG EC tablet Take 1 tablet (75 mg total) by mouth 2 (two) times daily. (Patient not taking: Reported on 12/06/2017) 30 tablet 0  . MELATONIN PO Take 2 tablets by mouth at bedtime as needed (sleep). gummys      No  current facility-administered medications on file prior to visit.     Allergies  Allergen Reactions  . Codeine Shortness Of Breath  . Vicodin [Hydrocodone-Acetaminophen] Shortness Of Breath  . Iodine     Hives  . Latex Itching, Swelling and Rash    Social History   Socioeconomic History  . Marital status: Single    Spouse name: Not on file  . Number of children: Not on file  . Years of education: Not on file  . Highest education level: Not on file  Occupational History  . Not on file  Social Needs  . Financial resource strain: Not on file  . Food insecurity:    Worry: Not on file    Inability: Not on file  . Transportation needs:     Medical: Not on file    Non-medical: Not on file  Tobacco Use  . Smoking status: Current Every Day Smoker    Packs/day: 0.50    Types: Cigarettes  . Smokeless tobacco: Never Used  Substance and Sexual Activity  . Alcohol use: No  . Drug use: No  . Sexual activity: Yes    Birth control/protection: Surgical  Lifestyle  . Physical activity:    Days per week: Not on file    Minutes per session: Not on file  . Stress: Not on file  Relationships  . Social connections:    Talks on phone: Not on file    Gets together: Not on file    Attends religious service: Not on file    Active member of club or organization: Not on file    Attends meetings of clubs or organizations: Not on file    Relationship status: Not on file  . Intimate partner violence:    Fear of current or ex partner: Not on file    Emotionally abused: Not on file    Physically abused: Not on file    Forced sexual activity: Not on file  Other Topics Concern  . Not on file  Social History Narrative  . Not on file    Family History  Problem Relation Age of Onset  . Cancer Mother   . Cancer Father   . Diabetes Maternal Grandfather     Past Surgical History:  Procedure Laterality Date  . ABDOMINAL HYSTERECTOMY    . CHOLECYSTECTOMY    . TUBAL LIGATION      ROS: Review of Systems Negative except as stated above PHYSICAL EXAM: BP 116/88   Pulse 90   Temp 98.2 F (36.8 C) (Oral)   Resp 16   Wt 278 lb 3.2 oz (126.2 kg)   SpO2 96%   BMI 44.90 kg/m   Wt Readings from Last 3 Encounters:  02/13/18 278 lb 3.2 oz (126.2 kg)  12/25/17 273 lb (123.8 kg)  12/06/17 279 lb 9.6 oz (126.8 kg)    Physical Exam  General appearance - alert, well appearing, obese female and in no distress Mental status - normal mood, behavior, speech, dress, motor activity, and thought processes Neurological - cranial nerves II through XII intact, normal muscle tone, no tremors, strength 5/5.  Gross sensation intact Musculoskeletal  -no tenderness on palpation of the lumbar spine and surrounding paraspinal muscles.  Straight leg raise reproduces pain in the lower back.  Mild to moderate tenderness over the left trochanteric bursa.  Mild discomfort with rotation of the left hip  ASSESSMENT AND PLAN: 1. 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  Patient given Toradol shot today.  Prescribe Imitrex To take as needed for abortive therapy.  Topamax to take at bedtime to decrease frequency of the headaches. - ketorolac (TORADOL) injection 60 mg - topiramate (TOPAMAX) 25 MG tablet; Take 1 tablet (25 mg total) by mouth at bedtime.  Dispense: 30 tablet; Refill: 4 - SUMAtriptan (IMITREX) 50 MG tablet; Take 1 take at start of headache.  May repeat in 2 hrs if no relief.  Max 100 mg/24 hr  Dispense: 10 tablet; Refill: 1  2. Lumbar back pain with radiculopathy affecting lower extremity - ketorolac (TORADOL) injection 60 mg - ibuprofen (ADVIL,MOTRIN) 600 MG tablet; Take 1 tablet (600 mg total) by mouth every 8 (eight) hours as needed.  Dispense: 60 tablet; Refill: 0  3. Trochanteric bursitis of left hip - ketorolac (TORADOL) injection 60 mg - ibuprofen (ADVIL,MOTRIN) 600 MG tablet; Take 1 tablet (600 mg total) by mouth every 8 (eight) hours as needed.  Dispense: 60 tablet; Refill: 0  Patient was given the opportunity to ask questions.  Patient verbalized understanding of the plan and was able to repeat key elements of the plan.   No orders of the defined types were placed in this encounter.    Requested Prescriptions   Signed Prescriptions Disp Refills  . topiramate (TOPAMAX) 25 MG tablet 30 tablet 4    Sig: Take 1 tablet (25 mg total) by mouth at bedtime.  . SUMAtriptan (IMITREX) 50 MG tablet 10 tablet 1    Sig: Take 1 take at start of headache.  May repeat in 2 hrs if no relief.  Max 100 mg/24 hr  . ibuprofen (ADVIL,MOTRIN) 600 MG  tablet 60 tablet 0    Sig: Take 1 tablet (600 mg total) by mouth every 8 (eight) hours as needed.    Return in about 3 months (around 05/16/2018).  Jonah Blue, MD, FACP

## 2018-02-19 ENCOUNTER — Encounter: Payer: Self-pay | Admitting: Internal Medicine

## 2018-02-19 NOTE — Progress Notes (Signed)
Rec letter from Stasia Cavalier, MSW, LCSW from [place] informing me that pt was seen for comprehensive clinical assessment on 12/07/2017.  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  Sender stated that it is unlikely that she will be able to retain gainful employment due to her psychiatric issues.

## 2018-03-14 ENCOUNTER — Emergency Department (HOSPITAL_COMMUNITY)
Admission: EM | Admit: 2018-03-14 | Discharge: 2018-03-14 | Disposition: A | Discharge status: Home / Self Care | Payer: Self-pay | Attending: Emergency Medicine | Admitting: Emergency Medicine

## 2018-03-14 ENCOUNTER — Other Ambulatory Visit: Payer: Self-pay

## 2018-03-14 ENCOUNTER — Emergency Department (HOSPITAL_COMMUNITY): Payer: Self-pay

## 2018-03-14 ENCOUNTER — Encounter (HOSPITAL_COMMUNITY): Payer: Self-pay | Admitting: Emergency Medicine

## 2018-03-14 DIAGNOSIS — M546 Pain in thoracic spine: Secondary | ICD-10-CM | POA: Insufficient documentation

## 2018-03-14 DIAGNOSIS — J45909 Unspecified asthma, uncomplicated: Secondary | ICD-10-CM | POA: Insufficient documentation

## 2018-03-14 DIAGNOSIS — Z79899 Other long term (current) drug therapy: Secondary | ICD-10-CM | POA: Insufficient documentation

## 2018-03-14 DIAGNOSIS — F17210 Nicotine dependence, cigarettes, uncomplicated: Secondary | ICD-10-CM | POA: Insufficient documentation

## 2018-03-14 DIAGNOSIS — Z91040 Latex allergy status: Secondary | ICD-10-CM | POA: Insufficient documentation

## 2018-03-14 MED ORDER — PREDNISONE 10 MG PO TABS: 40.0000 mg | ORAL_TABLET | Freq: Every day | ORAL | 0 refills | Status: AC

## 2018-03-14 MED ORDER — PREDNISONE 20 MG PO TABS
60.0000 mg | ORAL_TABLET | Freq: Once | ORAL | Status: AC
  Administered 2018-03-14: 60 mg via ORAL
  Filled 2018-03-14: qty 3

## 2018-03-14 MED ORDER — METHOCARBAMOL 500 MG PO TABS: 500.0000 mg | ORAL_TABLET | Freq: Three times a day (TID) | ORAL | 0 refills | Status: AC

## 2018-03-14 MED ORDER — IBUPROFEN 400 MG PO TABS
400.0000 mg | ORAL_TABLET | Freq: Once | ORAL | Status: AC
  Administered 2018-03-14: 400 mg via ORAL
  Filled 2018-03-14: qty 1

## 2018-03-14 MED ORDER — ACETAMINOPHEN 500 MG PO TABS
1000.0000 mg | ORAL_TABLET | Freq: Once | ORAL | Status: AC
  Administered 2018-03-14: 1000 mg via ORAL
  Filled 2018-03-14: qty 2

## 2018-03-14 NOTE — ED Notes (Signed)
Pt has swelling in legs bilaterally pt reports hx of same when sitting for a long period of time. States she elevates her legs when at home. This RN elevated pt legs in chair. Pulses palpable in legs bilaterally.

## 2018-03-14 NOTE — ED Provider Notes (Signed)
[facility] EMERGENCY DEPARTMENT Provider Note   CSN: [id number] Arrival date & time: 03/14/18  1433     History   Chief Complaint Chief Complaint  Patient presents with  . Back Pain    HPI Kathy Conway is a 42 y.o. female w/ h/o chronic back pain here for evaluation of sudden onset, gradually worsening pain to right thoracic back that radiates to right scapula, right posterior arm and elbow. Pain is moderate, constant but intermittently worse with certain movements and palpation. Pain described as sharp. Pain onset soon after she was getting off the city bus, states the city bus abruptly stopped and she slid forward. She thinks she may have a pinched nerve. She has chronic low back pain secondary to "disc disease" and "bulging disc", she has had pinched nerves before and this feels similar.  Aggravating factors include direct palpation, rotating her trunk sideways, and movement of right upper extremity/shoulder and neck, taking a deep breath.  No interventions PTA. Alleviated slightly by rest and avoiding movement. Denies paresthesias, numbness, rash, direct trauma. No h/o DVT or PE. No recent prolonged travel or immobilization, estrogen use, h/o malignancy, shortness of breath. She has chronic intermittent lower extremity swelling and "burning" to bilateral feet that is unchanged.   HPI  Past Medical History:  Diagnosis Date  . Asthma 10/1993  . Fibromyalgia   . H/O degenerative disc disease   . IBS (irritable bowel syndrome) 10/2003  . Migraine 10/1996  . Renal disorder   . Sciatica   . Vertigo 10/2009    Patient Active Problem List   Diagnosis Date Noted  . 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 Class 3 severe obesity due to excess calories without serious comorbidity with body mass index (BMI) of 40.0 to 44.9 in adult (HCC) 08/06/2017  . History of gestational diabetes 04/17/2017  . Bilateral hearing loss 03/24/2017  . Acute pain of right foot  03/24/2017  . Insomnia 03/05/2017  . Plantar fasciitis, left 10/23/2016  . Chronic bilateral low back pain with bilateral sciatica 09/08/2016  . IBS (irritable bowel syndrome) 05/23/2016  . Asthma   . Boils 03/10/2016  . Anxiety state 03/10/2016  . H/O degenerative disc disease   . Fibromyalgia   . Migraine 10/09/1996    Past Surgical History:  Procedure Laterality Date  . ABDOMINAL HYSTERECTOMY    . CHOLECYSTECTOMY    . TUBAL LIGATION       OB History    Gravida  2   Para  2   Term      Preterm      AB      Living  2     SAB      TAB      Ectopic      Multiple      Live Births               Home Medications    Prior to Admission medications   Medication Sig Start Date End Date Taking? Authorizing Provider  DULoxetine (CYMBALTA) 30 MG capsule Take 1 capsule (30 mg total) by mouth daily. 12/06/17  Yes Marcine Matar, MD  gabapentin (NEURONTIN) 300 MG capsule Take 1 capsule (300 mg total) by mouth 2 (two) times daily. 06/05/17  Yes Marcine Matar, MD  hydrOXYzine (ATARAX/VISTARIL) 25 MG tablet Take 1 tablet (25 mg total) by mouth at bedtime as needed. Patient taking differently: Take 25 mg by mouth at bedtime as needed for itching.  08/06/17  Yes Marcine Matar, MD  naproxen sodium (ALEVE) 220 MG tablet Take 220 mg by mouth daily as needed (migraine).   Yes [provider]  cyclobenzaprine (FLEXERIL) 10 MG tablet Take 1 tablet (10 mg total) by mouth at bedtime. Patient not taking: Reported on 03/14/2018 03/01/17   Dessa Phi, MD  diclofenac (VOLTAREN) 75 MG EC tablet Take 1 tablet (75 mg total) by mouth 2 (two) times daily. Patient not taking: Reported on 12/06/2017 03/22/17   Dessa Phi, MD  ibuprofen (ADVIL,MOTRIN) 600 MG tablet Take 1 tablet (600 mg total) by mouth every 8 (eight) hours as needed. Patient not taking: Reported on 03/14/2018 02/13/18   Marcine Matar, MD  methocarbamol (ROBAXIN) 500 MG tablet Take 1 tablet (500  mg total) by mouth 3 (three) times daily for 5 days. 03/14/18 03/19/18  Liberty Handy, PA-C  naproxen (NAPROSYN) 375 MG tablet Take 1 tablet (375 mg total) by mouth 2 (two) times daily. Patient not taking: Reported on 03/14/2018 12/25/17   Felicie Morn, NP  predniSONE (DELTASONE) 10 MG tablet Take 4 tablets (40 mg total) by mouth daily for 5 days. 03/14/18 03/19/18  Liberty Handy, PA-C  SUMAtriptan (IMITREX) 50 MG tablet Take 1 take at start of headache.  May repeat in 2 hrs if no relief.  Max 100 mg/24 hr Patient not taking: Reported on 03/14/2018 02/13/18   Marcine Matar, MD  topiramate (TOPAMAX) 25 MG tablet Take 1 tablet (25 mg total) by mouth at bedtime. Patient not taking: Reported on 03/14/2018 02/13/18   Marcine Matar, MD    Family History Family History  Problem Relation Age of Onset  . Cancer Mother   . Cancer Father   . Diabetes Maternal Grandfather     Social History Social History   Tobacco Use  . Smoking status: Current Every Day Smoker    Packs/day: 0.50    Types: Cigarettes  . Smokeless tobacco: Never Used  Substance Use Topics  . Alcohol use: No  . Drug use: No     Allergies   Codeine; Vicodin [hydrocodone-acetaminophen]; Iodine; and Latex   Review of Systems Review of Systems  Cardiovascular: Positive for leg swelling (chronic).  Musculoskeletal: Positive for back pain and myalgias.  All other systems reviewed and are negative.    Physical Exam Updated Vital Signs BP (!) 141/101 (BP Location: Left Arm)   Pulse 83   Temp 98.5 F (36.9 C) (Oral)   Resp 20   Ht 5\' 6"  (1.676 m)   Wt 126.1 kg (278 lb)   SpO2 97%   BMI 44.87 kg/m   Physical Exam  Constitutional: She is oriented to person, place, and time. She appears well-developed and well-nourished.  Non-toxic appearance.  HENT:  Head: Normocephalic.  Right Ear: External ear normal.  Left Ear: External ear normal.  Nose: Nose normal.  Eyes: Conjunctivae and EOM are normal.  Neck: Full  passive range of motion without pain. Muscular tenderness present.  c-spine: no muscular or midline tenderness. Full PROM of neck without rigidity.   Cardiovascular: Normal rate.  2+ radial, ulnar, DP pulses bilaterally. Trace pitting edema mostly around ankles, no asymmetric calf edema, no calf tenderness, no lower extremity erythema, warmth.  Pulmonary/Chest: Effort normal. No tachypnea. No respiratory distress.  Musculoskeletal: Normal range of motion. She exhibits tenderness.       Thoracic back: She exhibits tenderness.       Back:  Thoracic spine: midline tenderness to approx T1-6.  Tenderness to right thoracic muscles, trapezius and muscle below right scapula. Pain reproduced with PROM of right shoulder and neck right flexion and bend.   Neurological: She is alert and oriented to person, place, and time.  5/5 strength with shoulder raise, abduction and adduction, bilaterally.  5/5 strength with elbow flexion and extension, bilaterally.  5/5 strength with wrist flexion and extension.  5/5 strength with finger abduction Good pincer and hand grip bilaterally.  Sensation to light touch intact in C3-C7, median, ulnar and radial nerve distribution, bilaterally.   Skin: Skin is warm and dry. Capillary refill takes less than 2 seconds.  Psychiatric: Her behavior is normal. Thought content normal.     ED Treatments / Results  Labs (all labs ordered are listed, but only abnormal results are displayed) Labs Reviewed - No data to display  EKG None  Radiology Dg Thoracic Spine 2 View  Result Date: 03/14/2018 CLINICAL DATA:  Sudden onset of severe RIGHT side neck pain, arm pain, and back pain while riding on a bus, history of chronic back pain with stenosis EXAM: THORACIC SPINE 2 VIEWS COMPARISON:  Chest radiograph 04/10/2017 FINDINGS: Osseous mineralization normal. 12 pairs of ribs. Vertebral body heights maintained without fracture or subluxation. No bone destruction. IMPRESSION: No acute  thoracic spine abnormalities. Electronically Signed   By: Ulyses SouthwardMark  Boles M.D.   On: 03/14/2018 17:18    Procedures Procedures (including critical care time)  Medications Ordered in ED Medications  predniSONE (DELTASONE) tablet 60 mg (60 mg Oral Given 03/14/18 1730)  acetaminophen (TYLENOL) tablet 1,000 mg (1,000 mg Oral Given 03/14/18 1730)  ibuprofen (ADVIL,MOTRIN) tablet 400 mg (400 mg Oral Given 03/14/18 1730)     Initial Impression / Assessment and Plan / ED Course  I have reviewed the triage vital signs and the nursing notes.  Pertinent labs & imaging results that were available during my care of the patient were reviewed by me and considered in my medical decision making (see chart for details).     42 year old here with pain to thoracic spine.  Question mild trauma. She has history of sciatica, disc disease and spinal stenosis and feels symptoms are most similar to sciatica.  On exam she has reproducible right-sided thoracic muscular tenderness that is reproduced with direct palpation, active range of motion of the neck and right shoulder.  No overlying rash.  No focal one-sided neurological deficits to extremities.  No fevers, chills, meningeal signs.  She has no midline cervical spine tenderness or neck pain, negative Spurling's maneuver.  I do not think cervical imaging indicated today. My suspicion is highest for MSK versus spasm versus radicular pain.  She does have chronic, intermittent ankle swelling that is unchanged.  She has no chest pain or shortness of breath.  Her pain does get worse with deep inspiration but I have low suspicion for PE.  It would be rare for PE to cause radiating pain to right upper extremity. PERC negative.  I discussed with patient that my suspicion is low for PE as she has no risk factors.   Final Clinical Impressions(s) / ED Diagnoses     1725: X-rays reviewed and unremarkable. Will discharge with prednisone, muscle relaxer, high-dose NSAIDs for radicular  vs MSK etiology.  She needs follow-up within 1 week with PCP for reevaluation.  I discussed return precautions.  She is in agreement.  Final diagnoses:  Acute right-sided thoracic back pain    ED Discharge Orders        Ordered  methocarbamol (ROBAXIN) 500 MG tablet  3 times daily     03/14/18 1731    predniSONE (DELTASONE) 10 MG tablet  Daily     03/14/18 1731       Jerrell Mylar 03/14/18 1733    Lorre Nick, MD 03/16/18 [phone]

## 2018-03-14 NOTE — Discharge Instructions (Signed)
Given your symptoms and physical exam findings, I suspect you have either muscular spasm and/or mild nerve inflammation of thoracic back.   We will treat your inflammation with the following medication regimen: Prednisone 40 mg daily x 5 days Methocarbamol (robaxin) 500 mg every 8 hours x 5 days Ibuprofen (advil, motrin) 600 mg + acetaminophen (tylenol) 1000 mg every 8 hours for the next 5 days Heating pad as needed Over the counter lidocaine patches (salonpas) can be helpful  Avoid any exacerbating activities for the next 48 hours.  After 48 hours, start doing light back range of motion exercises and walking to avoid worsening back stiffness.   Return for fevers, chills, chest pain or shortness of breath with exertional, one sided calf swelling or pain, complete or persistent numbness or weakness to your extremities

## 2018-03-14 NOTE — ED Triage Notes (Signed)
Patient presents to ED for assessment of sudden onset of right neck and shoulder pain.  Hx of chronic back pain with stenosis.  Pt denies known injury.

## 2018-03-14 NOTE — ED Notes (Signed)
Patient transported to X-ray 

## 2018-05-17 ENCOUNTER — Ambulatory Visit: Payer: Self-pay | Admitting: Internal Medicine

## 2018-05-23 ENCOUNTER — Emergency Department (HOSPITAL_COMMUNITY)
Admission: EM | Admit: 2018-05-23 | Discharge: 2018-05-23 | Disposition: A | Discharge status: Home / Self Care | Payer: Self-pay | Attending: Emergency Medicine | Admitting: Emergency Medicine

## 2018-05-23 ENCOUNTER — Encounter (HOSPITAL_COMMUNITY): Payer: Self-pay | Admitting: Emergency Medicine

## 2018-05-23 DIAGNOSIS — Y999 Unspecified external cause status: Secondary | ICD-10-CM | POA: Insufficient documentation

## 2018-05-23 DIAGNOSIS — L03114 Cellulitis of left upper limb: Secondary | ICD-10-CM | POA: Insufficient documentation

## 2018-05-23 DIAGNOSIS — Z79899 Other long term (current) drug therapy: Secondary | ICD-10-CM | POA: Insufficient documentation

## 2018-05-23 DIAGNOSIS — F17210 Nicotine dependence, cigarettes, uncomplicated: Secondary | ICD-10-CM | POA: Insufficient documentation

## 2018-05-23 DIAGNOSIS — Y939 Activity, unspecified: Secondary | ICD-10-CM | POA: Insufficient documentation

## 2018-05-23 DIAGNOSIS — Y929 Unspecified place or not applicable: Secondary | ICD-10-CM | POA: Insufficient documentation

## 2018-05-23 DIAGNOSIS — W57XXXA Bitten or stung by nonvenomous insect and other nonvenomous arthropods, initial encounter: Secondary | ICD-10-CM | POA: Insufficient documentation

## 2018-05-23 DIAGNOSIS — J45909 Unspecified asthma, uncomplicated: Secondary | ICD-10-CM | POA: Insufficient documentation

## 2018-05-23 DIAGNOSIS — S40862A Insect bite (nonvenomous) of left upper arm, initial encounter: Secondary | ICD-10-CM | POA: Insufficient documentation

## 2018-05-23 LAB — COMPREHENSIVE METABOLIC PANEL
ALK PHOS: 43 U/L (ref 38–126)
ALT: 12 U/L (ref 0–44)
ANION GAP: 8 (ref 5–15)
AST: 17 U/L (ref 15–41)
Albumin: 3 g/dL — ABNORMAL LOW (ref 3.5–5.0)
BILIRUBIN TOTAL: 0.4 mg/dL (ref 0.3–1.2)
BUN: 11 mg/dL (ref 6–20)
CO2: 24 mmol/L (ref 22–32)
Calcium: 8.7 mg/dL — ABNORMAL LOW (ref 8.9–10.3)
Chloride: 109 mmol/L (ref 98–111)
Creatinine, Ser: 0.87 mg/dL (ref 0.44–1.00)
Glucose, Bld: 154 mg/dL — ABNORMAL HIGH (ref 70–99)
POTASSIUM: 4.2 mmol/L (ref 3.5–5.1)
Sodium: 141 mmol/L (ref 135–145)
TOTAL PROTEIN: 5.4 g/dL — AB (ref 6.5–8.1)

## 2018-05-23 LAB — CBC WITH DIFFERENTIAL/PLATELET
ABS IMMATURE GRANULOCYTES: 0.1 10*3/uL (ref 0.0–0.1)
BASOS ABS: 0.1 10*3/uL (ref 0.0–0.1)
Basophils Relative: 0 %
Eosinophils Absolute: 0.3 10*3/uL (ref 0.0–0.7)
Eosinophils Relative: 3 %
HCT: 39 % (ref 36.0–46.0)
HEMOGLOBIN: 12.7 g/dL (ref 12.0–15.0)
Immature Granulocytes: 1 %
LYMPHS PCT: 18 %
Lymphs Abs: 2.1 10*3/uL (ref 0.7–4.0)
MCH: 31.4 pg (ref 26.0–34.0)
MCHC: 32.6 g/dL (ref 30.0–36.0)
MCV: 96.5 fL (ref 78.0–100.0)
Monocytes Absolute: 0.5 10*3/uL (ref 0.1–1.0)
Monocytes Relative: 5 %
NEUTROS ABS: 8.4 10*3/uL — AB (ref 1.7–7.7)
Neutrophils Relative %: 73 %
Platelets: 201 10*3/uL (ref 150–400)
RBC: 4.04 MIL/uL (ref 3.87–5.11)
RDW: 13.2 % (ref 11.5–15.5)
WBC: 11.6 10*3/uL — AB (ref 4.0–10.5)

## 2018-05-23 LAB — I-STAT CG4 LACTIC ACID, ED
LACTIC ACID, VENOUS: 2.86 mmol/L — AB (ref 0.5–1.9)
Lactic Acid, Venous: 0.65 mmol/L (ref 0.5–1.9)

## 2018-05-23 MED ORDER — CLINDAMYCIN HCL 150 MG PO CAPS: 450.0000 mg | ORAL_CAPSULE | Freq: Three times a day (TID) | ORAL | 0 refills | Status: DC

## 2018-05-23 MED ORDER — ONDANSETRON HCL 4 MG PO TABS
4.0000 mg | ORAL_TABLET | Freq: Once | ORAL | Status: AC
  Administered 2018-05-23: 4 mg via ORAL
  Filled 2018-05-23: qty 1

## 2018-05-23 MED ORDER — CEPHALEXIN 250 MG PO CAPS
500.0000 mg | ORAL_CAPSULE | Freq: Once | ORAL | Status: DC
  Filled 2018-05-23: qty 2

## 2018-05-23 MED ORDER — CLINDAMYCIN HCL 150 MG PO CAPS
300.0000 mg | ORAL_CAPSULE | Freq: Once | ORAL | Status: AC
  Administered 2018-05-23: 300 mg via ORAL
  Filled 2018-05-23: qty 2

## 2018-05-23 MED ORDER — LACTATED RINGERS IV BOLUS
1000.0000 mL | Freq: Once | INTRAVENOUS | Status: AC
  Administered 2018-05-23: 1000 mL via INTRAVENOUS

## 2018-05-23 MED ORDER — CLINDAMYCIN HCL 150 MG PO CAPS: 450.0000 mg | ORAL_CAPSULE | Freq: Three times a day (TID) | ORAL | 0 refills | Status: AC

## 2018-05-23 MED FILL — CLINDAMYCIN HCL 150 MG CAPS: 150 | 7 days supply | Qty: 63 | Fill #0

## 2018-05-23 NOTE — ED Provider Notes (Signed)
[facility] Provider Note   CSN: [id number] Arrival date & time: 05/23/18  1019     History   Chief Complaint Chief Complaint  Patient presents with  . Insect Bite    HPI Kathy Conway is a 42 y.o. female with asthma and migraines who presents due to a bite on her left upper extremity.  She says that 2 days ago, she was walking her dogs when she noticed a red bump on her left upper arm.  Since then, she says that the area has become progressively more red and swollen.  She noticed a small amount of pus draining from it.  Yesterday and today, she has felt nauseous and weak.  She denies having fever, chills, vomiting, chest pain, shortness of breath, difficulty breathing, and diarrhea.  She is not sure what bit her.  HPI  Past Medical History:  Diagnosis Date  . Asthma 10/1993  . Fibromyalgia   . H/O degenerative disc disease   . IBS (irritable bowel syndrome) 10/2003  . Migraine 10/1996  . Renal disorder   . Sciatica   . Vertigo 10/2009    Patient Active Problem List   Diagnosis Date Noted  . 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 Class 3 severe obesity due to excess calories without serious comorbidity with body mass index (BMI) of 40.0 to 44.9 in adult (HCC) 08/06/2017  . History of gestational diabetes 04/17/2017  . Bilateral hearing loss 03/24/2017  . Acute pain of right foot 03/24/2017  . Insomnia 03/05/2017  . Plantar fasciitis, left 10/23/2016  . Chronic bilateral low back pain with bilateral sciatica 09/08/2016  . IBS (irritable bowel syndrome) 05/23/2016  . Asthma   . Boils 03/10/2016  . Anxiety state 03/10/2016  . H/O degenerative disc disease   . Fibromyalgia   . Migraine 10/09/1996    Past Surgical History:  Procedure Laterality Date  . ABDOMINAL HYSTERECTOMY    . CHOLECYSTECTOMY    . TUBAL LIGATION       OB History    Gravida  2   Para  2   Term      Preterm      AB      Living  2     SAB      TAB      Ectopic      Multiple      Live Births               Home Medications    Prior to Admission medications   Medication Sig Start Date End Date Taking? Authorizing Provider  divalproex (DEPAKOTE) 500 MG DR tablet Take 500 mg by mouth 2 (two) times daily.   Yes [provider]  clindamycin (CLEOCIN) 150 MG capsule Take 3 capsules (450 mg total) by mouth 3 (three) times daily for 7 days. 05/23/18 05/30/18  Talitha GivensAshburn, Jenetta Wease, MD  cyclobenzaprine (FLEXERIL) 10 MG tablet Take 1 tablet (10 mg total) by mouth at bedtime. Patient not taking: Reported on 03/14/2018 03/01/17   Dessa PhiFunches, Josalyn, MD  diclofenac (VOLTAREN) 75 MG EC tablet Take 1 tablet (75 mg total) by mouth 2 (two) times daily. Patient not taking: Reported on 12/06/2017 03/22/17   Dessa PhiFunches, Josalyn, MD  DULoxetine (CYMBALTA) 30 MG capsule Take 1 capsule (30 mg total) by mouth daily. Patient not taking: Reported on 05/23/2018 12/06/17   Marcine MatarJohnson, Deborah B, MD  gabapentin (NEURONTIN) 300 MG capsule Take 1 capsule (300 mg total) by mouth 2 (  two) times daily. Patient not taking: Reported on 05/23/2018 06/05/17   Marcine Matar, MD  hydrOXYzine (ATARAX/VISTARIL) 25 MG tablet Take 1 tablet (25 mg total) by mouth at bedtime as needed. Patient not taking: Reported on 05/23/2018 08/06/17   Marcine Matar, MD  ibuprofen (ADVIL,MOTRIN) 600 MG tablet Take 1 tablet (600 mg total) by mouth every 8 (eight) hours as needed. Patient not taking: Reported on 03/14/2018 02/13/18   Marcine Matar, MD  naproxen (NAPROSYN) 375 MG tablet Take 1 tablet (375 mg total) by mouth 2 (two) times daily. Patient not taking: Reported on 03/14/2018 12/25/17   Felicie Morn, NP  SUMAtriptan (IMITREX) 50 MG tablet Take 1 take at start of headache.  May repeat in 2 hrs if no relief.  Max 100 mg/24 hr Patient not taking: Reported on 03/14/2018 02/13/18   Marcine Matar, MD  topiramate (TOPAMAX) 25 MG tablet Take 1 tablet (25 mg total) by mouth at  bedtime. Patient not taking: Reported on 03/14/2018 02/13/18   Marcine Matar, MD    Family History Family History  Problem Relation Age of Onset  . Cancer Mother   . Cancer Father   . Diabetes Maternal Grandfather     Social History Social History   Tobacco Use  . Smoking status: Current Every Day Smoker    Packs/day: 0.50    Types: Cigarettes  . Smokeless tobacco: Never Used  Substance Use Topics  . Alcohol use: No  . Drug use: No     Allergies   Codeine; Iodine; and Latex   Review of Systems Review of Systems Review of Systems   Constitutional  Negative for fever  Negative for chills  HENT  Negative for ear pain  Negative for sore throat  Negative for difficultly swallowing  Eyes  Negative for eye pain  Negative for visual disturbance  Respiratory  Negative for shortness of breath  Negative for cough  CV  Negative for chest pain  Negative for leg swelling  Abdomen  Negative for abdominal pain  +for nausea  Negative for vomiting  MSK  Negative for extremity pain  Negative for back pain  Skin  +for rash  Negative for wound  Neuro  Negative for syncope  Negative for difficultly speaking  Psych  Negative for confusion   The remainder of the ROS was reviewed and negative except as documented above.     Physical Exam Updated Vital Signs BP 107/74 (BP Location: Left Arm)   Pulse 76   Temp 99 F (37.2 C) (Oral)   Resp 15   SpO2 99%   Physical Exam Physical Exam Constitutional  Nursing notes reviewed  Vital signs reviewed  HEENT  No obvious trauma  Supple without meningismus, mass, or overt JVD  EOMI  No scleral icterus or injection  Respiratory  Effort normal  CTAB  No respiratory distress  CV  Normal rate  No obvious murmurs  No pitting edema  Abdomen  Soft  Non-tender  Non-distended  No peritonitis  MSK  Atraumatic  No obvious deformity  ROM appropriate  Skin  Warm  Dry  Single puncture  mark on left upper extremity with 6cm of circumferential erythema; fluctuance at immediate puncture site  Neuro  Awake and alert  EOMI  Moving all extremities  Psychiatric  Mood and affect normal        ED Treatments / Results  Labs (all labs ordered are listed, but only abnormal results are displayed) Labs Reviewed  COMPREHENSIVE METABOLIC PANEL - Abnormal; Notable for the following components:      Result Value   Glucose, Bld 154 (*)    Calcium 8.7 (*)    Total Protein 5.4 (*)    Albumin 3.0 (*)    All other components within normal limits  CBC WITH DIFFERENTIAL/PLATELET - Abnormal; Notable for the following components:   WBC 11.6 (*)    Neutro Abs 8.4 (*)    All other components within normal limits  I-STAT CG4 LACTIC ACID, ED - Abnormal; Notable for the following components:   Lactic Acid, Venous 2.86 (*)    All other components within normal limits  I-STAT CG4 LACTIC ACID, ED    EKG None  Radiology No results found.  Procedures .Marland Kitchen.Incision and Drainage Date/Time: 05/23/2018 5:55 PM Performed by: Talitha GivensAshburn, Treyveon Mochizuki, MD Authorized by: Talitha GivensAshburn, Maggy Wyble, MD   Consent:    Consent obtained:  Verbal   Consent given by:  Patient   Risks discussed:  Bleeding, incomplete drainage and infection   Alternatives discussed:  No treatment Location:    Type:  Abscess   Size:  0.5cm   Location:  Upper extremity   Upper extremity location:  Arm   Arm location:  L upper arm Pre-procedure details:    Skin preparation:  Chloraprep Procedure type:    Complexity:  Simple Procedure details:    Incision types:  Stab incision   Incision depth:  Subcutaneous   Scalpel blade:  11   Wound management:  Probed and deloculated   Drainage:  Bloody and purulent   Drainage amount:  Scant   Wound treatment:  Wound left open   Packing materials:  None Post-procedure details:    Patient tolerance of procedure:  Tolerated well, no immediate complications   (including critical care  time)  Medications Ordered in ED Medications  ondansetron (ZOFRAN) tablet 4 mg (4 mg Oral Given 05/23/18 1154)  lactated ringers bolus 1,000 mL (0 mLs Intravenous Stopped 05/23/18 1310)  clindamycin (CLEOCIN) capsule 300 mg (300 mg Oral Given 05/23/18 1157)     Initial Impression / Assessment and Plan / ED Course  I have reviewed the triage vital signs and the nursing notes.  Pertinent labs & imaging results that were available during my care of the patient were reviewed by me and considered in my medical decision making (see chart for details).     Kathy GoodpastureAndria D Conway presents after a bite to her arm with worsening redness, nausea, and generally feeling unwell as per above.  She is hemodynamically stable and afebrile.  Clinically, she appears to have cellulitis with a small abscess.  This erythema could also represent an urticarial-like lesion.  Bedside ultrasound confirmed a small, nonloculated abscess with tissue cobblestoning around it.  It is not clear what bit her.  There is a single puncture mark.  This is not consistent with a snakebite.  A mosquito bite or bee sting are on the differential.  There are no signs of anaphylaxis.  Triage obtained  labs, which revealed a mild leukocytosis to 11.6.  Her lactic acid was elevated to 2.86.  She was given 1L LR.  Repeat lactic acid had normalized.  She is not septic.  The abscess was drained at bedside.  She was given oral clindamycin in the ED and discharged on this.  I provided ED return precautions and encouraged her to followup with her PCP.   Final Clinical Impressions(s) / ED Diagnoses   Final diagnoses:  Cellulitis of left  upper extremity    ED Discharge Orders         Ordered    clindamycin (CLEOCIN) 150 MG capsule  3 times daily,   Status:  Discontinued     05/23/18 1400    clindamycin (CLEOCIN) 150 MG capsule  3 times daily     05/23/18 1401           Talitha Givens, MD 05/23/18 1756    Loren Racer,  MD 05/24/18 [phone]

## 2018-05-23 NOTE — ED Notes (Signed)
ED Provider at bedside. 

## 2018-05-23 NOTE — ED Triage Notes (Addendum)
Pt reports she thinks she was bitten by something, has small red lesion to left ac with erythema approx 3-4 inches around lesion. States that redness started last night. Pt also endorses some associated nausea and headaches.

## 2018-05-23 NOTE — Discharge Instructions (Addendum)
Vedia PereyraAndria D Levay:  Thank you for allowing us to take care of you today.  We hope you begin feeling better soon.  To-Do: Please take your clindamycin as prescribed You can take benadryl for your itching If you rash worsens (like we discussed) or you develop fever, please return to the Ed. The red margins around the bite will likely get 1-2cm larger over the next 24 hours. Please return to the Emergency Department or call 911 if you experience chest pain, shortness of breath, severe pain, severe fever, altered mental status, or have any reason to think that you need emergency medical care.  Thank you again.  Hope you feel better soon.

## 2018-06-21 ENCOUNTER — Ambulatory Visit: Payer: Self-pay | Attending: Internal Medicine | Admitting: Internal Medicine

## 2018-06-21 ENCOUNTER — Encounter: Payer: Self-pay | Admitting: Internal Medicine

## 2018-06-21 VITALS — BP 121/85 | HR 86 | Temp 98.0°F | Resp 16 | Wt 279.4 lb

## 2018-06-21 DIAGNOSIS — G8929 Other chronic pain: Secondary | ICD-10-CM | POA: Insufficient documentation

## 2018-06-21 DIAGNOSIS — M545 Low back pain: Secondary | ICD-10-CM | POA: Insufficient documentation

## 2018-06-21 DIAGNOSIS — Z90710 Acquired absence of both cervix and uterus: Secondary | ICD-10-CM | POA: Insufficient documentation

## 2018-06-21 DIAGNOSIS — Z9049 Acquired absence of other specified parts of digestive tract: Secondary | ICD-10-CM | POA: Insufficient documentation

## 2018-06-21 DIAGNOSIS — Z6841 Body Mass Index (BMI) 40.0 and over, adult: Secondary | ICD-10-CM | POA: Insufficient documentation

## 2018-06-21 DIAGNOSIS — F17210 Nicotine dependence, cigarettes, uncomplicated: Secondary | ICD-10-CM | POA: Insufficient documentation

## 2018-06-21 DIAGNOSIS — R32 Unspecified urinary incontinence: Secondary | ICD-10-CM

## 2018-06-21 DIAGNOSIS — M797 Fibromyalgia: Secondary | ICD-10-CM | POA: Insufficient documentation

## 2018-06-21 DIAGNOSIS — Z9851 Tubal ligation status: Secondary | ICD-10-CM | POA: Insufficient documentation

## 2018-06-21 DIAGNOSIS — F319 Bipolar disorder, unspecified: Secondary | ICD-10-CM

## 2018-06-21 DIAGNOSIS — M5416 Radiculopathy, lumbar region: Secondary | ICD-10-CM

## 2018-06-21 DIAGNOSIS — Z23 Encounter for immunization: Secondary | ICD-10-CM

## 2018-06-21 DIAGNOSIS — Z79899 Other long term (current) drug therapy: Secondary | ICD-10-CM | POA: Insufficient documentation

## 2018-06-21 DIAGNOSIS — N393 Stress incontinence (female) (male): Secondary | ICD-10-CM | POA: Insufficient documentation

## 2018-06-21 NOTE — Progress Notes (Signed)
Patient ID: Kathy Conway, female    DOB: [birth year]  MRN: [id number]  CC: Follow-up (3 month )   Subjective: Kathy Conway is a 42 y.o. female who presents for follow-up visit. Her concerns today include:  Pt with hx of obesity, chronic LBP, asthma,anxiety, vertigo, mood disorder, tob depand bipolar  [facility] labs drawn for CBC, CMP, TSH, folate and Depakote level.  They requests that the results be faxed to them.  She has a sheet with her with the fax number on it.  She recently had CBC and complete metabolic panel done on an ER visit last month.    She continues to complain of chronic lower back pain that radiates to the legs with tingling down the side of both legs.  She feels the leg get weak at times.  Some intermittent numbness on the dorsal surface of the feet.  Over the past few weeks she has noticed some urinary incontinence where she does not feel the urge to urinate.  She can also have leakage of urine with coughing or laughing.  She has also noted a part of the groin area is numb to touch.  Bowel function is okay.  Patient Active Problem List   Diagnosis Date Noted  . 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 Class 3 severe obesity due to excess calories without serious comorbidity with body mass index (BMI) of 40.0 to 44.9 in adult (HCC) 08/06/2017  . History of gestational diabetes 04/17/2017  . Bilateral hearing loss 03/24/2017  . Acute pain of right foot 03/24/2017  . Insomnia 03/05/2017  . Plantar fasciitis, left 10/23/2016  . Chronic bilateral low back pain with bilateral sciatica 09/08/2016  . IBS (irritable bowel syndrome) 05/23/2016  . Asthma   . Boils 03/10/2016  . Anxiety state 03/10/2016  . H/O degenerative disc disease   . Fibromyalgia   . Migraine 10/09/1996     Current Outpatient Medications on File Prior to Visit  Medication Sig Dispense Refill  . divalproex (DEPAKOTE) 500 MG DR tablet Take 500 mg  by mouth 3 (three) times daily.     Marland Kitchen. ibuprofen (ADVIL,MOTRIN) 600 MG tablet Take 1 tablet (600 mg total) by mouth every 8 (eight) hours as needed. 60 tablet 0  . traZODone (DESYREL) 150 MG tablet Take by mouth at bedtime. 1-2 tablets at bedtime prn    . cyclobenzaprine (FLEXERIL) 10 MG tablet Take 1 tablet (10 mg total) by mouth at bedtime. (Patient not taking: Reported on 03/14/2018) 30 tablet 2  . diclofenac (VOLTAREN) 75 MG EC tablet Take 1 tablet (75 mg total) by mouth 2 (two) times daily. (Patient not taking: Reported on 12/06/2017) 30 tablet 0  . DULoxetine (CYMBALTA) 30 MG capsule Take 1 capsule (30 mg total) by mouth daily. (Patient not taking: Reported on 05/23/2018) 30 capsule 3  . gabapentin (NEURONTIN) 300 MG capsule Take 1 capsule (300 mg total) by mouth 2 (two) times daily. (Patient not taking: Reported on 05/23/2018) 60 capsule 3  . hydrOXYzine (ATARAX/VISTARIL) 25 MG tablet Take 1 tablet (25 mg total) by mouth at bedtime as needed. (Patient not taking: Reported on 05/23/2018) 30 tablet 2  . naproxen (NAPROSYN) 375 MG tablet Take 1 tablet (375 mg total) by mouth 2 (two) times daily. (Patient not taking: Reported on 03/14/2018) 20 tablet 0  . SUMAtriptan (IMITREX) 50 MG tablet Take 1 take at start of headache.  May repeat in 2 hrs if  no relief.  Max 100 mg/24 hr (Patient not taking: Reported on 03/14/2018) 10 tablet 1  . topiramate (TOPAMAX) 25 MG tablet Take 1 tablet (25 mg total) by mouth at bedtime. (Patient not taking: Reported on 03/14/2018) 30 tablet 4   No current facility-administered medications on file prior to visit.     Allergies  Allergen Reactions  . Codeine Shortness Of Breath  . Iodine     Hives  . Latex Itching, Swelling and Rash    Social History   Socioeconomic History  . Marital status: Single    Spouse name: Not on file  . Number of children: Not on file  . Years of education: Not on file  . Highest education level: Not on file  Occupational History  . Not on  file  Social Needs  . Financial resource strain: Not on file  . Food insecurity:    Worry: Not on file    Inability: Not on file  . Transportation needs:    Medical: Not on file    Non-medical: Not on file  Tobacco Use  . Smoking status: Current Every Day Smoker    Packs/day: 0.50    Types: Cigarettes  . Smokeless tobacco: Never Used  Substance and Sexual Activity  . Alcohol use: No  . Drug use: No  . Sexual activity: Yes    Birth control/protection: Surgical  Lifestyle  . Physical activity:    Days per week: Not on file    Minutes per session: Not on file  . Stress: Not on file  Relationships  . Social connections:    Talks on phone: Not on file    Gets together: Not on file    Attends religious service: Not on file    Active member of club or organization: Not on file    Attends meetings of clubs or organizations: Not on file    Relationship status: Not on file  . Intimate partner violence:    Fear of current or ex partner: Not on file    Emotionally abused: Not on file    Physically abused: Not on file    Forced sexual activity: Not on file  Other Topics Concern  . Not on file  Social History Narrative  . Not on file    Family History  Problem Relation Age of Onset  . Cancer Mother   . Cancer Father   . Diabetes Maternal Grandfather     Past Surgical History:  Procedure Laterality Date  . ABDOMINAL HYSTERECTOMY    . CHOLECYSTECTOMY    . TUBAL LIGATION      ROS: Review of Systems Negative except as above. PHYSICAL EXAM: BP 121/85   Pulse 86   Temp 98 F (36.7 C) (Oral)   Resp 16   Wt 279 lb 6.4 oz (126.7 kg)   SpO2 96%   BMI 45.10 kg/m   Physical Exam  General appearance - alert, well appearing, and in no distress Mental status - normal mood, behavior, speech, dress, motor activity, and thought processes Neurological -power lower extremities 5/5 bilaterally proximal and distally.  Gross sensation intact.  Gait is normal.  Normal  reflexes. MSK: Mild tenderness on palpation of the lumbar spine.  ASSESSMENT AND PLAN: 1. Need for immunization against influenza - Flu Vaccine QUAD 36+ mos IM  2. Bipolar 1 disorder (HCC) - TSH - Valproic acid level - Folate  3. Lumbar radiculopathy Given her current symptoms I think we need to proceed with an MRI  as soon as possible.  Patient is agreeable to this. - MR Lumbar Spine Wo Contrast; Future  4. Urinary incontinence, unspecified type See #3 above.  She has a component of stress incontinence but at this time am more concerned about the incontinence that she is having without feeling the urge to urinate.  Further management will be based on results of the lumbar MRI.   Patient was given the opportunity to ask questions.  Patient verbalized understanding of the plan and was able to repeat key elements of the plan.   Orders Placed This Encounter  Procedures  . Flu Vaccine QUAD 36+ mos IM     Requested Prescriptions    No prescriptions requested or ordered in this encounter    No follow-ups on file.  Jonah Blue, MD, FACP

## 2018-06-21 NOTE — Progress Notes (Signed)
Pt states the past 2 days her left leg has been swollen

## 2018-06-21 NOTE — Patient Instructions (Addendum)
Follow a Healthy Eating Plan - You can do it! Limit sugary drinks.  Avoid sodas, sweet tea, sport or energy drinks, or fruit drinks.  Drink water, lo-fat milk, or diet drinks. Limit snack foods.   Cut back on candy, cake, cookies, chips, ice cream.  These are a special treat, only in small amounts. Eat plenty of vegetables.  Especially dark green, red, and orange vegetables. Aim for at least 3 servings a day. More is better! Include fruit in your daily diet.  Whole fruit is much healthier than fruit juice! Limit "white" bread, "white" pasta, "white" rice.   Choose "100% whole grain" products, brown or wild rice. Avoid fatty meats. Try "Meatless Monday" and choose eggs or beans one day a week.  When eating meat, choose lean meats like chicken, turkey, and fish.  Grill, broil, or bake meats instead of frying, and eat poultry without the skin. Eat less salt.  Avoid frozen pizzas, frozen dinners and salty foods.  Use seasonings other than salt in cooking.  This can help blood pressure and keep you from swelling Beer, wine and liquor have calories.  If you can safely drink alcohol, limit to 1 drink per day for women, 2 drinks for men   Influenza Virus Vaccine injection (Fluarix) What is this medicine? INFLUENZA VIRUS VACCINE (in floo EN zuh VAHY ruhs vak SEEN) helps to reduce the risk of getting influenza also known as the flu. This medicine may be used for other purposes; ask your health care provider or pharmacist if you have questions. COMMON BRAND NAME(S): Fluarix, Fluzone What should I tell my health care provider before I take this medicine? They need to know if you have any of these conditions: -bleeding disorder like hemophilia -fever or infection -Guillain-Barre syndrome or other neurological problems -immune system problems -infection with the human immunodeficiency virus (HIV) or AIDS -low blood platelet counts -multiple sclerosis -an unusual or allergic reaction to influenza virus  vaccine, eggs, chicken proteins, latex, gentamicin, other medicines, foods, dyes or preservatives -pregnant or trying to get pregnant -breast-feeding How should I use this medicine? This vaccine is for injection into a muscle. It is given by a health care professional. A copy of Vaccine Information Statements will be given before each vaccination. Read this sheet carefully each time. The sheet may change frequently. Talk to your pediatrician regarding the use of this medicine in children. Special care may be needed. Overdosage: If you think you have taken too much of this medicine contact a poison control center or emergency room at once. NOTE: This medicine is only for you. Do not share this medicine with others. What if I miss a dose? This does not apply. What may interact with this medicine? -chemotherapy or radiation therapy -medicines that lower your immune system like etanercept, anakinra, infliximab, and adalimumab -medicines that treat or prevent blood clots like warfarin -phenytoin -steroid medicines like prednisone or cortisone -theophylline -vaccines This list may not describe all possible interactions. Give your health care provider a list of all the medicines, herbs, non-prescription drugs, or dietary supplements you use. Also tell them if you smoke, drink alcohol, or use illegal drugs. Some items may interact with your medicine. What should I watch for while using this medicine? Report any side effects that do not go away within 3 days to your doctor or health care professional. Call your health care provider if any unusual symptoms occur within 6 weeks of receiving this vaccine. You may still catch the flu, but the   illness is not usually as bad. You cannot get the flu from the vaccine. The vaccine will not protect against colds or other illnesses that may cause fever. The vaccine is needed every year. What side effects may I notice from receiving this medicine? Side effects  that you should report to your doctor or health care professional as soon as possible: -allergic reactions like skin rash, itching or hives, swelling of the face, lips, or tongue Side effects that usually do not require medical attention (report to your doctor or health care professional if they continue or are bothersome): -fever -headache -muscle aches and pains -pain, tenderness, redness, or swelling at site where injected -weak or tired This list may not describe all possible side effects. Call your doctor for medical advice about side effects. You may report side effects to FDA at 1-800-FDA-1088. Where should I keep my medicine? This vaccine is only given in a clinic, pharmacy, doctor's office, or other health care setting and will not be stored at home. NOTE: This sheet is a summary. It may not cover all possible information. If you have questions about this medicine, talk to your doctor, pharmacist, or health care provider.  2018 Elsevier/Gold Standard (2008-04-22 09:30:40)  

## 2018-06-22 LAB — FOLATE: Folate: 12.7 ng/mL (ref >3.0)

## 2018-06-22 LAB — VALPROIC ACID LEVEL: Valproic Acid Lvl: 24 ug/mL — ABNORMAL LOW (ref 50–100)

## 2018-06-22 LAB — TSH: TSH: 2.46 u[IU]/mL (ref 0.450–4.500)

## 2018-07-03 ENCOUNTER — Ambulatory Visit (HOSPITAL_COMMUNITY)
Admission: RE | Admit: 2018-07-03 | Discharge: 2018-07-03 | Disposition: A | Discharge status: Home / Self Care | Payer: Self-pay | Source: Ambulatory Visit | Attending: Internal Medicine | Admitting: Internal Medicine

## 2018-07-03 DIAGNOSIS — M5416 Radiculopathy, lumbar region: Secondary | ICD-10-CM | POA: Insufficient documentation

## 2018-07-03 DIAGNOSIS — M8938 Hypertrophy of bone, other site: Secondary | ICD-10-CM | POA: Insufficient documentation

## 2018-08-02 ENCOUNTER — Ambulatory Visit: Payer: Self-pay | Admitting: Internal Medicine

## 2018-08-06 ENCOUNTER — Ambulatory Visit: Payer: Self-pay | Attending: Internal Medicine | Admitting: Internal Medicine

## 2018-08-06 ENCOUNTER — Encounter: Payer: Self-pay | Admitting: Internal Medicine

## 2018-08-06 VITALS — BP 122/84 | HR 89 | Temp 98.0°F | Resp 16 | Wt 283.6 lb

## 2018-08-06 DIAGNOSIS — G8929 Other chronic pain: Secondary | ICD-10-CM | POA: Insufficient documentation

## 2018-08-06 DIAGNOSIS — M5416 Radiculopathy, lumbar region: Secondary | ICD-10-CM | POA: Insufficient documentation

## 2018-08-06 DIAGNOSIS — Z6841 Body Mass Index (BMI) 40.0 and over, adult: Secondary | ICD-10-CM | POA: Insufficient documentation

## 2018-08-06 NOTE — Progress Notes (Signed)
Patient ID: Kathy Conway, female    DOB: May 15, 1976  MRN: [id number]  CC: Follow-up (6 week)   Subjective: Kathy Conway is a 42 y.o. female who presents for follow-up on chronic lower back pain. Her concerns today include:  Pt with hx of obesity, chronic LBP, asthma,anxiety, vertigo, mood disorder, tob depand bipolar  On last visit patient complained of continued chronic lower back pain that radiated into the legs with tingling down the side of both legs.  She felt that the legs were weak at times and had noted some urinary incontinence with saddle anesthesia.  MRI was ordered. -I went over results of MRI with her today.  It did not reveal any slipped disc or nerve compression.  It did show some mild to moderate bilateral facet hypertrophy. -She has not applied for the orange card as yet because she does not have some of the supporting document that is required.  She was hoping to be referred to pain management.  Obesity: -reports that she does a lot of walking during the day.  However, "I stress eat."  She worries about her bills.  Husband works 2 jobs "and we still barely get by."  She has applied for disability based on behavioral health diagnoses  HM: Saw on Mychart that she needs to have MMG Fhx of breast cancer in maternal GM.  Patient Active Problem List   Diagnosis Date Noted  . 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 Class 3 severe obesity due to excess calories without serious comorbidity with body mass index (BMI) of 40.0 to 44.9 in adult (HCC) 08/06/2017  . History of gestational diabetes 04/17/2017  . Bilateral hearing loss 03/24/2017  . Acute pain of right foot 03/24/2017  . Insomnia 03/05/2017  . Plantar fasciitis, left 10/23/2016  . Chronic bilateral low back pain with bilateral sciatica 09/08/2016  . IBS (irritable bowel syndrome) 05/23/2016  . Asthma   . Boils 03/10/2016  . Anxiety state 03/10/2016  . H/O degenerative disc disease   . Fibromyalgia     . Migraine 10/09/1996     Current Outpatient Medications on File Prior to Visit  Medication Sig Dispense Refill  . cariprazine (VRAYLAR) capsule Take by mouth.    . diclofenac (VOLTAREN) 75 MG EC tablet Take 1 tablet (75 mg total) by mouth 2 (two) times daily. (Patient not taking: Reported on 12/06/2017) 30 tablet 0  . divalproex (DEPAKOTE) 500 MG DR tablet Take 500 mg by mouth 3 (three) times daily.     . DULoxetine (CYMBALTA) 30 MG capsule Take 1 capsule (30 mg total) by mouth daily. (Patient not taking: Reported on 05/23/2018) 30 capsule 3  . ibuprofen (ADVIL,MOTRIN) 600 MG tablet Take 1 tablet (600 mg total) by mouth every 8 (eight) hours as needed. 60 tablet 0  . SUMAtriptan (IMITREX) 50 MG tablet Take 1 take at start of headache.  May repeat in 2 hrs if no relief.  Max 100 mg/24 hr (Patient not taking: Reported on 03/14/2018) 10 tablet 1  . traZODone (DESYREL) 150 MG tablet Take by mouth at bedtime. 1-2 tablets at bedtime prn     No current facility-administered medications on file prior to visit.     Allergies  Allergen Reactions  . Codeine Shortness Of Breath  . Iodine     Hives  . Latex Itching, Swelling and Rash    Social History   Socioeconomic History  . Marital status: Married    Spouse name: Not on file  .  Number of children: Not on file  . Years of education: Not on file  . Highest education level: Not on file  Occupational History  . Not on file  Social Needs  . Financial resource strain: Not on file  . Food insecurity:    Worry: Not on file    Inability: Not on file  . Transportation needs:    Medical: Not on file    Non-medical: Not on file  Tobacco Use  . Smoking status: Current Every Day Smoker    Packs/day: 0.50    Types: Cigarettes  . Smokeless tobacco: Never Used  Substance and Sexual Activity  . Alcohol use: No  . Drug use: No  . Sexual activity: Yes    Birth control/protection: Surgical  Lifestyle  . Physical activity:    Days per week: Not  on file    Minutes per session: Not on file  . Stress: Not on file  Relationships  . Social connections:    Talks on phone: Not on file    Gets together: Not on file    Attends religious service: Not on file    Active member of club or organization: Not on file    Attends meetings of clubs or organizations: Not on file    Relationship status: Not on file  . Intimate partner violence:    Fear of current or ex partner: Not on file    Emotionally abused: Not on file    Physically abused: Not on file    Forced sexual activity: Not on file  Other Topics Concern  . Not on file  Social History Narrative  . Not on file    Family History  Problem Relation Age of Onset  . Cancer Mother   . Cancer Father   . Diabetes Maternal Grandfather     Past Surgical History:  Procedure Laterality Date  . ABDOMINAL HYSTERECTOMY    . CHOLECYSTECTOMY    . TUBAL LIGATION      ROS: Review of Systems Negative except as above.  PHYSICAL EXAM: BP 122/84   Pulse 89   Temp 98 F (36.7 C) (Oral)   Resp 16   Wt 283 lb 9.6 oz (128.6 kg)   SpO2 96%   BMI 45.77 kg/m   Wt Readings from Last 3 Encounters:  08/06/18 283 lb 9.6 oz (128.6 kg)  06/21/18 279 lb 6.4 oz (126.7 kg)  03/14/18 278 lb (126.1 kg)    Physical Exam  General appearance - alert, well appearing, and in no distress Mental status - normal mood, behavior, speech, dress, motor activity, and thought processes   ASSESSMENT AND PLAN:  1. Lumbar radiculopathy MRI findings does not explain the symptoms that she reports.  In any event I do not think she needs pain management.  I have recommended some physical therapy but she does not have the orange card or cone discount.  Patient states that she can go to the [facility] with her friend a few times a week to do water aerobics.  2. Class 3 severe obesity due to excess calories without serious comorbidity with body mass index (BMI) of 45.0 to 49.9 in adult [facility]) Healthy eating habits and  regular exercise encouraged.  HM: In regards to mammogram, we discussed some of the recommendations out there regarding breast cancer screening.  Some recommend starting at age 31 and doing mammogram every 2 years while others recommend starting at age 46 but with shared decision making.  I discussed these options  with patient.  She wants to hold off until she is a little older before having her first mammogram.  Patient was given the opportunity to ask questions.  Patient verbalized understanding of the plan and was able to repeat key elements of the plan.   No orders of the defined types were placed in this encounter.    Requested Prescriptions    No prescriptions requested or ordered in this encounter    No follow-ups on file.  Jonah Blue, MD, FACP

## 2018-08-06 NOTE — Progress Notes (Signed)
Pt states her pain from her back radiates down to both legs  Pt was taken off Depakote and was switched to Lithium but pt doesn't know the dosage and pt has not started the medication yet

## 2018-08-06 NOTE — Patient Instructions (Signed)

## 2018-09-03 ENCOUNTER — Emergency Department (HOSPITAL_COMMUNITY)
Admission: EM | Admit: 2018-09-03 | Discharge: 2018-09-03 | Disposition: A | Discharge status: Home / Self Care | Payer: Self-pay | Attending: Emergency Medicine | Admitting: Emergency Medicine

## 2018-09-03 ENCOUNTER — Encounter (HOSPITAL_COMMUNITY): Payer: Self-pay

## 2018-09-03 ENCOUNTER — Emergency Department (HOSPITAL_COMMUNITY): Payer: Self-pay

## 2018-09-03 DIAGNOSIS — Z79899 Other long term (current) drug therapy: Secondary | ICD-10-CM | POA: Insufficient documentation

## 2018-09-03 DIAGNOSIS — Z91040 Latex allergy status: Secondary | ICD-10-CM | POA: Insufficient documentation

## 2018-09-03 DIAGNOSIS — M25561 Pain in right knee: Secondary | ICD-10-CM | POA: Insufficient documentation

## 2018-09-03 DIAGNOSIS — J45909 Unspecified asthma, uncomplicated: Secondary | ICD-10-CM | POA: Insufficient documentation

## 2018-09-03 DIAGNOSIS — F17210 Nicotine dependence, cigarettes, uncomplicated: Secondary | ICD-10-CM | POA: Insufficient documentation

## 2018-09-03 MED ORDER — ACETAMINOPHEN 325 MG PO TABS
650.0000 mg | ORAL_TABLET | Freq: Once | ORAL | Status: AC
  Administered 2018-09-03: 650 mg via ORAL
  Filled 2018-09-03: qty 2

## 2018-09-03 MED ORDER — MELOXICAM 15 MG PO TABS: 15.0000 mg | ORAL_TABLET | Freq: Every day | ORAL | 0 refills | Status: DC

## 2018-09-03 MED ORDER — METHOCARBAMOL 500 MG PO TABS: 500.0000 mg | ORAL_TABLET | Freq: Every evening | ORAL | 0 refills | Status: DC | PRN

## 2018-09-03 MED FILL — METHOCARBAMOL 500 MG TABS: 500 | 20 days supply | Qty: 20 | Fill #0

## 2018-09-03 MED FILL — MELOXICAM 15 MG TABLET: 15 | 30 days supply | Qty: 30 | Fill #0

## 2018-09-03 NOTE — Discharge Instructions (Addendum)
Please read and follow all provided instructions.  Your diagnoses today include: Knee Pain   Tests performed today include: X-rays of the affected joint. This did not show a fracture or dislocation Vital signs. See below for your results today.   Be sure to read and understand instructions below prior to leaving the hospital. If your symptoms persist without any improvement in 1 week it is recommended that you follow up with the orthopedics listed above. Use your pain medication as prescribed.  Knee Effusion  The medical term for having fluid in your knee is effusion. This means something is wrong inside the knee. Some of the causes of fluid in the knee may be torn cartilage, a torn ligament, or bleeding into the joint from an injury. Small tears may heal on their own with conservative treatment. Conservative means rest, limited weight bearing activity and muscle strengthening exercises. Your recovery may take up to 6 weeks. Larger tears may require surgery.   TREATMENT  Rest, ice, compression and elevation (RICE therapy) are the basic modes of treatment.   Rest is needed to allow your body to heal. Routine activities can be resumed when comfortable (as described above).  Ice: Ice or gel packs can be used to reduce both pain and swelling. Ice is the most helpful within the first 24 to 48 hours after an injury or flareup from overusing a muscle or joint.  Ice is effective, has very few side effects, and is safe for most people to use. Place a dry or damp towel between the ice and skin. A damp towel will cool the skin more quickly, so you may need to shorten the time that the ice is used. For a more rapid response, add gentle compression to the ice. Ice for no more than 10 to 20 minutes at a time. The bonier the area you are icing, the less time it will take to get the benefits of ice. Check your skin after 5 minutes to make sure there are no signs of a poor response to cold or skin damage. Rest 20  minutes or more in between uses. Once your skin is numb, you can end your treatment. You can test numbness by very lightly touching your skin. The touch should be so light that you do not see the skin dimple from the pressure of your fingertip. When using ice, most people will feel these normal sensations in this order: cold, burning, aching, and numbness. Do not use ice on someone who cannot communicate their responses to pain, such as small children or people with dementia.  If you expose your skin to cold temperatures for too long or without the proper protection, you can damage your skin or nerves. Watch for signs of skin damage due to cold.  Compression: this helps keep swelling down. It also gives support and helps with discomfort. If any lasting bandage has been applied, it should be removed and reapplied every 3-4 hours. It should not be applied tightly, but firmly enough to keep swelling down. Watch fingers or toes for swelling, discoloration, coldness, numbness or excessive pain. If any of these problems occur, removed the bandage and reapply loosely. Contact your caregiver if these problems continue. If you were given an knee imobilizer you may take it off at night and to take a shower or bath. Wiggle your toes in the splint several times per day if you are able.  Elevation helps reduce swelling and decrease your pain. With extremities such as the  arms, hands, legs and feet, the injured area should be placed near or above the level of the heart if possible (place pillows underneath you leg/foot while you sleep to achieve this). If your caregiver recommends crutches, use them as instructed (for up to) 1 week.  Do not drive a vehicle on pain medication. ACTIVITY:            - Weight bearing as tolerated            - Exercises should be limited to pain free range of motion  Knee Brace:: This is used to support and protect an injured or painful knee. Knee immobilizers keep your knee from being used  while it is healing.  Use powder to control irritation from sweat and friction.  Adjust the immobilizer to be firm but not tight. Signs of an immobilizer that is too tight include:   Swelling.   Numbness.   Color change in your foot or ankle.   Increased pain.  While resting, raise your leg above the level of your heart. This reduces throbbing and helps healing. Prop it up with pillows.  Remove the immobilizer to bathe and sleep. Wear it other times until you see your doctor again.               SEEK MEDICAL CARE IF:  You have an increase in bruising, swelling, or pain.  Your toes feel cold.  Pain relief is not achieved with medications.  EMERGENCY:: Your toes are numb or blue or you have severe pain.  You notice redness, swelling, warmth or increasing pain in your knee.  An unexplained oral temperature above 102 F (38.9 C) develops.  HOW TO MAKE AN ICE PACK  To make an ice pack, do one of the following:  Place crushed ice or a bag of frozen vegetables in a sealable plastic bag. Squeeze out the excess air. Place this bag inside another plastic bag. Slide the bag into a pillowcase or place a damp towel between your skin and the bag.  Mix 3 parts water with 1 part rubbing alcohol. Freeze the mixture in a sealable plastic bag. When you remove the mixture from the freezer, it will be slushy. Squeeze out the excess air. Place this bag inside another plastic bag. Slide the bag into a pillowcase or place a damp towel between your skin.   Muscle relaxants:  These medications can help with muscle tightness that is a cause of lower back pain.  Most of these medications can cause drowsiness, and it is not safe to drive or use dangerous machinery while taking them. They are primarily helpful when taken at night before sleep.  Please take mobic with food. Do not combine with ibuprofen, aleve, naproxen, naprosyn, aspirin or other nsaids.   Additional Information:  Your vital signs today were: BP  (!) 148/95    Pulse (!) 114    Temp 98.3 F (36.8 C) (Oral)    Resp 16    SpO2 96%  If your blood pressure (BP) was elevated above 135/85 this visit, please have this repeated by your doctor within one month. ---------------

## 2018-09-03 NOTE — ED Triage Notes (Signed)
Pt presents for evaluation of R knee pain x 2 days. Pt reports was moving a king size mattress when her knee "buckled." reports radiating pain to lower leg and hip. Pt is ambulatory, reports has been icing and using OTC pain relieves but knee continues to swell.

## 2018-09-03 NOTE — ED Notes (Signed)
Pt in room

## 2018-09-03 NOTE — ED Notes (Signed)
Patient verbalizes understanding of discharge instructions. Opportunity for questioning and answers were provided. Armband removed by staff, pt discharged from ED ambulatory to home.  

## 2018-09-03 NOTE — ED Provider Notes (Signed)
[facility]   CSN: [id number] Arrival date & time: 09/03/18  1421     History   Chief Complaint Chief Complaint  Patient presents with  . Knee Pain    HPI Carilyn Goodpasturendria D Kenneth is a 42 y.o. female with past medical history as below who presents emergency department today for knee pain x2 days.  Patient reports that she was trying to move a king-size mattress with her knee prior to the injury.  She reports that she stood lateral to the bed and tried to shave her knee into this when the patella of the knee felt like it buckled.  Since then she has been having pain on the anterior aspect of her knee occasionally shoots downwards to her lower leg.  She reports she has been able to ambulate on this but has been having some pain.  She notes she has iced this on one occasion.  She is been trying ibuprofen and Aleve for her symptoms with moderate relief but notes it is difficult to keep up taking this throughout the day.  The patient denies any fall that led to head injury.  She denies any hip pain or lower extremity pain.  Her pain is worse with range of motion and ambulation.  She denies prior injury or surgeries to the knee.  She denies any open wounds.  No numbness/tingling/weakness.  HPI  Past Medical History:  Diagnosis Date  . Asthma 10/1993  . Fibromyalgia   . H/O degenerative disc disease   . IBS (irritable bowel syndrome) 10/2003  . Migraine 10/1996  . Renal disorder   . Sciatica   . Vertigo 10/2009    Patient Active Problem List   Diagnosis Date Noted  . 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 Class 3 severe obesity due to excess calories without serious comorbidity with body mass index (BMI) of 40.0 to 44.9 in adult (HCC) 08/06/2017  . History of gestational diabetes 04/17/2017  . Bilateral hearing loss 03/24/2017  . Acute pain of right foot 03/24/2017  . Insomnia 03/05/2017  . Plantar fasciitis, left 10/23/2016  .  Chronic bilateral low back pain with bilateral sciatica 09/08/2016  . IBS (irritable bowel syndrome) 05/23/2016  . Asthma   . Boils 03/10/2016  . Anxiety state 03/10/2016  . H/O degenerative disc disease   . Fibromyalgia   . Migraine 10/09/1996    Past Surgical History:  Procedure Laterality Date  . ABDOMINAL HYSTERECTOMY    . CHOLECYSTECTOMY    . TUBAL LIGATION       OB History    Gravida  2   Para  2   Term      Preterm      AB      Living  2     SAB      TAB      Ectopic      Multiple      Live Births               Home Medications    Prior to Admission medications   Medication Sig Start Date End Date Taking? Authorizing Provider  cariprazine (VRAYLAR) capsule Take by mouth.    [provider]  diclofenac (VOLTAREN) 75 MG EC tablet Take 1 tablet (75 mg total) by mouth 2 (two) times daily. Patient not taking: Reported on 12/06/2017 03/22/17   Dessa PhiFunches, Josalyn, MD  divalproex (DEPAKOTE) 500 MG DR tablet Take 500 mg by mouth 3 (three)  times daily.     [provider]  DULoxetine (CYMBALTA) 30 MG capsule Take 1 capsule (30 mg total) by mouth daily. Patient not taking: Reported on 05/23/2018 12/06/17   Marcine Matar, MD  ibuprofen (ADVIL,MOTRIN) 600 MG tablet Take 1 tablet (600 mg total) by mouth every 8 (eight) hours as needed. 02/13/18   Marcine Matar, MD  SUMAtriptan (IMITREX) 50 MG tablet Take 1 take at start of headache.  May repeat in 2 hrs if no relief.  Max 100 mg/24 hr Patient not taking: Reported on 03/14/2018 02/13/18   Marcine Matar, MD  traZODone (DESYREL) 150 MG tablet Take by mouth at bedtime. 1-2 tablets at bedtime prn    [provider]    Family History Family History  Problem Relation Age of Onset  . Cancer Mother   . Cancer Father   . Diabetes Maternal Grandfather     Social History Social History   Tobacco Use  . Smoking status: Current Every Day Smoker    Packs/day: 0.50    Types:  Cigarettes  . Smokeless tobacco: Never Used  Substance Use Topics  . Alcohol use: No  . Drug use: No     Allergies   Codeine; Iodine; and Latex   Review of Systems Review of Systems  Constitutional: Negative for fever.  Musculoskeletal: Positive for arthralgias. Negative for gait problem.  Skin: Negative for color change and wound.  Neurological: Negative for weakness and numbness.     Physical Exam Updated Vital Signs BP (!) 148/95   Pulse (!) 114   Temp 98.3 F (36.8 C) (Oral)   Resp 16   SpO2 96%   Physical Exam  Constitutional: She appears well-developed and well-nourished.  HENT:  Head: Normocephalic and atraumatic.  Right Ear: External ear normal.  Left Ear: External ear normal.  Eyes: Conjunctivae are normal. Right eye exhibits no discharge. Left eye exhibits no discharge. No scleral icterus.  Cardiovascular:  Pulses:      Dorsalis pedis pulses are 2+ on the right side, and 2+ on the left side.       Posterior tibial pulses are 2+ on the right side, and 2+ on the left side.  No lower extremity edema or swelling.   Pulmonary/Chest: Effort normal. No respiratory distress.  Musculoskeletal:       Right hip: She exhibits normal range of motion and no tenderness.       Right ankle: She exhibits normal range of motion. No tenderness. Achilles tendon normal.       Right lower leg: Normal.  Right knee: Appearance normal. No obvious deformity. Possible small effusion. No erythema, heat, fluctuance or break of the skin. TTP over patella. Active and passive flexion and extension  intact but with pain at maximal flexion. No crepitus. Negative Lachman's test.  No varus or valgus laxity or locking. No TTP of hips or ankles. Compartments soft. Neurovascularly intact distally to site of injury.  Neurological: She is alert. She has normal strength. No sensory deficit.  Skin: Skin is warm, dry and intact. Capillary refill takes less than 2 seconds. No rash noted. No erythema. No  pallor.  Psychiatric: She has a normal mood and affect.  Nursing note and vitals reviewed.    ED Treatments / Results  Labs (all labs ordered are listed, but only abnormal results are displayed) Labs Reviewed - No data to display  EKG None  Radiology Dg Knee Complete 4 Views Right  Result Date: 09/03/2018 CLINICAL  DATA:  Right knee pain for 2 days since the patient's knee buckled while moving a mattress. Initial encounter. EXAM: RIGHT KNEE - COMPLETE 4+ VIEW COMPARISON:  Plain films right knee 03/21/2009. FINDINGS: No evidence of fracture, dislocation, or joint effusion. No evidence of arthropathy or other focal bone abnormality. Soft tissues are unremarkable. IMPRESSION: Negative exam. Electronically Signed   By: Drusilla Kanner M.D.   On: 09/03/2018 15:16    Procedures Procedures (including critical care time)  Medications Ordered in ED Medications - No data to display   Initial Impression / Assessment and Plan / ED Course  I have reviewed the triage vital signs and the nursing notes.  Pertinent labs & imaging results that were available during my care of the patient were reviewed by me and considered in my medical decision making (see chart for details).     42 y.o. female presenting for right knee pain after pushing a king-size mattress with her right knee.  No evidence of injury to joint above or below.  She is neurovascular intact.  There is no open wounds.  No evidence of infection. Patient X-Ray negative for obvious fracture or dislocation. Pain managed in ED. Pt advised to follow up with orthopedics if symptoms persist for possibility of missed fracture diagnosis. Patient given brace while in ED, conservative therapy recommended and discussed.  Patient reports some relief with ibuprofen at home however has a hard time keeping up with taking every 6 hours.  Will give Mobic.  Patient denies any history of GI bleed in the past.  Last creatinine was within normal limits.   Patient will be dc home & is agreeable with above plan.  Return precautions discussed.  Final Clinical Impressions(s) / ED Diagnoses   Final diagnoses:  Acute pain of right knee    ED Discharge Orders    None       Jacinto Halim, PA-C 09/03/18 1636    Melene Plan, DO 09/03/18 [phone]

## 2018-10-16 ENCOUNTER — Encounter (HOSPITAL_COMMUNITY): Payer: Self-pay | Admitting: *Deleted

## 2018-10-16 ENCOUNTER — Emergency Department (HOSPITAL_COMMUNITY)
Admission: EM | Admit: 2018-10-16 | Discharge: 2018-10-16 | Disposition: A | Discharge status: Home / Self Care | Payer: Self-pay | Attending: Emergency Medicine | Admitting: Emergency Medicine

## 2018-10-16 DIAGNOSIS — J45909 Unspecified asthma, uncomplicated: Secondary | ICD-10-CM | POA: Insufficient documentation

## 2018-10-16 DIAGNOSIS — M25562 Pain in left knee: Secondary | ICD-10-CM | POA: Insufficient documentation

## 2018-10-16 DIAGNOSIS — Z79899 Other long term (current) drug therapy: Secondary | ICD-10-CM | POA: Insufficient documentation

## 2018-10-16 DIAGNOSIS — Z91040 Latex allergy status: Secondary | ICD-10-CM | POA: Insufficient documentation

## 2018-10-16 DIAGNOSIS — F17210 Nicotine dependence, cigarettes, uncomplicated: Secondary | ICD-10-CM | POA: Insufficient documentation

## 2018-10-16 NOTE — Discharge Instructions (Addendum)
Please read attached information. If you experience any new or worsening signs or symptoms please return to the emergency room for evaluation. Please follow-up with your primary care provider or specialist as discussed.  °

## 2018-10-16 NOTE — ED Triage Notes (Signed)
Pt in c/o left knee pain, unsure of specific injury, c/o pain for three days

## 2018-10-16 NOTE — ED Provider Notes (Signed)
[facility] Provider Note   CSN: [id number] Arrival date & time: 10/16/18  1048   History   Chief Complaint Chief Complaint  Patient presents with  . Knee Pain    HPI [facility] is a 42 y.o. female.  HPI    43 year old female with a past medical history of fibromyalgia degenerative disc disease, sciatica presents today with complaints of left knee pain.  Patient notes symptoms started approximately 2 days ago with an ache in her posterior knee.  She notes pain with range of motion of the knee, ambulation.  She notes her knee was swollen yesterday but is not swollen now.  She denies any fever, distal neurological deficits.  No trauma.  She notes she has had difficulties with this knee in the past.  She notes she supposed to be using a cane but has not been using it and has been walking significant amounts of distance.  Past Medical History:  Diagnosis Date  . Asthma 10/1993  . Fibromyalgia   . H/O degenerative disc disease   . IBS (irritable bowel syndrome) 10/2003  . Migraine 10/1996  . Renal disorder   . Sciatica   . Vertigo 10/2009    Patient Active Problem List   Diagnosis Date Noted  . 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 Class 3 severe obesity due to excess calories without serious comorbidity with body mass index (BMI) of 40.0 to 44.9 in adult (HCC) 08/06/2017  . History of gestational diabetes 04/17/2017  . Bilateral hearing loss 03/24/2017  . Acute pain of right foot 03/24/2017  . Insomnia 03/05/2017  . Plantar fasciitis, left 10/23/2016  . Chronic bilateral low back pain with bilateral sciatica 09/08/2016  . IBS (irritable bowel syndrome) 05/23/2016  . Asthma   . Boils 03/10/2016  . Anxiety state 03/10/2016  . H/O degenerative disc disease   . Fibromyalgia   . Migraine 10/09/1996    Past Surgical History:  Procedure Laterality Date  . ABDOMINAL HYSTERECTOMY    . CHOLECYSTECTOMY    . TUBAL LIGATION        OB History    Gravida  2   Para  2   Term      Preterm      AB      Living  2     SAB      TAB      Ectopic      Multiple      Live Births               Home Medications    Prior to Admission medications   Medication Sig Start Date End Date Taking? Authorizing Provider  cariprazine (VRAYLAR) capsule Take by mouth.    [provider]  diclofenac (VOLTAREN) 75 MG EC tablet Take 1 tablet (75 mg total) by mouth 2 (two) times daily. Patient not taking: Reported on 12/06/2017 03/22/17   Dessa Phi, MD  divalproex (DEPAKOTE) 500 MG DR tablet Take 500 mg by mouth 3 (three) times daily.     [provider]  DULoxetine (CYMBALTA) 30 MG capsule Take 1 capsule (30 mg total) by mouth daily. Patient not taking: Reported on 05/23/2018 12/06/17   Marcine Matar, MD  ibuprofen (ADVIL,MOTRIN) 600 MG tablet Take 1 tablet (600 mg total) by mouth every 8 (eight) hours as needed. 02/13/18   Marcine Matar, MD  meloxicam (MOBIC) 15 MG tablet Take 1 tablet (15 mg total) by mouth daily.  09/03/18   Maczis, Elmer SowMichael M, PA-C  methocarbamol (ROBAXIN) 500 MG tablet Take 1 tablet (500 mg total) by mouth at bedtime as needed for muscle spasms. 09/03/18   Maczis, Elmer SowMichael M, PA-C  SUMAtriptan (IMITREX) 50 MG tablet Take 1 take at start of headache.  May repeat in 2 hrs if no relief.  Max 100 mg/24 hr Patient not taking: Reported on 03/14/2018 02/13/18   Marcine MatarJohnson, Deborah B, MD  traZODone (DESYREL) 150 MG tablet Take by mouth at bedtime. 1-2 tablets at bedtime prn    [provider]    Family History Family History  Problem Relation Age of Onset  . Cancer Mother   . Cancer Father   . Diabetes Maternal Grandfather     Social History Social History   Tobacco Use  . Smoking status: Current Every Day Smoker    Packs/day: 0.50    Types: Cigarettes  . Smokeless tobacco: Never Used  Substance Use Topics  . Alcohol use: No  . Drug use: No     Allergies    Codeine; Iodine; and Latex   Review of Systems Review of Systems  All other systems reviewed and are negative.    Physical Exam Updated Vital Signs BP (!) 143/103 (BP Location: Right Arm)   Pulse 87   Temp 97.6 F (36.4 C) (Oral)   Resp 20   SpO2 100%   Physical Exam Vitals signs and nursing note reviewed.  Constitutional:      Appearance: She is well-developed.  HENT:     Head: Normocephalic and atraumatic.  Eyes:     General: No scleral icterus.       Right eye: No discharge.        Left eye: No discharge.     Conjunctiva/sclera: Conjunctivae normal.     Pupils: Pupils are equal, round, and reactive to light.  Neck:     Musculoskeletal: Normal range of motion.     Vascular: No JVD.     Trachea: No tracheal deviation.  Pulmonary:     Effort: Pulmonary effort is normal.     Breath sounds: No stridor.  Musculoskeletal:     Comments: Left knee is atraumatic without redness swelling warmth-greater than 90 degrees of flexion achieved with some pain, tenderness palpation of the popliteal fossa and minor anterior joint line tenderness, no edema noted to the remainder of the lower extremity pedal pulse 2+, sensation intact, cap refill intact to toes  Neurological:     Mental Status: She is alert and oriented to person, place, and time.     Coordination: Coordination normal.  Psychiatric:        Behavior: Behavior normal.        Thought Content: Thought content normal.        Judgment: Judgment normal.      ED Treatments / Results  Labs (all labs ordered are listed, but only abnormal results are displayed) Labs Reviewed - No data to display  EKG None  Radiology No results found.  Procedures Procedures (including critical care time)  Medications Ordered in ED Medications - No data to display   Initial Impression / Assessment and Plan / ED Course  I have reviewed the triage vital signs and the nursing notes.  Pertinent labs & imaging results that were  available during my care of the patient were reviewed by me and considered in my medical decision making (see chart for details).     43 year old female presents today with knee pain.  Likely worsening chronic knee pain.  She has no acute signs of infection, trauma or any indications for further evaluation and management in the ED setting.  Patient is encouraged to continue using anti-inflammatories, follow-up with orthopedics return if she develops any concerning signs or symptoms.  She verbalized understanding and agreement to today's plan.  Final Clinical Impressions(s) / ED Diagnoses   Final diagnoses:  Acute pain of left knee    ED Discharge Orders    None       Eyvonne Mechanic, PA-C 10/16/18 1109    Sabas Sous, MD 10/18/18 1318

## 2018-11-07 ENCOUNTER — Ambulatory Visit: Payer: Self-pay | Admitting: Internal Medicine

## 2019-06-02 IMAGING — MR MR LUMBAR SPINE W/O CM
4 of 5 series · 26 of 48 positions shown · non-contrast
Comparison: Prior radiographs from 06/15/2017.

CLINICAL DATA: Initial evaluation for lower back pain with pain and
numbness extending into the lower extremities bilaterally for 2-3
months.

EXAM:
MRI LUMBAR SPINE WITHOUT CONTRAST
TECHNIQUE: Multiplanar, multisequence MR imaging of the lumbar spine was
performed. No intravenous contrast was administered.

[Series 5: T2 · sagittal · 4.0mm · 0.68mm/px · 6 of 16 slices shown (1 of 2)]
[im 1/16]
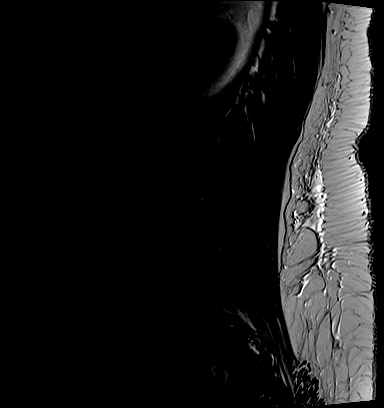
[im 4/16]
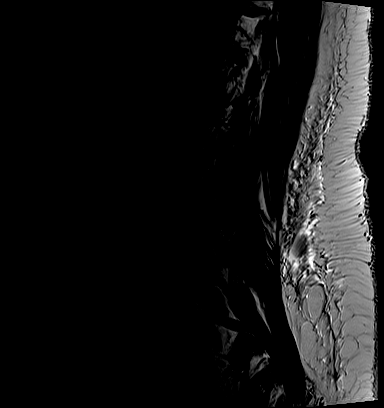
[im 7/16]
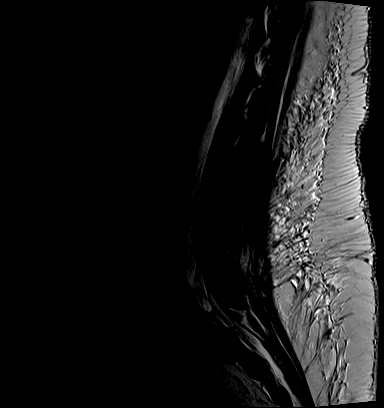
[im 10/16]
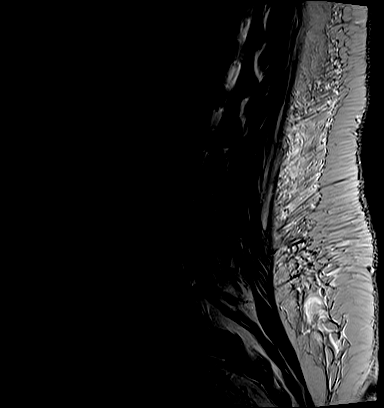
[im 13/16]
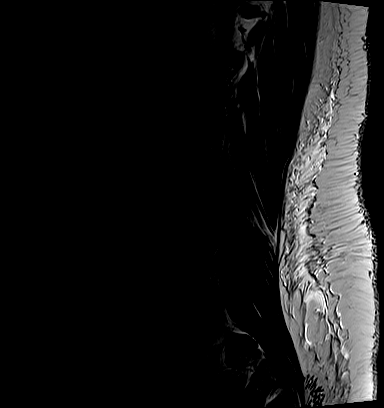
[im 16/16]
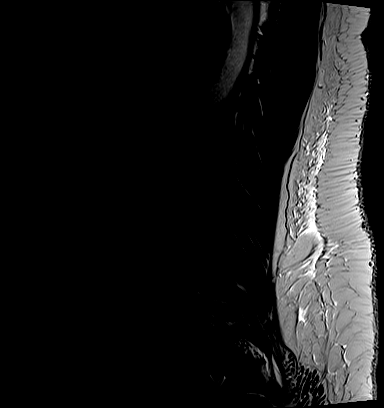

[Series 7: T1 · sagittal · 4.0mm · 0.88mm/px · 6 of 16 slices shown (1 of 2)]
[im 1/16]
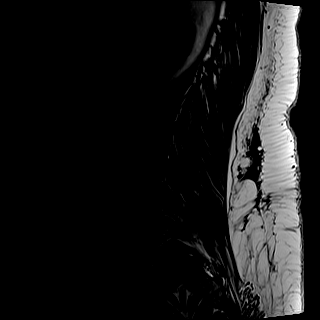
[im 4/16]
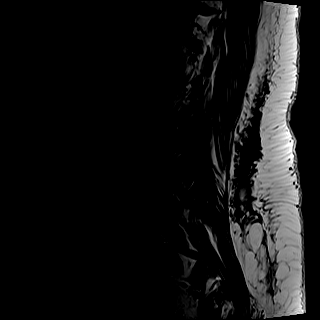
[im 7/16]
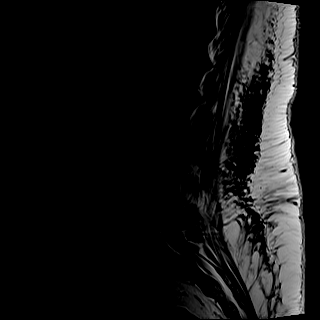
[im 10/16]
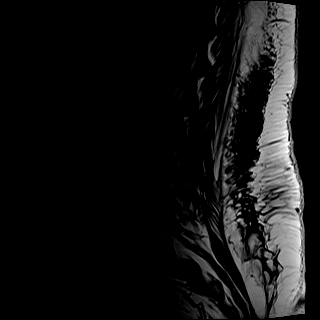
[im 13/16]
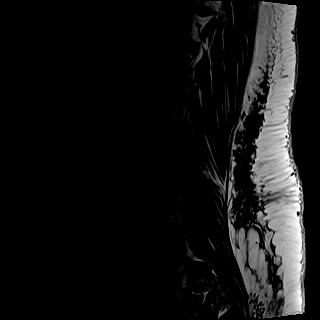
[im 16/16]
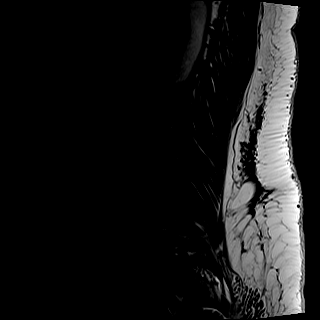

[Series 8: T2 · axial · 4.0mm · 0.57mm/px · z∈[-152,+90]mm · 9 of 37 slices shown (2 of 2)]
[im 1/37]
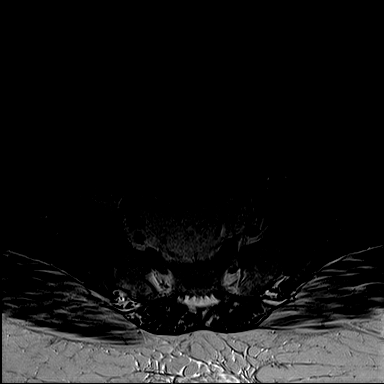
[im 6/37]
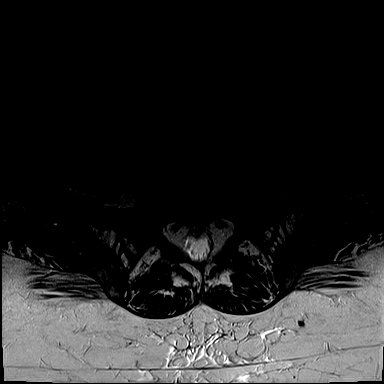
[im 11/37]
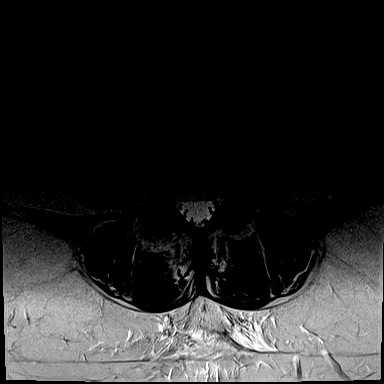
[im 16/37]
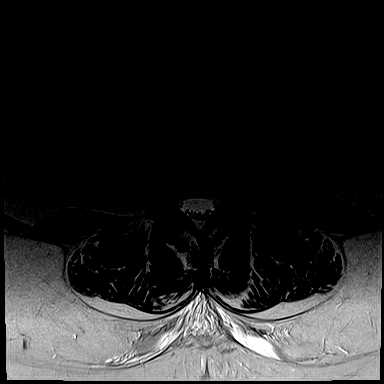
[im 19/37]
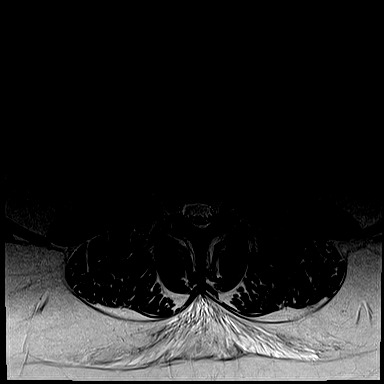
[im 21/37]
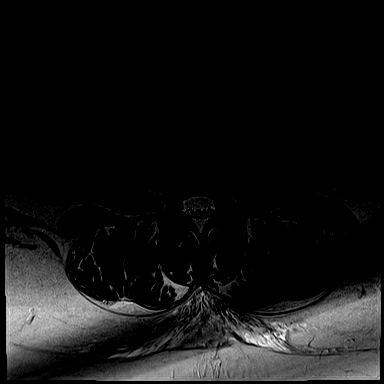
[im 26/37]
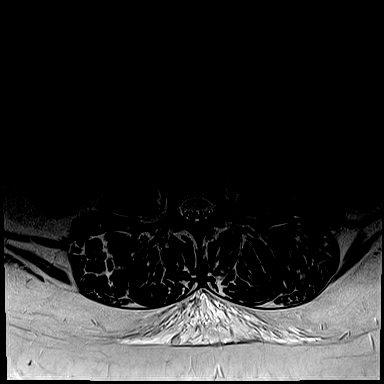
[im 31/37]
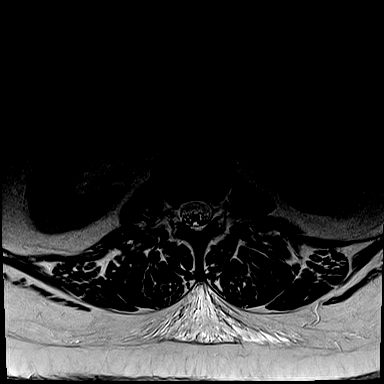
[im 37/37]
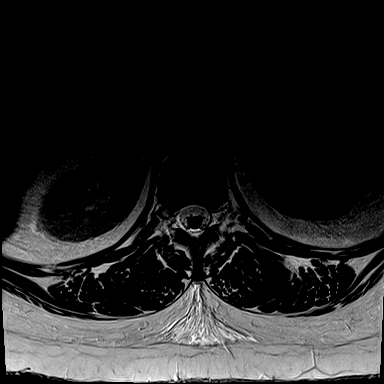

[Series 9: T1 · axial · 4.0mm · 0.34mm/px · z∈[-152,+60]mm · 5 of 37 slices shown (2 of 2)]
[im 1/37]
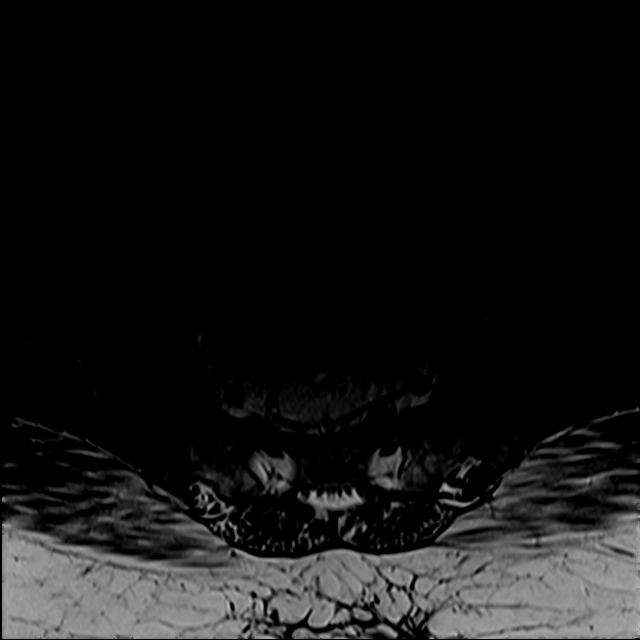
[im 6/37]
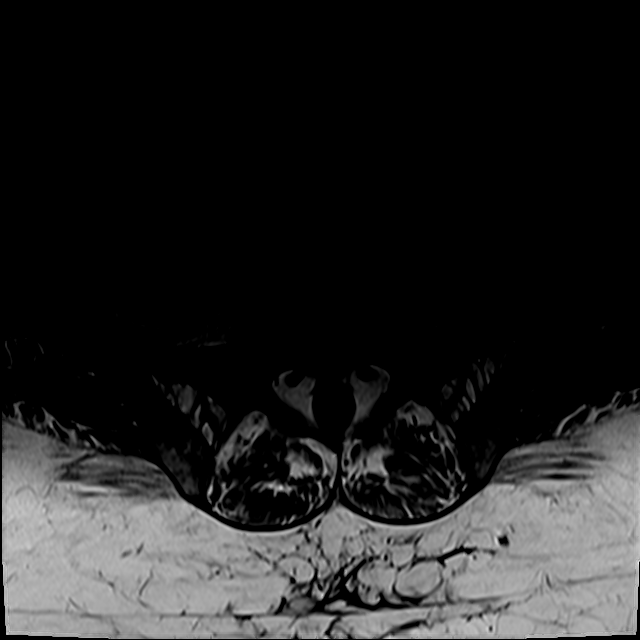
[im 11/37]
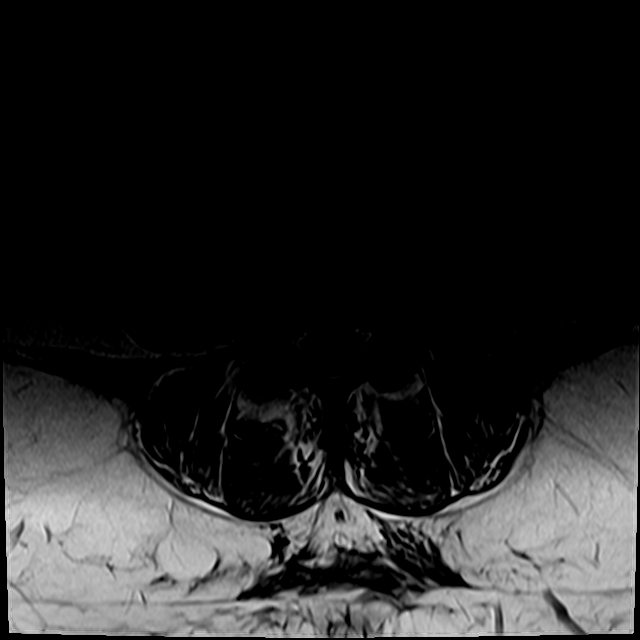
[im 19/37]
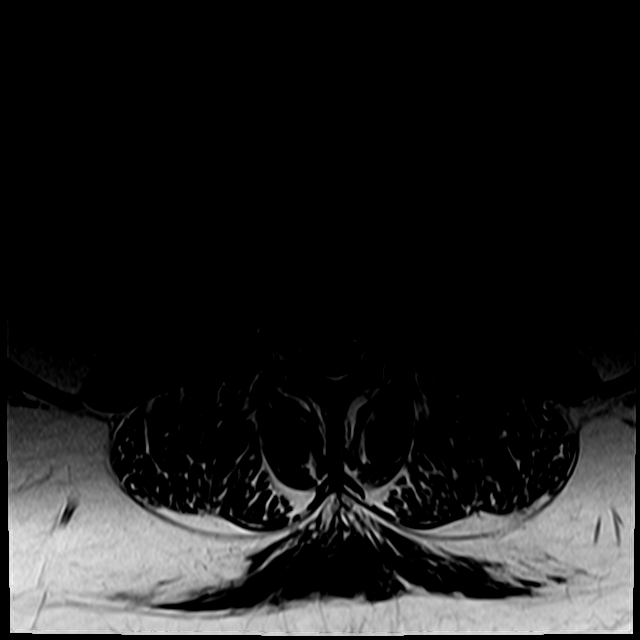
[im 31/37]
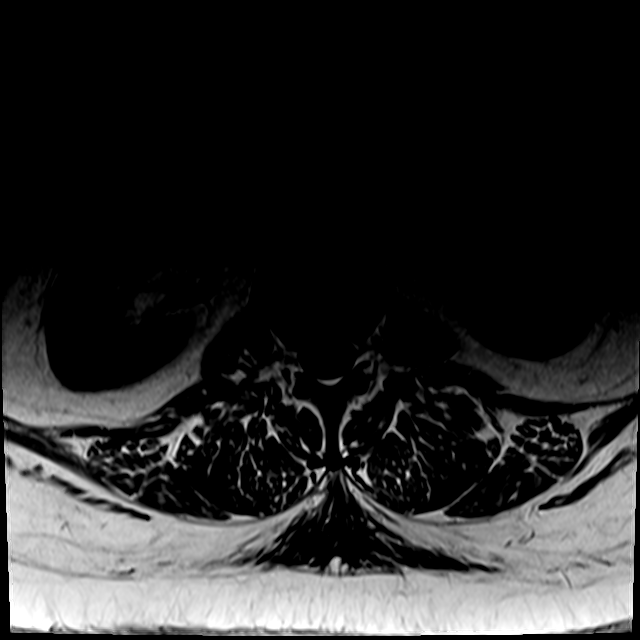

[26 of 48 positions shown; findings below may reference images not displayed]

FINDINGS: Segmentation: Normal segmentation. Lowest well-formed disc labeled
the L5-S1 level.

Alignment: Vertebral bodies normally aligned with preservation of
the normal lumbar lordosis. No listhesis.

Vertebrae: Vertebral body height maintained without evidence for
acute or chronic fracture. Bone marrow signal intensity within
normal limits. No discrete or worrisome osseous lesions. No abnormal
marrow edema.

Conus medullaris and cauda equina: Conus extends to the L2 level.
Conus and cauda equina appear normal.

Paraspinal and other soft tissues: Paraspinous soft tissues within
normal limits. Visualized visceral structures are unremarkable.

Disc levels:

L1-2:  Unremarkable.

L2-3:  Unremarkable.

L3-4:  Unremarkable.

L4-5: Normal interspace. Mild bilateral facet hypertrophy. Trace
joint effusion on the left. No canal or foraminal stenosis.

L5-S1: Normal interspace. Mild to moderate bilateral facet
hypertrophy. No canal or lateral recess narrowing. Foramina remain
patent.
IMPRESSION: 1. Mild to moderate bilateral facet hypertrophy at L4-5 and L5-S1.
2. Otherwise unremarkable MRI of the lumbar spine. No significant
disc pathology. No stenosis or neural impingement.

## 2019-06-04 ENCOUNTER — Ambulatory Visit: Payer: Self-pay | Admitting: Family Medicine

## 2019-10-18 ENCOUNTER — Encounter (HOSPITAL_COMMUNITY): Payer: Self-pay | Admitting: Emergency Medicine

## 2019-10-18 ENCOUNTER — Other Ambulatory Visit: Payer: Self-pay

## 2019-10-18 ENCOUNTER — Emergency Department (HOSPITAL_COMMUNITY)
Admission: EM | Admit: 2019-10-18 | Discharge: 2019-10-18 | Disposition: A | Discharge status: Home / Self Care | Payer: Self-pay | Attending: Emergency Medicine | Admitting: Emergency Medicine

## 2019-10-18 DIAGNOSIS — G44209 Tension-type headache, unspecified, not intractable: Secondary | ICD-10-CM | POA: Insufficient documentation

## 2019-10-18 DIAGNOSIS — J45909 Unspecified asthma, uncomplicated: Secondary | ICD-10-CM | POA: Insufficient documentation

## 2019-10-18 DIAGNOSIS — R110 Nausea: Secondary | ICD-10-CM | POA: Insufficient documentation

## 2019-10-18 DIAGNOSIS — F17210 Nicotine dependence, cigarettes, uncomplicated: Secondary | ICD-10-CM | POA: Insufficient documentation

## 2019-10-18 DIAGNOSIS — Z91040 Latex allergy status: Secondary | ICD-10-CM | POA: Insufficient documentation

## 2019-10-18 DIAGNOSIS — Z79899 Other long term (current) drug therapy: Secondary | ICD-10-CM | POA: Insufficient documentation

## 2019-10-18 DIAGNOSIS — R0602 Shortness of breath: Secondary | ICD-10-CM | POA: Insufficient documentation

## 2019-10-18 MED ORDER — METOCLOPRAMIDE HCL 10 MG PO TABS
10.0000 mg | ORAL_TABLET | Freq: Once | ORAL | Status: AC
Start: 2019-10-18 — End: 2019-10-18
  Administered 2019-10-18: 19:00:00 10 mg via ORAL
  Filled 2019-10-18: qty 1

## 2019-10-18 MED ORDER — ACETAMINOPHEN 325 MG PO TABS
650.0000 mg | ORAL_TABLET | Freq: Once | ORAL | Status: AC
  Administered 2019-10-18: 650 mg via ORAL
  Filled 2019-10-18: qty 2

## 2019-10-18 MED ORDER — DIPHENHYDRAMINE HCL 25 MG PO CAPS
25.0000 mg | ORAL_CAPSULE | Freq: Once | ORAL | Status: AC
  Administered 2019-10-18: 19:00:00 25 mg via ORAL
  Filled 2019-10-18: qty 1

## 2019-10-18 MED ORDER — TOPIRAMATE 25 MG PO TABS: 25.0000 mg | ORAL_TABLET | Freq: Every day | ORAL | 0 refills | Status: DC

## 2019-10-18 MED ORDER — CYCLOBENZAPRINE HCL 10 MG PO TABS: 5.0000 mg | ORAL_TABLET | Freq: Two times a day (BID) | ORAL | 0 refills | Status: AC | PRN

## 2019-10-18 MED ORDER — CYCLOBENZAPRINE HCL 5 MG PO TABS
7.5000 mg | ORAL_TABLET | Freq: Once | ORAL | Status: AC
  Administered 2019-10-18: 20:00:00 7.5 mg via ORAL
  Filled 2019-10-18: qty 1.5

## 2019-10-18 NOTE — ED Notes (Signed)
Patient verbalizes understanding of discharge instructions. Opportunity for questioning and answers were provided. Armband removed by staff, pt discharged from ED.  

## 2019-10-18 NOTE — ED Provider Notes (Signed)
[facility] EMERGENCY DEPARTMENT Provider Note   CSN: [id number] Arrival date & time: 10/18/19  1721     History Chief Complaint  Patient presents with  . Headache  . Shortness of Breath  . Nausea    Kathy Conway is a 44 y.o. female.  HPI Kathy Conway is a 44 y.o. female with a medical history of tension headache who presents to the ED for tight/burning headache ongoing for the past two weeks, she reports constant. Mild improvement with motrin. Associated nausea but no fever, vomiting, weakness, numbness, illicit drug use, photophobia.  She also described shortness of breath in her chief complaint. She describes feeling short of breath at times for years and thought that this would improve after quitting smoking tobacco in April. She states her shortness of breath is baseline and the same she has had for years.      Past Medical History:  Diagnosis Date  . Asthma 10/1993  . Fibromyalgia   . H/O degenerative disc disease   . IBS (irritable bowel syndrome) 10/2003  . Migraine 10/1996  . Renal disorder   . Sciatica   . Vertigo 10/2009    Patient Active Problem List   Diagnosis Date Noted  . 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 Class 3 severe obesity due to excess calories without serious comorbidity with body mass index (BMI) of 40.0 to 44.9 in adult (HCC) 08/06/2017  . History of gestational diabetes 04/17/2017  . Bilateral hearing loss 03/24/2017  . Acute pain of right foot 03/24/2017  . Insomnia 03/05/2017  . Plantar fasciitis, left 10/23/2016  . Chronic bilateral low back pain with bilateral sciatica 09/08/2016  . IBS (irritable bowel syndrome) 05/23/2016  . Asthma   . Boils 03/10/2016  . Anxiety state 03/10/2016  . H/O degenerative disc disease   . Fibromyalgia   . Migraine 10/09/1996    Past Surgical History:  Procedure Laterality Date  . ABDOMINAL HYSTERECTOMY    . CHOLECYSTECTOMY    . TUBAL LIGATION       OB History    Gravida  2   Para  2   Term      Preterm      AB      Living  2     SAB      TAB      Ectopic      Multiple      Live Births              Family History  Problem Relation Age of Onset  . Cancer Mother   . Cancer Father   . Diabetes Maternal Grandfather     Social History   Tobacco Use  . Smoking status: Current Every Kathy Conway Smoker    Packs/Kathy Conway: 0.50    Types: Cigarettes  . Smokeless tobacco: Never Used  Substance Use Topics  . Alcohol use: No  . Drug use: No    Home Medications Prior to Admission medications   Medication Sig Start Date End Date Taking? Authorizing Provider  cariprazine (VRAYLAR) capsule Take by mouth.    [provider]  cyclobenzaprine (FLEXERIL) 10 MG tablet Take 0.5 tablets (5 mg total) by mouth 2 (two) times daily as needed for up to 2 days for muscle spasms. 10/18/19 10/20/19  Hillary Struss, Ladona Ridgel, MD  diclofenac (VOLTAREN) 75 MG EC tablet Take 1 tablet (75 mg total) by mouth 2 (two) times daily. Patient not taking: Reported on 12/06/2017 03/22/17   Funches,  Josalyn, MD  divalproex (DEPAKOTE) 500 MG DR tablet Take 500 mg by mouth 3 (three) times daily.     [provider]  DULoxetine (CYMBALTA) 30 MG capsule Take 1 capsule (30 mg total) by mouth daily. Patient not taking: Reported on 05/23/2018 12/06/17   Ladell Pier, MD  ibuprofen (ADVIL,MOTRIN) 600 MG tablet Take 1 tablet (600 mg total) by mouth every 8 (eight) hours as needed. 02/13/18   Ladell Pier, MD  meloxicam (MOBIC) 15 MG tablet Take 1 tablet (15 mg total) by mouth daily. 09/03/18   Maczis, Barth Kirks, PA-C  methocarbamol (ROBAXIN) 500 MG tablet Take 1 tablet (500 mg total) by mouth at bedtime as needed for muscle spasms. 09/03/18   Maczis, Barth Kirks, PA-C  SUMAtriptan (IMITREX) 50 MG tablet Take 1 take at start of headache.  May repeat in 2 hrs if no relief.  Max 100 mg/24 hr Patient not taking: Reported on 03/14/2018 02/13/18   Ladell Pier, MD  topiramate  (TOPAMAX) 25 MG tablet Take 1 tablet (25 mg total) by mouth at bedtime. 10/18/19 11/17/19  Tad Fancher, Lovena Le, MD  traZODone (DESYREL) 150 MG tablet Take by mouth at bedtime. 1-2 tablets at bedtime prn    [provider]    Allergies    Codeine, Iodine, and Latex  Review of Systems   Review of Systems  Constitutional: Negative for chills and fever.  HENT: Negative for ear pain and sore throat.   Eyes: Negative for pain and visual disturbance.  Respiratory: Negative for cough.   Cardiovascular: Negative for chest pain and palpitations.  Gastrointestinal: Negative for abdominal pain and vomiting.  Genitourinary: Negative for dysuria and hematuria.  Musculoskeletal: Negative for arthralgias and back pain.  Skin: Negative for color change and rash.  Neurological: Positive for headaches. Negative for seizures and syncope.  All other systems reviewed and are negative.   Physical Exam Updated Vital Signs BP 120/64   Pulse 80   Temp 98.6 F (37 C) (Oral)   Resp 17   SpO2 99%   Physical Exam Vitals and nursing note reviewed.  Constitutional:      General: She is not in acute distress.    Appearance: Normal appearance. She is well-developed. She is obese. She is not ill-appearing.  HENT:     Head: Normocephalic and atraumatic.     Right Ear: External ear normal.     Left Ear: External ear normal.     Nose: Nose normal. No rhinorrhea.     Mouth/Throat:     Mouth: Mucous membranes are moist.  Eyes:     General:        Right eye: No discharge.        Left eye: No discharge.     Conjunctiva/sclera: Conjunctivae normal.  Cardiovascular:     Rate and Rhythm: Normal rate and regular rhythm.     Pulses: Normal pulses.     Heart sounds: Normal heart sounds. No murmur.  Pulmonary:     Effort: Pulmonary effort is normal. No respiratory distress.     Breath sounds: Normal breath sounds. No wheezing or rales.  Abdominal:     General: Abdomen is flat. There is no distension.      Palpations: Abdomen is soft.     Tenderness: There is no abdominal tenderness.  Musculoskeletal:        General: No deformity or signs of injury. Normal range of motion.     Cervical back: Normal range of motion  and neck supple.  Skin:    General: Skin is warm and dry.     Capillary Refill: Capillary refill takes less than 2 seconds.     Coloration: Skin is not jaundiced.  Neurological:     General: No focal deficit present.     Mental Status: She is alert and oriented to person, place, and time. Mental status is at baseline.     Cranial Nerves: No cranial nerve deficit.     Sensory: No sensory deficit.     Motor: No weakness.     Coordination: Coordination normal.     Comments: Awake, alert, and oriented x4 Gait intact Finger to nose intact Plantar response No pronator drift Sensation intact to LT Strength 5/5 in the BUE and BLE  CRANIAL NERVES: 2 (Optic)-PERRLA. VF intact to confrontation. 3/4/6 (Oculomotor, Trochlear, Abudcens)- EOMI 5 (Trigeminal)-Sensation intact topinprick 7 (Facial)-Symmetric facial expression 8 (Vestibulococchlear)-Responds to voice 9/10 (Glossopharyngeal)-Symmetric palate and uvula elevation 11 (Spinal accessory)-Head midline 12 (Hypoglossal)-Tongue Midline   Psychiatric:        Mood and Affect: Mood normal.        Behavior: Behavior normal.     ED Results / Procedures / Treatments   Labs (all labs ordered are listed, but only abnormal results are displayed) Labs Reviewed - No data to display  EKG EKG Interpretation  Date/Time:  Saturday October 18 2019 17:32:54 EST Ventricular Rate:  101 PR Interval:  156 QRS Duration: 78 QT Interval:  348 QTC Calculation: 451 R Axis:   43 Text Interpretation: Sinus tachycardia Otherwise normal ECG rate faster than previous, otherwise similar Confirmed by Frederick Peers [phone]) on 10/18/2019 6:44:29 PM   Radiology No results found.  Procedures Procedures (including critical care  time)  Medications Ordered in ED Medications  metoCLOPramide (REGLAN) tablet 10 mg (10 mg Oral Given 10/18/19 1923)  diphenhydrAMINE (BENADRYL) capsule 25 mg (25 mg Oral Given 10/18/19 1923)  acetaminophen (TYLENOL) tablet 650 mg (650 mg Oral Given 10/18/19 1923)  cyclobenzaprine (FLEXERIL) tablet 7.5 mg (7.5 mg Oral Given 10/18/19 2021)    ED Course  I have reviewed the triage vital signs and the nursing notes.  Pertinent labs & imaging results that were available during my care of the patient were reviewed by me and considered in my medical decision making (see chart for details).    MDM Rules/Calculators/A&P                      CLOA BUSHONG is a 44 y.o. female with a medical history of tension headache who presents to the ED for tight/burning headache ongoing for the past two weeks, she reports constant. Mild improvement with motrin. Associated nausea but no fever, vomiting, weakness, numbness, illicit drug use, photophobia.  HPI and physical exam as above. She presents awake, alert, hemodynamically stable, afebrile, non toxic. Nonfocal neurologic exam.  Low suspicion for intracranial mass, intracranial hemorrhage, stroke or meningitis (non focal neurologic exam, well appearing, no meningismus, no infectious symptoms). We treated her with a migraine cocktail and she had some improvement of her symptoms. We added a muscle relaxer for her back tightness and she had marked improvement in her symptoms and feels much better. We decided to restart her topomax and recommended follow up with her neurologist. She is ready for discharge. Discussed plan with patient and family including strict return precautions and follow up with PCP. Patient and family understand and are amenable with plan.   Final Clinical Impression(s) / ED  Diagnoses Final diagnoses:  Tension headache    Rx / DC Orders ED Discharge Orders         Ordered    topiramate (TOPAMAX) 25 MG tablet  Daily at bedtime     10/18/19  1937    cyclobenzaprine (FLEXERIL) 10 MG tablet  2 times daily PRN     10/18/19 2055           Zniya Cottone, Ladona Ridgel, MD 10/18/19 2310    Little, Ambrose Finland, MD 10/18/19 2333

## 2019-10-18 NOTE — ED Triage Notes (Signed)
C/o headache x 1 month that has been worse over the last 2 weeks and unrelieved with any medication.  Also reports nausea and SOB.  States she donates plasma twice per week and is checked for COVID when she donates.

## 2019-12-21 ENCOUNTER — Encounter (HOSPITAL_COMMUNITY): Payer: Self-pay | Admitting: Emergency Medicine

## 2019-12-21 ENCOUNTER — Emergency Department (HOSPITAL_BASED_OUTPATIENT_CLINIC_OR_DEPARTMENT_OTHER): Payer: Self-pay

## 2019-12-21 ENCOUNTER — Emergency Department (HOSPITAL_COMMUNITY)
Admission: EM | Admit: 2019-12-21 | Discharge: 2019-12-21 | Disposition: A | Discharge status: Home / Self Care | Payer: Self-pay | Attending: Emergency Medicine | Admitting: Emergency Medicine

## 2019-12-21 ENCOUNTER — Other Ambulatory Visit: Payer: Self-pay

## 2019-12-21 DIAGNOSIS — W109XXD Fall (on) (from) unspecified stairs and steps, subsequent encounter: Secondary | ICD-10-CM | POA: Insufficient documentation

## 2019-12-21 DIAGNOSIS — Z91040 Latex allergy status: Secondary | ICD-10-CM | POA: Insufficient documentation

## 2019-12-21 DIAGNOSIS — G5600 Carpal tunnel syndrome, unspecified upper limb: Secondary | ICD-10-CM | POA: Insufficient documentation

## 2019-12-21 DIAGNOSIS — F17210 Nicotine dependence, cigarettes, uncomplicated: Secondary | ICD-10-CM | POA: Insufficient documentation

## 2019-12-21 DIAGNOSIS — Z79899 Other long term (current) drug therapy: Secondary | ICD-10-CM | POA: Insufficient documentation

## 2019-12-21 DIAGNOSIS — R52 Pain, unspecified: Secondary | ICD-10-CM

## 2019-12-21 DIAGNOSIS — M79605 Pain in left leg: Secondary | ICD-10-CM | POA: Insufficient documentation

## 2019-12-21 DIAGNOSIS — J45909 Unspecified asthma, uncomplicated: Secondary | ICD-10-CM | POA: Insufficient documentation

## 2019-12-21 HISTORY — DX: Unspecified osteoarthritis, unspecified site: M19.90

## 2019-12-21 NOTE — Discharge Instructions (Addendum)
Please sleep using the splint over your wrist.  Please reach out to your primary care provider regarding today's encounter.  You may also benefit from orthopedic evaluation for suspected carpal tunnel syndrome.  Please take over-the-counter NSAIDs for symptomatic relief of your discomfort.  Suspect that your left leg discomfort is musculoskeletal in nature.  No evidence of clots.  You will need to have a discussion with your primary care provider regarding ongoing pain management.  Return to the ED or seek immediate medical attention for any new or worsening symptoms.

## 2019-12-21 NOTE — ED Provider Notes (Addendum)
[facility] EMERGENCY DEPARTMENT Provider Note   CSN: [id number] Arrival date & time: 12/21/19  7035     History Chief Complaint  Patient presents with  . Arm Pain  . Knee Pain    Kathy Conway is a 44 y.o. female with PMH significant for degenerative disc disease, fibromyalgia, obesity, and sciatica who presents to the ED with multiple complaints.  Patient reports that she fell on the stairs a few weeks ago and approximately 10 days ago developed left-sided knee discomfort.  No significant swelling, but the knee discomfort is in the popliteal region extends proximally towards hamstring and distally towards calf.  She also is experiencing a 4-day history of "burning numbness" involving her right wrist with diminished sensation of her 2-4 fingers on right hand.  She reports that she works a physically demanding job with some repetitive motion.  She denies any fevers or chills, chest pain or difficulty breathing, cough, weakness, difficulty ambulating, back pain, skin changes, history of clots, clotting disorder, or other symptoms.  HPI     Past Medical History:  Diagnosis Date  . Arthritis   . Asthma 10/1993  . Fibromyalgia   . H/O degenerative disc disease   . IBS (irritable bowel syndrome) 10/2003  . Migraine 10/1996  . Renal disorder   . Sciatica   . Vertigo 10/2009    Patient Active Problem List   Diagnosis Date Noted  . 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 Class 3 severe obesity due to excess calories without serious comorbidity with body mass index (BMI) of 40.0 to 44.9 in adult (HCC) 08/06/2017  . History of gestational diabetes 04/17/2017  . Bilateral hearing loss 03/24/2017  . Acute pain of right foot 03/24/2017  . Insomnia 03/05/2017  . Plantar fasciitis, left 10/23/2016  . Chronic bilateral low back pain with bilateral sciatica 09/08/2016  . IBS (irritable bowel syndrome) 05/23/2016  . Asthma   . Boils 03/10/2016  . Anxiety state  03/10/2016  . H/O degenerative disc disease   . Fibromyalgia   . Migraine 10/09/1996    Past Surgical History:  Procedure Laterality Date  . ABDOMINAL HYSTERECTOMY    . CHOLECYSTECTOMY    . TUBAL LIGATION       OB History    Gravida  2   Para  2   Term      Preterm      AB      Living  2     SAB      TAB      Ectopic      Multiple      Live Births              Family History  Problem Relation Age of Onset  . Cancer Mother   . Cancer Father   . Diabetes Maternal Grandfather     Social History   Tobacco Use  . Smoking status: Current Every Day Smoker    Packs/day: 0.50    Types: Cigarettes  . Smokeless tobacco: Never Used  Substance Use Topics  . Alcohol use: No  . Drug use: No    Home Medications Prior to Admission medications   Medication Sig Start Date End Date Taking? Authorizing Provider  cariprazine (VRAYLAR) capsule Take by mouth.    [provider]  diclofenac (VOLTAREN) 75 MG EC tablet Take 1 tablet (75 mg total) by mouth 2 (two) times daily. Patient not taking: Reported on 12/06/2017 03/22/17   Dessa Phi,  MD  divalproex (DEPAKOTE) 500 MG DR tablet Take 500 mg by mouth 3 (three) times daily.     [provider]  DULoxetine (CYMBALTA) 30 MG capsule Take 1 capsule (30 mg total) by mouth daily. Patient not taking: Reported on 05/23/2018 12/06/17   Ladell Pier, MD  ibuprofen (ADVIL,MOTRIN) 600 MG tablet Take 1 tablet (600 mg total) by mouth every 8 (eight) hours as needed. 02/13/18   Ladell Pier, MD  meloxicam (MOBIC) 15 MG tablet Take 1 tablet (15 mg total) by mouth daily. 09/03/18   Maczis, Barth Kirks, PA-C  methocarbamol (ROBAXIN) 500 MG tablet Take 1 tablet (500 mg total) by mouth at bedtime as needed for muscle spasms. 09/03/18   Maczis, Barth Kirks, PA-C  SUMAtriptan (IMITREX) 50 MG tablet Take 1 take at start of headache.  May repeat in 2 hrs if no relief.  Max 100 mg/24 hr Patient not taking: Reported  on 03/14/2018 02/13/18   Ladell Pier, MD  topiramate (TOPAMAX) 25 MG tablet Take 1 tablet (25 mg total) by mouth at bedtime. 10/18/19 11/17/19  Day, Lovena Le, MD  traZODone (DESYREL) 150 MG tablet Take by mouth at bedtime. 1-2 tablets at bedtime prn    [provider]    Allergies    Codeine, Iodine, and Latex  Review of Systems   Review of Systems  Constitutional: Negative for chills and fever.  Musculoskeletal: Positive for arthralgias and joint swelling.  Skin: Negative for color change and wound.  Neurological: Positive for numbness. Negative for weakness.    Physical Exam Updated Vital Signs BP (!) 148/100 (BP Location: Left Arm)   Pulse 88   Temp 98.1 F (36.7 C) (Oral)   Resp 16   Ht 5\' 5"  (1.651 m)   Wt 130.6 kg   SpO2 98%   BMI 47.93 kg/m   Physical Exam Vitals and nursing note reviewed. Exam conducted with a chaperone present.  Constitutional:      General: She is not in acute distress.    Appearance: Normal appearance. She is obese.  HENT:     Head: Normocephalic and atraumatic.  Eyes:     General: No scleral icterus.    Conjunctiva/sclera: Conjunctivae normal.  Neck:     Comments: No meningismus. Cardiovascular:     Rate and Rhythm: Normal rate and regular rhythm.     Pulses: Normal pulses.     Heart sounds: Normal heart sounds.  Pulmonary:     Effort: Pulmonary effort is normal. No respiratory distress.     Breath sounds: Normal breath sounds.  Musculoskeletal:     Cervical back: Normal range of motion. No rigidity.     Comments: Left knee: ROM intact, albeit mildly limited due to discomfort.  No overlying skin changes or significant swelling appreciated.  No focal bony TTP.  Mild TTP in popliteal region with mild extension proximally and distally.  Left hip: Normal.  ROM intact. Left ankle: Normal.  Pedal pulse intact.  Sensation intact. Right wrist: Negative Tinel's sign, but positive Phalen's test.  Cap refill and radial pulse intact.   Sensation intact throughout, albeit mildly diminished in distal third of phalanges 2 through 4.  Right elbow: No bony TTP.  No overlying skin changes.  No swelling.  No obvious joint effusion.  Skin:    General: Skin is dry.     Capillary Refill: Capillary refill takes less than 2 seconds.  Neurological:     Mental Status: She is alert and oriented  to person, place, and time.     GCS: GCS eye subscore is 4. GCS verbal subscore is 5. GCS motor subscore is 6.  Psychiatric:        Mood and Affect: Mood normal.        Behavior: Behavior normal.        Thought Content: Thought content normal.     ED Results / Procedures / Treatments   Labs (all labs ordered are listed, but only abnormal results are displayed) Labs Reviewed - No data to display  EKG None  Radiology VAS [place] LOWER EXTREMITY VENOUS (DVT) ([facility] and [facility] 7a-7p)  Result Date: 12/21/2019  Lower Venous DVTStudy Indications: Posterior knee pain.  Limitations: Body habitus and patient's inability to tolerate compressions. Comparison Study: No prior study on file Performing Technologist: Sherren Kerns RVS  Examination Guidelines: A complete evaluation includes B-mode imaging, spectral Doppler, color Doppler, and power Doppler as needed of all accessible portions of each vessel. Bilateral testing is considered an integral part of a complete examination. Limited examinations for reoccurring indications may be performed as noted. The reflux portion of the exam is performed with the patient in reverse Trendelenburg.  Right Technical Findings: Right leg not evaluated.  +---------+---------------+---------+-----------+----------+-------------------+ LEFT     CompressibilityPhasicitySpontaneityPropertiesThrombus Aging      +---------+---------------+---------+-----------+----------+-------------------+ CFV      Full           Yes      Yes                                       +---------+---------------+---------+-----------+----------+-------------------+ SFJ      Full                                                             +---------+---------------+---------+-----------+----------+-------------------+ FV Prox  Full                                                             +---------+---------------+---------+-----------+----------+-------------------+ FV Mid                                                patent by color     +---------+---------------+---------+-----------+----------+-------------------+ FV Distal                                             Not visualized      +---------+---------------+---------+-----------+----------+-------------------+ PFV                                                   Not visualized      +---------+---------------+---------+-----------+----------+-------------------+ POP  Yes      Yes                  patent by color and                                                       Doppler             +---------+---------------+---------+-----------+----------+-------------------+ PTV      Full                                                             +---------+---------------+---------+-----------+----------+-------------------+ PERO     Full                                                             +---------+---------------+---------+-----------+----------+-------------------+     Summary: LEFT: - There is no evidence of deep vein thrombosis in the lower extremity. However, portions of this examination were limited- see technologist comments above.  - No cystic structure found in the popliteal fossa.  *See table(s) above for measurements and observations.    Preliminary     Procedures Procedures (including critical care time)  Medications Ordered in ED Medications - No data to display  ED Course  I have reviewed the triage vital signs and the  nursing notes.  Pertinent labs & imaging results that were available during my care of the patient were reviewed by me and considered in my medical decision making (see chart for details).    MDM Rules/Calculators/A&P                      Obtained ultrasound of left lower extremity given patient's reported swelling and discomfort in popliteal region spanning upper left thigh and into her gastrocnemius.  DVT study was negative for any clotting or evidence of Baker's cyst.  On reevaluation, patient is also endorsing some significant tenderness along area of gracilis muscle.  She does state that this is worse with squatting which she has been doing more of lately.  Her right wrist burning numbness worse in the morning is consistent with median nerve compression, supported with positive Phalen's testing.  Will provide splint and encourage patient to follow-up with her primary care provider.  We will also refer to orthopedics for ongoing evaluation.  She states that the symptoms began shortly after she began working at her new place of employment, [employer].    Her symptoms can also at least be partially contributed to her history of fibromyalgia as she reports that she is off of her medication.  She is waiting for an [employer], as directed by her primary care provider, so she may receive affordable medication.  Her last CMP obtained, albeit years ago, demonstrated normal renal function.  Recommending naproxen as patient is declining possible pregnancy.  Discussed strict ED return precautions with the patient.  All of the evaluation and work-up results were discussed with the  patient and any family at bedside. They were provided opportunity to ask any additional questions and have none at this time. They have expressed understanding of verbal discharge instructions as well as return precautions and are agreeable to the plan.    Final Clinical Impression(s) / ED Diagnoses Final diagnoses:  Median  nerve compression  Left leg pain    Rx / DC Orders ED Discharge Orders    None       Lorelee New, PA-C 12/21/19 1320    Lorelee New, PA-C 12/21/19 1333    Eber Hong, MD 12/21/19 1947

## 2019-12-21 NOTE — ED Triage Notes (Signed)
C/o R arm pain and weakness x 4 days.  States it feels like pain starts in elbow and radiates up arm.  Also reports pain and swelling to L knee x 4 days.  Denies injury.

## 2019-12-21 NOTE — Progress Notes (Signed)
VASCULAR LAB PRELIMINARY  PRELIMINARY  PRELIMINARY  PRELIMINARY  Left lower extremity venous duplex completed.    Preliminary report:  See CV proc for preliminary results.  Gave report to Evelena Leyden, PA-C  Richie Bonanno, RVT 12/21/2019, 12:55 PM

## 2020-05-01 ENCOUNTER — Other Ambulatory Visit: Payer: Self-pay

## 2020-05-01 ENCOUNTER — Emergency Department (HOSPITAL_COMMUNITY)
Admission: EM | Admit: 2020-05-01 | Discharge: 2020-05-01 | Disposition: A | Discharge status: Home / Self Care | Payer: Self-pay | Attending: Emergency Medicine | Admitting: Emergency Medicine

## 2020-05-01 ENCOUNTER — Encounter (HOSPITAL_COMMUNITY): Payer: Self-pay | Admitting: Emergency Medicine

## 2020-05-01 ENCOUNTER — Encounter (HOSPITAL_COMMUNITY): Payer: Self-pay

## 2020-05-01 ENCOUNTER — Emergency Department (HOSPITAL_COMMUNITY)
Admission: EM | Admit: 2020-05-01 | Discharge: 2020-05-02 | Disposition: A | Discharge status: Home / Self Care | Payer: Self-pay | Attending: Emergency Medicine | Admitting: Emergency Medicine

## 2020-05-01 DIAGNOSIS — R6883 Chills (without fever): Secondary | ICD-10-CM | POA: Insufficient documentation

## 2020-05-01 DIAGNOSIS — G44209 Tension-type headache, unspecified, not intractable: Secondary | ICD-10-CM | POA: Insufficient documentation

## 2020-05-01 DIAGNOSIS — R197 Diarrhea, unspecified: Secondary | ICD-10-CM | POA: Insufficient documentation

## 2020-05-01 DIAGNOSIS — M25512 Pain in left shoulder: Secondary | ICD-10-CM | POA: Insufficient documentation

## 2020-05-01 DIAGNOSIS — J45909 Unspecified asthma, uncomplicated: Secondary | ICD-10-CM | POA: Insufficient documentation

## 2020-05-01 DIAGNOSIS — H9209 Otalgia, unspecified ear: Secondary | ICD-10-CM | POA: Insufficient documentation

## 2020-05-01 DIAGNOSIS — F17210 Nicotine dependence, cigarettes, uncomplicated: Secondary | ICD-10-CM | POA: Insufficient documentation

## 2020-05-01 DIAGNOSIS — Z5321 Procedure and treatment not carried out due to patient leaving prior to being seen by health care provider: Secondary | ICD-10-CM | POA: Insufficient documentation

## 2020-05-01 DIAGNOSIS — M25511 Pain in right shoulder: Secondary | ICD-10-CM | POA: Insufficient documentation

## 2020-05-01 DIAGNOSIS — R079 Chest pain, unspecified: Secondary | ICD-10-CM | POA: Insufficient documentation

## 2020-05-01 DIAGNOSIS — H538 Other visual disturbances: Secondary | ICD-10-CM | POA: Insufficient documentation

## 2020-05-01 DIAGNOSIS — G43009 Migraine without aura, not intractable, without status migrainosus: Secondary | ICD-10-CM | POA: Insufficient documentation

## 2020-05-01 DIAGNOSIS — R42 Dizziness and giddiness: Secondary | ICD-10-CM | POA: Insufficient documentation

## 2020-05-01 LAB — CBC WITH DIFFERENTIAL/PLATELET
Abs Immature Granulocytes: 0.12 10*3/uL — ABNORMAL HIGH (ref 0.00–0.07)
Basophils Absolute: 0.1 10*3/uL (ref 0.0–0.1)
Basophils Relative: 0 %
Eosinophils Absolute: 0.4 10*3/uL (ref 0.0–0.5)
Eosinophils Relative: 3 %
HCT: 38.6 % (ref 36.0–46.0)
Hemoglobin: 13.2 g/dL (ref 12.0–15.0)
Immature Granulocytes: 1 %
Lymphocytes Relative: 22 %
Lymphs Abs: 2.6 10*3/uL (ref 0.7–4.0)
MCH: 30.8 pg (ref 26.0–34.0)
MCHC: 34.2 g/dL (ref 30.0–36.0)
MCV: 90.2 fL (ref 80.0–100.0)
Monocytes Absolute: 0.5 10*3/uL (ref 0.1–1.0)
Monocytes Relative: 4 %
Neutro Abs: 8.3 10*3/uL — ABNORMAL HIGH (ref 1.7–7.7)
Neutrophils Relative %: 70 %
Platelets: 251 10*3/uL (ref 150–400)
RBC: 4.28 MIL/uL (ref 3.87–5.11)
RDW: 13 % (ref 11.5–15.5)
WBC: 11.9 10*3/uL — ABNORMAL HIGH (ref 4.0–10.5)
nRBC: 0 % (ref 0.0–0.2)

## 2020-05-01 LAB — COMPREHENSIVE METABOLIC PANEL
ALT: 21 U/L (ref 0–44)
AST: 17 U/L (ref 15–41)
Albumin: 3.3 g/dL — ABNORMAL LOW (ref 3.5–5.0)
Alkaline Phosphatase: 62 U/L (ref 38–126)
Anion gap: 11 (ref 5–15)
BUN: 13 mg/dL (ref 6–20)
CO2: 25 mmol/L (ref 22–32)
Calcium: 9.8 mg/dL (ref 8.9–10.3)
Chloride: 106 mmol/L (ref 98–111)
Creatinine, Ser: 0.82 mg/dL (ref 0.44–1.00)
GFR calc Af Amer: >60 mL/min (ref >60)
GFR calc non Af Amer: >60 mL/min (ref >60)
Glucose, Bld: 148 mg/dL — ABNORMAL HIGH (ref 70–99)
Potassium: 3.7 mmol/L (ref 3.5–5.1)
Sodium: 142 mmol/L (ref 135–145)
Total Bilirubin: 0.5 mg/dL (ref 0.3–1.2)
Total Protein: 6 g/dL — ABNORMAL LOW (ref 6.5–8.1)

## 2020-05-01 NOTE — ED Triage Notes (Signed)
Pt arrives to ED w/ c/o migraine headache x 3 weeks. Pt reports 8/10 pain. Pt endorses vision changes, nausea, ear pain.

## 2020-05-01 NOTE — ED Triage Notes (Signed)
Patient reports persistent migraine headache radiating to ears and jaws for several weeks unrelieved by prescription/OTC medications , denies fever or emesis .

## 2020-05-01 NOTE — ED Notes (Signed)
Patient no longer wanted to wait

## 2020-05-02 MED ORDER — LACTATED RINGERS IV BOLUS
1000.0000 mL | Freq: Once | INTRAVENOUS | Status: AC
  Administered 2020-05-02: 1000 mL via INTRAVENOUS

## 2020-05-02 MED ORDER — SODIUM CHLORIDE 0.9 % IV SOLN
25.0000 mg | Freq: Once | INTRAVENOUS | Status: AC
  Administered 2020-05-02: 25 mg via INTRAVENOUS
  Filled 2020-05-02: qty 0.5

## 2020-05-02 MED ORDER — KETOROLAC TROMETHAMINE 30 MG/ML IJ SOLN
30.0000 mg | Freq: Once | INTRAMUSCULAR | Status: AC
  Administered 2020-05-02: 30 mg via INTRAVENOUS
  Filled 2020-05-02: qty 1

## 2020-05-02 MED ORDER — METOCLOPRAMIDE HCL 5 MG/ML IJ SOLN
10.0000 mg | Freq: Once | INTRAMUSCULAR | Status: AC
  Administered 2020-05-02: 10 mg via INTRAVENOUS
  Filled 2020-05-02: qty 2

## 2020-05-02 MED ORDER — MAGNESIUM SULFATE 2 GM/50ML IV SOLN
2.0000 g | Freq: Once | INTRAVENOUS | Status: AC
  Administered 2020-05-02: 2 g via INTRAVENOUS
  Filled 2020-05-02: qty 50

## 2020-05-02 NOTE — ED Notes (Signed)
Patient verbalizes understanding of discharge instructions. Opportunity for questioning and answers were provided. Armband removed by staff, pt discharged from ED to home 

## 2020-05-02 NOTE — ED Notes (Signed)
Eyes closed chest rising and falling evenly .

## 2020-05-02 NOTE — Discharge Instructions (Addendum)
Thank you for allowing [place] to care for you today. Please return to the ED if you have any worsening of your headache or a sudden headache that returns. Also return if you have any new symptoms that arise. Please follow up with your primary care provider as well.

## 2020-05-02 NOTE — ED Provider Notes (Signed)
[facility] EMERGENCY DEPARTMENT Provider Note   CSN: [id number] Arrival date & time: 05/01/20  1759     History Chief Complaint  Patient presents with  . Headache    Kathy Conway is a 44 y.o. female.  With a PMHx of migraine headaches, fibromyalgia, degenerative disc disease, and asthma who presents to the ED for headaches the past three weeks. The pain is located in the front of her head and wraps around, affecting her temporal area, jaw, and ears, around to the back of her head, down into her neck and shoulders. She states the pain as constant, but fluctuates in severity. She describes the pain as dull and feels like an ice pick. The pain is worse with chewing, any jaw movement, laying flat, leaning forward, or any neck movement. The pain is worsened at work when she looks at a screen all day, it causes lightheadedness and dizziness, while at work. She has tried muscle relaxer, tylenol PM, aleve, ibuprofen, topomax, warm/cold compresses, showers, Excedrin, and at home electrical pulse massager, without relief. She notes her husband giving her a massage helped, but the pain returned soon after. She notes this is different from her normal migraine pain. She has associated symptoms of loss of balance, blurred vision, foggy vision, lacrimation, spots in her eyes. She denies any recent medication changes.   She also reports chills yesterday, reproducible chest pain that started yesterday, and nausea. She also notes diarrhea the past three days but states she has recently changed her diet and started eating more leafy green vegetables. She denies fever, trouble swallowing, shortness of breath, abdominal pain, vomiting, numbness or tingling, problems passing her bowels or urinating.        Past Medical History:  Diagnosis Date  . Arthritis   . Asthma 10/1993  . Fibromyalgia   . H/O degenerative disc disease   . IBS (irritable bowel syndrome) 10/2003  . Migraine 10/1996   . Renal disorder   . Sciatica   . Vertigo 10/2009    Patient Active Problem List   Diagnosis Date Noted  . 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 Class 3 severe obesity due to excess calories without serious comorbidity with body mass index (BMI) of 40.0 to 44.9 in adult ([place]) 08/06/2017  . History of gestational diabetes 04/17/2017  . Bilateral hearing loss 03/24/2017  . Acute pain of right foot 03/24/2017  . Insomnia 03/05/2017  . Plantar fasciitis, left 10/23/2016  . Chronic bilateral low back pain with bilateral sciatica 09/08/2016  . IBS (irritable bowel syndrome) 05/23/2016  . Asthma   . Boils 03/10/2016  . Anxiety state 03/10/2016  . H/O degenerative disc disease   . Fibromyalgia   . Migraine 10/09/1996    Past Surgical History:  Procedure Laterality Date  . ABDOMINAL HYSTERECTOMY    . CHOLECYSTECTOMY    . TUBAL LIGATION       OB History    Gravida  2   Para  2   Term      Preterm      AB      Living  2     SAB      TAB      Ectopic      Multiple      Live Births              Family History  Problem Relation Age of Onset  . Cancer Mother   . Cancer Father   . Diabetes  Maternal Grandfather     Social History   Tobacco Use  . Smoking status: Current Every Day Smoker    Packs/day: 0.50    Types: Cigarettes  . Smokeless tobacco: Never Used  Substance Use Topics  . Alcohol use: No  . Drug use: No    Home Medications Prior to Admission medications   Medication Sig Start Date End Date Taking? Authorizing Provider  cariprazine (VRAYLAR) capsule Take by mouth.    [provider]  diclofenac (VOLTAREN) 75 MG EC tablet Take 1 tablet (75 mg total) by mouth 2 (two) times daily. Patient not taking: Reported on 12/06/2017 03/22/17   Boykin Nearing, MD  divalproex (DEPAKOTE) 500 MG DR tablet Take 500 mg by mouth 3 (three) times daily.     [provider]  DULoxetine (CYMBALTA) 30 MG capsule Take 1 capsule (30 mg  total) by mouth daily. Patient not taking: Reported on 05/23/2018 12/06/17   Ladell Pier, MD  ibuprofen (ADVIL,MOTRIN) 600 MG tablet Take 1 tablet (600 mg total) by mouth every 8 (eight) hours as needed. 02/13/18   Ladell Pier, MD  meloxicam (MOBIC) 15 MG tablet Take 1 tablet (15 mg total) by mouth daily. 09/03/18   Maczis, Barth Kirks, PA-C  methocarbamol (ROBAXIN) 500 MG tablet Take 1 tablet (500 mg total) by mouth at bedtime as needed for muscle spasms. 09/03/18   Maczis, Barth Kirks, PA-C  SUMAtriptan (IMITREX) 50 MG tablet Take 1 take at start of headache.  May repeat in 2 hrs if no relief.  Max 100 mg/24 hr Patient not taking: Reported on 03/14/2018 02/13/18   Ladell Pier, MD  topiramate (TOPAMAX) 25 MG tablet Take 1 tablet (25 mg total) by mouth at bedtime. 10/18/19 11/17/19  Day, Lovena Le, MD  traZODone (DESYREL) 150 MG tablet Take by mouth at bedtime. 1-2 tablets at bedtime prn    [provider]    Allergies    Codeine, Iodine, and Latex  Review of Systems   Review of Systems  Constitutional: Positive for chills. Negative for fatigue and fever.  HENT: Positive for ear pain. Negative for sinus pain and trouble swallowing.   Eyes: Positive for visual disturbance.  Respiratory: Negative for cough and shortness of breath.   Cardiovascular: Positive for chest pain (reproducible). Negative for leg swelling.  Gastrointestinal: Positive for diarrhea. Negative for abdominal pain, nausea and vomiting.  Musculoskeletal: Positive for neck pain. Negative for back pain.  Neurological: Positive for dizziness, light-headedness and headaches.  All other systems reviewed and are negative.   Physical Exam Updated Vital Signs BP (!) 134/78 (BP Location: Left Arm)   Pulse 75   Temp 98.3 F (36.8 C) (Oral)   Resp 16   Ht 5' 5"  (1.651 m)   Wt (!) 130.6 kg   SpO2 98%   BMI 47.93 kg/m   Physical Exam Vitals and nursing note reviewed.  Constitutional:      General: She is not  in acute distress.    Appearance: She is well-developed. She is not ill-appearing or toxic-appearing.  HENT:     Head: Atraumatic.     Mouth/Throat:     Mouth: Mucous membranes are moist.  Eyes:     Extraocular Movements: Extraocular movements intact.     Right eye: Normal extraocular motion.     Left eye: Normal extraocular motion.     Pupils: Pupils are equal, round, and reactive to light.  Cardiovascular:     Rate and Rhythm: Normal rate  and regular rhythm.     Heart sounds: Normal heart sounds. No murmur heard.  No gallop.   Pulmonary:     Effort: Pulmonary effort is normal. No respiratory distress.     Breath sounds: Normal breath sounds. No wheezing, rhonchi or rales.  Abdominal:     General: There is no distension.     Palpations: Abdomen is soft.     Tenderness: There is no abdominal tenderness. There is no guarding.  Musculoskeletal:        General: Tenderness (Trapezius muscle, shoulders) present.     Cervical back: Neck supple.     Comments: Tightness of her SCM and trapezius noted.   Lymphadenopathy:     Cervical: No cervical adenopathy.  Skin:    General: Skin is warm and dry.  Neurological:     Mental Status: She is alert and oriented to person, place, and time.     Cranial Nerves: No facial asymmetry.     Sensory: No sensory deficit.  Psychiatric:        Mood and Affect: Mood normal.        Speech: Speech normal.        Behavior: Behavior normal.     ED Results / Procedures / Treatments   Labs (all labs ordered are listed, but only abnormal results are displayed) Labs Reviewed - No data to display  EKG None  Radiology No results found.  Procedures Procedures (including critical care time)  Medications Ordered in ED Medications  ketorolac (TORADOL) 30 MG/ML injection 30 mg (30 mg Intravenous Given 05/02/20 1026)  magnesium sulfate IVPB 2 g 50 mL (0 g Intravenous Stopped 05/02/20 1119)  lactated ringers bolus 1,000 mL (0 mLs Intravenous Stopped  05/02/20 1205)  diphenhydrAMINE (BENADRYL) 25 mg in sodium chloride 0.9 % 50 mL IVPB (0 mg Intravenous Stopped 05/02/20 1202)  metoCLOPramide (REGLAN) injection 10 mg (10 mg Intravenous Given 05/02/20 1031)    ED Course  I have reviewed the triage vital signs and the nursing notes.  Pertinent labs & imaging results that were available during my care of the patient were reviewed by me and considered in my medical decision making (see chart for details).  12:54 PM Patient states her symptoms are 97% resolved.     MDM Rules/Calculators/A&P                          Zamarah Ullmer is a 44 y/o F who presents to the ED with worsening headache for the past three weeks that radiates in a band like pattern into her neck. She notes the pain is worse with movement. She tried many OTC medications as well as Topamax and muscle relaxer's without any relief. The only relief she found was her partner massaging her neck, however, the pain returned. On physical exam she has tenderness to palpate of the trapezius, and scm. She has limited ROM due to the pain.   CBC was unremarkable, wbc of 11.9, however, on past labs patient's wbc has ranged from 11-11.8. Do not suspect inflammatory or infectious cause. Do not believe ESR is warranted at this time, patient denies fever, fatigue, vision loss, signs of PMR.   Suspect primary headache in nature, tension type after patient's H&P, laboratory data, and resolution of symptoms after benadryl 25 mg, toradol 30 mg, 1L LR Bolus, mag sulfate 2 g, and reglan 10 mg . She later notes that she felt knots starting to appear in her trapezius  area. Patient's symptoms are 97% resolved and do not believe imaging is warranted at this time.   Low suspicion for secondary headache - subarachnoid hemorrhage, giant cell arteritis, pseudotumor cerebri, intracranial tumor or aneurysm based on patient's H&P, lab results, and response to treatment while in the ED.   Discussed with patient need to  follow up with PCP for further tension headache treatment. Also instructed patient to return to ED if she has any worsening headaches or sharp headaches or new symptoms. She agrees with plan and return precautions.      Final Clinical Impression(s) / ED Diagnoses Final diagnoses:  Tension headache    Rx / DC Orders ED Discharge Orders    None       Riesa Pope, MD 05/02/20 [place], Wenda Overland, MD 05/06/20 [phone]

## 2020-05-20 ENCOUNTER — Other Ambulatory Visit: Payer: Self-pay | Admitting: Internal Medicine

## 2020-05-20 DIAGNOSIS — Z1231 Encounter for screening mammogram for malignant neoplasm of breast: Secondary | ICD-10-CM

## 2020-07-19 ENCOUNTER — Encounter: Payer: Self-pay | Admitting: Internal Medicine

## 2020-07-19 ENCOUNTER — Ambulatory Visit: Payer: Self-pay | Attending: Internal Medicine | Admitting: Internal Medicine

## 2020-07-19 ENCOUNTER — Other Ambulatory Visit: Payer: Self-pay

## 2020-07-19 VITALS — BP 135/82 | HR 89 | Resp 16 | Wt 289.2 lb

## 2020-07-19 DIAGNOSIS — Z6841 Body Mass Index (BMI) 40.0 and over, adult: Secondary | ICD-10-CM

## 2020-07-19 DIAGNOSIS — Z1159 Encounter for screening for other viral diseases: Secondary | ICD-10-CM

## 2020-07-19 DIAGNOSIS — Z23 Encounter for immunization: Secondary | ICD-10-CM

## 2020-07-19 DIAGNOSIS — L309 Dermatitis, unspecified: Secondary | ICD-10-CM

## 2020-07-19 DIAGNOSIS — E66813 Obesity, class 3: Secondary | ICD-10-CM

## 2020-07-19 DIAGNOSIS — Z87891 Personal history of nicotine dependence: Secondary | ICD-10-CM

## 2020-07-19 DIAGNOSIS — R030 Elevated blood-pressure reading, without diagnosis of hypertension: Secondary | ICD-10-CM

## 2020-07-19 DIAGNOSIS — M79672 Pain in left foot: Secondary | ICD-10-CM

## 2020-07-19 MED ORDER — TRIAMCINOLONE ACETONIDE 0.1 % EX CREA: 1.0000 "application " | TOPICAL_CREAM | Freq: Two times a day (BID) | CUTANEOUS | 0 refills | Status: DC

## 2020-07-19 NOTE — Progress Notes (Signed)
Patient ID: Kathy Conway, female    DOB: 15-Jun-1976  MRN: [id number]  CC: Follow-up   Subjective: Kathy Conway is a 44 y.o. female who presents for chronic ds management.  Last seen 2019 Her concerns today include:  Pt with hx of obesity, chronic LBP, asthma,anxiety, vertigo, mood disorder, tob depand bipolar  C/o pain and discoloration of LT 5th toe x 1 mth, pain radiates up leg.  Shooting pain that comes and goes.  Side of foot swollen in mornings..Worse after standing at work for long periods.  No trauma to the foot.  She thinks the issue started about a year ago when she had purchased a pair shoes off of [employer] that was tight fitting at the toes.  She stopped wearing the shoes but the pain persisted for quite a while but eventually went away.  Came back about a month ago.  Obesity:  Eating 1-2 small meals a day.  Has a health bar for BF.  Drinks mainly water.  She has cut back on sodas.  Eats fruits every day sometimes canned fruits.    Tob free for over 1 yr  HM:  Wants COVID vaccine.  Due for flu shot.  Wants Hep C screen Patient Active Problem List   Diagnosis Date Noted  . 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 Class 3 severe obesity due to excess calories without serious comorbidity with body mass index (BMI) of 40.0 to 44.9 in adult ([facility]) 08/06/2017  . History of gestational diabetes 04/17/2017  . Bilateral hearing loss 03/24/2017  . Acute pain of right foot 03/24/2017  . Insomnia 03/05/2017  . Plantar fasciitis, left 10/23/2016  . Chronic bilateral low back pain with bilateral sciatica 09/08/2016  . IBS (irritable bowel syndrome) 05/23/2016  . Asthma   . Boils 03/10/2016  . Anxiety state 03/10/2016  . H/O degenerative disc disease   . Fibromyalgia   . Migraine 10/09/1996     Current Outpatient Medications on File Prior to Visit  Medication Sig Dispense Refill  . cariprazine (VRAYLAR) capsule Take by mouth.    . diclofenac (VOLTAREN) 75 MG EC tablet Take  1 tablet (75 mg total) by mouth 2 (two) times daily. (Patient not taking: Reported on 12/06/2017) 30 tablet 0  . divalproex (DEPAKOTE) 500 MG DR tablet Take 500 mg by mouth 3 (three) times daily.     . DULoxetine (CYMBALTA) 30 MG capsule Take 1 capsule (30 mg total) by mouth daily. (Patient not taking: Reported on 05/23/2018) 30 capsule 3  . ibuprofen (ADVIL,MOTRIN) 600 MG tablet Take 1 tablet (600 mg total) by mouth every 8 (eight) hours as needed. 60 tablet 0  . methocarbamol (ROBAXIN) 500 MG tablet Take 1 tablet (500 mg total) by mouth at bedtime as needed for muscle spasms. 20 tablet 0  . SUMAtriptan (IMITREX) 50 MG tablet Take 1 take at start of headache.  May repeat in 2 hrs if no relief.  Max 100 mg/24 hr (Patient not taking: Reported on 03/14/2018) 10 tablet 1  . topiramate (TOPAMAX) 25 MG tablet Take 1 tablet (25 mg total) by mouth at bedtime. 30 tablet 0  . traZODone (DESYREL) 150 MG tablet Take by mouth at bedtime. 1-2 tablets at bedtime prn     No current facility-administered medications on file prior to visit.    Allergies  Allergen Reactions  . Codeine Shortness Of Breath  . Iodine     Hives  . Latex Itching, Swelling and Rash  Social History   Socioeconomic History  . Marital status: Married    Spouse name: Not on file  . Number of children: Not on file  . Years of education: Not on file  . Highest education level: Not on file  Occupational History  . Not on file  Tobacco Use  . Smoking status: Current Every Day Smoker    Packs/day: 0.50    Types: Cigarettes  . Smokeless tobacco: Never Used  Substance and Sexual Activity  . Alcohol use: No  . Drug use: No  . Sexual activity: Yes    Birth control/protection: Surgical  Other Topics Concern  . Not on file  Social History Narrative  . Not on file   Social Determinants of Health   Financial Resource Strain:   . Difficulty of Paying Living Expenses: Not on file  Food Insecurity:   . Worried About Patent examiner in the Last Year: Not on file  . Ran Out of Food in the Last Year: Not on file  Transportation Needs:   . Lack of Transportation (Medical): Not on file  . Lack of Transportation (Non-Medical): Not on file  Physical Activity:   . Days of Exercise per Week: Not on file  . Minutes of Exercise per Session: Not on file  Stress:   . Feeling of Stress : Not on file  Social Connections:   . Frequency of Communication with Friends and Family: Not on file  . Frequency of Social Gatherings with Friends and Family: Not on file  . Attends Religious Services: Not on file  . Active Member of Clubs or Organizations: Not on file  . Attends Banker Meetings: Not on file  . Marital Status: Not on file  Intimate Partner Violence:   . Fear of Current or Ex-Partner: Not on file  . Emotionally Abused: Not on file  . Physically Abused: Not on file  . Sexually Abused: Not on file    Family History  Problem Relation Age of Onset  . Cancer Mother   . Cancer Father   . Diabetes Maternal Grandfather     Past Surgical History:  Procedure Laterality Date  . ABDOMINAL HYSTERECTOMY    . CHOLECYSTECTOMY    . TUBAL LIGATION      ROS: Review of Systems Skin: Complains of intermittent itching and fine bumps on the left forearm times several weeks.  No known initiating factors.  Does not use perfume on the wrist.  PHYSICAL EXAM: BP 135/82   Pulse 89   Resp 16   Wt 289 lb 3.2 oz (131.2 kg)   SpO2 97%   BMI 48.13 kg/m   Wt Readings from Last 3 Encounters:  07/19/20 289 lb 3.2 oz (131.2 kg)  05/01/20 (!) 288 lb (130.6 kg)  05/01/20 (!) 308 lb 10.3 oz (140 kg)   Repeat BP 133/90 Physical Exam  General appearance - alert, well appearing, and in no distress Mental status - normal mood, behavior, speech, dress, motor activity, and thought processes Neck - supple, no significant adenopathy Chest - clear to auscultation, no wheezes, rales or rhonchi, symmetric air entry Heart -  normal rate, regular rhythm, normal S1, S2, no murmurs, rubs, clicks or gallops Musculoskeletal -left foot: No edema or erythema seen.  Mild point tenderness at the base of the left fifth toe.  Good range of motion of the ankle.   Extremities - peripheral pulses normal, no pedal edema, no clubbing or cyanosis Skin: Few fine papular  bumps noted on the palmar surface of the left forearm.  CMP Latest Ref Rng & Units 05/01/2020 05/23/2018 05/12/2016  Glucose 70 - 99 mg/dL 371(I) 967(E) 938(B)  BUN 6 - 20 mg/dL 13 11 14   Creatinine 0.44 - 1.00 mg/dL 0.17 5.10  Sodium 135 - 145 mmol/L 142 141 139  Potassium 3.5 - 5.1 mmol/L 3.7 4.2 4.0  Chloride 98 - 111 mmol/L 106 109 108  CO2 22 - 32 mmol/L 25 24 26   Calcium 8.9 - 10.3 mg/dL 9.8 2.58) 8.9  Total Protein 6.5 - 8.1 g/dL 6.0(L) 5.4(L) 5.3(L)  Total Bilirubin 0.3 - 1.2 mg/dL 0.5 0.4 0.3  Alkaline Phos 38 - 126 U/L 62 43 57  AST 15 - 41 U/L 17 17 20   ALT 0 - 44 U/L 21 12 14    Lipid Panel  No results found for: CHOL, TRIG, HDL, CHOLHDL, VLDL, LDLCALC, LDLDIRECT  CBC    Component Value Date/Time   WBC 11.9 (H) 05/01/2020 0022   RBC 4.28 05/01/2020 0022   HGB 13.2 05/01/2020 0022   HCT 38.6 05/01/2020 0022   PLT 251 05/01/2020 0022   MCV 90.2 05/01/2020 0022   MCH 30.8 05/01/2020 0022   MCHC 34.2 05/01/2020 0022   RDW 13.0 05/01/2020 0022   LYMPHSABS 2.6 05/01/2020 0022   MONOABS 0.5 05/01/2020 0022   EOSABS 0.4 05/01/2020 0022   BASOSABS 0.1 05/01/2020 0022    ASSESSMENT AND PLAN: 1. Class 3 severe obesity due to excess calories without serious comorbidity with body mass index (BMI) of 45.0 to 49.9 in adult [facility]) Discussed and encourage healthy eating habits.  Encouraged her to eat fresh fruits rather than canned foods.  Encouraged her to move as much as she can.  Printed information given to her on healthy eating habits. - Hemoglobin A1c  2. Elevated blood pressure reading DASH diet discussed and encouraged.  We will have her  follow-up with the clinical pharmacist in 1 month for repeat blood pressure check.  3. Foot pain, left Questionable etiology.  We will get an x-ray to rule out any occult fracture at the base of the left fifth toe versus arthritis.  In the meantime she can use some NSAIDs as needed - DG Foot Complete Left; Future  4. Former tobacco use Commended her on quitting.  Encouraged her to remain tobacco free  5. Dermatitis - triamcinolone cream (KENALOG) 0.1 %; Apply 1 application topically 2 (two) times daily.  Dispense: 30 g; Refill: 0  6. Need for hepatitis C screening test - Hepatitis C Antibody  7. Need for immunization against influenza Given today. - Flu Vaccine QUAD 36+ mos IM  Patient was given the opportunity to ask questions.  Patient verbalized understanding of the plan and was able to repeat key elements of the plan.   Orders Placed This Encounter  Procedures  . DG Foot Complete Left  . Flu Vaccine QUAD 36+ mos IM  . Hepatitis C Antibody  . Hemoglobin A1c     Requested Prescriptions   Signed Prescriptions Disp Refills  . triamcinolone cream (KENALOG) 0.1 % 30 g 0    Sig: Apply 1 application topically 2 (two) times daily.    Return for [facility] in 4 wks for blood pressure check.  05/03/2020, MD, FACP

## 2020-07-19 NOTE — Patient Instructions (Addendum)
Obesity, Adult Obesity is having too much body fat. Being obese means that your weight is more than what is healthy for you. BMI is a number that explains how much body fat you have. If you have a BMI of 30 or more, you are obese. Obesity is often caused by eating or drinking more calories than your body uses. Changing your lifestyle can help you lose weight. Obesity can cause serious health problems, such as:  Stroke.  Coronary artery disease (CAD).  Type 2 diabetes.  Some types of cancer, including cancers of the colon, breast, uterus, and gallbladder.  Osteoarthritis.  High blood pressure (hypertension).  High cholesterol.  Sleep apnea.  Gallbladder stones.  Infertility problems. What are the causes?  Eating meals each day that are high in calories, sugar, and fat.  Being born with genes that may make you more likely to become obese.  Having a medical condition that causes obesity.  Taking certain medicines.  Sitting a lot (having a sedentary lifestyle).  Not getting enough sleep.  Drinking a lot of drinks that have sugar in them. What increases the risk?  Having a family history of obesity.  Being an African American woman.  Being a Hispanic man.  Living in an area with limited access to: ? Parks, recreation centers, or sidewalks. ? Healthy food choices, such as grocery stores and farmers' markets. What are the signs or symptoms? The main sign is having too much body fat. How is this treated?  Treatment for this condition often includes changing your lifestyle. Treatment may include: ? Changing your diet. This may include making a healthy meal plan. ? Exercise. This may include activity that causes your heart to beat faster (aerobic exercise) and strength training. Work with your doctor to design a program that works for you. ? Medicine to help you lose weight. This may be used if you are not able to lose 1 pound a week after 6 weeks of healthy eating and  more exercise. ? Treating conditions that cause the obesity. ? Surgery. Options may include gastric banding and gastric bypass. This may be done if:  Other treatments have not helped to improve your condition.  You have a BMI of 40 or higher.  You have life-threatening health problems related to obesity. Follow these instructions at home: Eating and drinking   Follow advice from your doctor about what to eat and drink. Your doctor may tell you to: ? Limit fast food, sweets, and processed snack foods. ? Choose low-fat options. For example, choose low-fat milk instead of whole milk. ? Eat 5 or more servings of fruits or vegetables each day. ? Eat at home more often. This gives you more control over what you eat. ? Choose healthy foods when you eat out. ? Learn to read food labels. This will help you learn how much food is in 1 serving. ? Keep low-fat snacks available. ? Avoid drinks that have a lot of sugar in them. These include soda, fruit juice, iced tea with sugar, and flavored milk.  Drink enough water to keep your pee (urine) pale yellow.  Do not go on fad diets. Physical activity  Exercise often, as told by your doctor. Most adults should get up to 150 minutes of moderate-intensity exercise every week.Ask your doctor: ? What types of exercise are safe for you. ? How often you should exercise.  Warm up and stretch before being active.  Do slow stretching after being active (cool down).  Rest between   times of being active. Lifestyle  Work with your doctor and a food expert (dietitian) to set a weight-loss goal that is best for you.  Limit your screen time.  Find ways to reward yourself that do not involve food.  Do not drink alcohol if: ? Your doctor tells you not to drink. ? You are pregnant, may be pregnant, or are planning to become pregnant.  If you drink alcohol: ? Limit how much you use to:  0-1 drink a day for women.  0-2 drinks a day for men. ? Be  aware of how much alcohol is in your drink. In the [place] one drink equals one 12 oz bottle of beer (355 mL), one 5 oz glass of wine (148 mL), or one 1 oz glass of hard liquor (44 mL). General instructions  Keep a weight-loss journal. This can help you keep track of: ? The food that you eat. ? How much exercise you get.  Take over-the-counter and prescription medicines only as told by your doctor.  Take vitamins and supplements only as told by your doctor.  Think about joining a support group.  Keep all follow-up visits as told by your doctor. This is important. Contact a doctor if:  You cannot meet your weight loss goal after you have changed your diet and lifestyle for 6 weeks. Get help right away if you:  Are having trouble breathing.  Are having thoughts of harming yourself. Summary  Obesity is having too much body fat.  Being obese means that your weight is more than what is healthy for you.  Work with your doctor to set a weight-loss goal.  Get regular exercise as told by your doctor. This information is not intended to replace advice given to you by your health care provider. Make sure you discuss any questions you have with your health care provider. Document Revised: 05/30/2018 Document Reviewed: 05/30/2018 Elsevier Patient Education  2020 Elsevier Inc.  Influenza Virus Vaccine injection (Fluarix) What is this medicine? INFLUENZA VIRUS VACCINE (in floo EN zuh VAHY ruhs vak SEEN) helps to reduce the risk of getting influenza also known as the flu. This medicine may be used for other purposes; ask your health care provider or pharmacist if you have questions. COMMON BRAND NAME(S): Fluarix, Fluzone What should I tell my health care provider before I take this medicine? They need to know if you have any of these conditions:  bleeding disorder like hemophilia  fever or infection  Guillain-Barre syndrome or other neurological problems  immune system  problems  infection with the human immunodeficiency virus (HIV) or AIDS  low blood platelet counts  multiple sclerosis  an unusual or allergic reaction to influenza virus vaccine, eggs, chicken proteins, latex, gentamicin, other medicines, foods, dyes or preservatives  pregnant or trying to get pregnant  breast-feeding How should I use this medicine? This vaccine is for injection into a muscle. It is given by a health care professional. A copy of Vaccine Information Statements will be given before each vaccination. Read this sheet carefully each time. The sheet may change frequently. Talk to your pediatrician regarding the use of this medicine in children. Special care may be needed. Overdosage: If you think you have taken too much of this medicine contact a poison control center or emergency room at once. NOTE: This medicine is only for you. Do not share this medicine with others. What if I miss a dose? This does not apply. What may interact with this medicine?  chemotherapy  or radiation therapy  medicines that lower your immune system like etanercept, anakinra, infliximab, and adalimumab  medicines that treat or prevent blood clots like warfarin  phenytoin  steroid medicines like prednisone or cortisone  theophylline  vaccines This list may not describe all possible interactions. Give your health care provider a list of all the medicines, herbs, non-prescription drugs, or dietary supplements you use. Also tell them if you smoke, drink alcohol, or use illegal drugs. Some items may interact with your medicine. What should I watch for while using this medicine? Report any side effects that do not go away within 3 days to your doctor or health care professional. Call your health care provider if any unusual symptoms occur within 6 weeks of receiving this vaccine. You may still catch the flu, but the illness is not usually as bad. You cannot get the flu from the vaccine. The  vaccine will not protect against colds or other illnesses that may cause fever. The vaccine is needed every year. What side effects may I notice from receiving this medicine? Side effects that you should report to your doctor or health care professional as soon as possible:  allergic reactions like skin rash, itching or hives, swelling of the face, lips, or tongue Side effects that usually do not require medical attention (report to your doctor or health care professional if they continue or are bothersome):  fever  headache  muscle aches and pains  pain, tenderness, redness, or swelling at site where injected  weak or tired This list may not describe all possible side effects. Call your doctor for medical advice about side effects. You may report side effects to FDA at 1-800-FDA-1088. Where should I keep my medicine? This vaccine is only given in a clinic, pharmacy, doctor's office, or other health care setting and will not be stored at home. NOTE: This sheet is a summary. It may not cover all possible information. If you have questions about this medicine, talk to your doctor, pharmacist, or health care provider.  2020 Elsevier/Gold Standard (2008-04-22 09:30:40)

## 2020-07-20 LAB — HEPATITIS C ANTIBODY: Hep C Virus Ab: <0.1 s/co ratio (ref 0.0–0.9)

## 2020-07-20 LAB — HEMOGLOBIN A1C
Est. average glucose Bld gHb Est-mCnc: 123 mg/dL
Hgb A1c MFr Bld: 5.9 % — ABNORMAL HIGH (ref 4.8–5.6)

## 2020-07-20 MED FILL — TRIAMCINOLONE 0.1% CREAM: 0.1 | 7 days supply | Qty: 30 | Fill #0

## 2020-08-16 ENCOUNTER — Ambulatory Visit: Payer: Self-pay | Admitting: Pharmacist

## 2020-10-20 ENCOUNTER — Encounter (HOSPITAL_COMMUNITY): Payer: Self-pay

## 2020-10-20 ENCOUNTER — Emergency Department (HOSPITAL_COMMUNITY)
Admission: EM | Admit: 2020-10-20 | Discharge: 2020-10-21 | Disposition: A | Discharge status: Home / Self Care | Payer: HRSA Program | Attending: Emergency Medicine | Admitting: Emergency Medicine

## 2020-10-20 ENCOUNTER — Emergency Department (HOSPITAL_COMMUNITY): Payer: HRSA Program

## 2020-10-20 ENCOUNTER — Other Ambulatory Visit: Payer: Self-pay

## 2020-10-20 DIAGNOSIS — R0602 Shortness of breath: Secondary | ICD-10-CM | POA: Diagnosis present

## 2020-10-20 DIAGNOSIS — Z91040 Latex allergy status: Secondary | ICD-10-CM | POA: Insufficient documentation

## 2020-10-20 DIAGNOSIS — J45909 Unspecified asthma, uncomplicated: Secondary | ICD-10-CM | POA: Insufficient documentation

## 2020-10-20 DIAGNOSIS — F17210 Nicotine dependence, cigarettes, uncomplicated: Secondary | ICD-10-CM | POA: Diagnosis not present

## 2020-10-20 DIAGNOSIS — U071 COVID-19: Secondary | ICD-10-CM | POA: Diagnosis not present

## 2020-10-20 LAB — CBC
HCT: 40 % (ref 36.0–46.0)
Hemoglobin: 13.4 g/dL (ref 12.0–15.0)
MCH: 30 pg (ref 26.0–34.0)
MCHC: 33.5 g/dL (ref 30.0–36.0)
MCV: 89.5 fL (ref 80.0–100.0)
Platelets: 184 10*3/uL (ref 150–400)
RBC: 4.47 MIL/uL (ref 3.87–5.11)
RDW: 13.2 % (ref 11.5–15.5)
WBC: 6 10*3/uL (ref 4.0–10.5)
nRBC: 0 % (ref 0.0–0.2)

## 2020-10-20 LAB — BASIC METABOLIC PANEL
Anion gap: 11 (ref 5–15)
BUN: 13 mg/dL (ref 6–20)
CO2: 25 mmol/L (ref 22–32)
Calcium: 8.8 mg/dL — ABNORMAL LOW (ref 8.9–10.3)
Chloride: 102 mmol/L (ref 98–111)
Creatinine, Ser: 0.71 mg/dL (ref 0.44–1.00)
GFR, Estimated: >60 mL/min (ref >60)
Glucose, Bld: 137 mg/dL — ABNORMAL HIGH (ref 70–99)
Potassium: 4.3 mmol/L (ref 3.5–5.1)
Sodium: 138 mmol/L (ref 135–145)

## 2020-10-20 NOTE — ED Triage Notes (Signed)
Pt reports that her husband is covid +, she has been tested but does not know results, reports loss of taste and smell, SOB, congestion, cough, headache

## 2020-10-21 LAB — RESP PANEL BY RT-PCR (FLU A&B, COVID) ARPGX2
Influenza A by PCR: NEGATIVE
Influenza B by PCR: NEGATIVE
SARS Coronavirus 2 by RT PCR: POSITIVE — AB

## 2020-10-21 MED ORDER — ALBUTEROL SULFATE HFA 108 (90 BASE) MCG/ACT IN AERS
8.0000 | INHALATION_SPRAY | Freq: Once | RESPIRATORY_TRACT | Status: AC
  Administered 2020-10-21: 8 via RESPIRATORY_TRACT
  Filled 2020-10-21: qty 6.7

## 2020-10-21 MED ORDER — AEROCHAMBER PLUS FLO-VU LARGE MISC
Status: AC
  Filled 2020-10-21: qty 1

## 2020-10-21 MED ORDER — AEROCHAMBER PLUS FLO-VU LARGE MISC
1.0000 | Freq: Once | Status: AC
  Administered 2020-10-21: 1

## 2020-10-21 NOTE — ED Provider Notes (Signed)
[facility] EMERGENCY DEPARTMENT Provider Note   CSN: [id number] Arrival date & time: 10/20/20  2210     History Chief Complaint  Patient presents with  . Covid Exposure    Kathy Conway is a 45 y.o. female with a history of fibromyalgia, asthma, obesity, and IBS who presents the emergency department with a chief complaint of shortness of breath.  The patient developed URI symptoms on 01/09.  She reports that her husband tested positive for COVID-19 on January 8.  The patient is scheduled to get her second vaccine tomorrow.   She reports that she was initially having mild symptoms, but shortness of breath has progressively worsened along with fatigue.  Earlier today, she was having intermittent lightheadedness with exertion.  She reports diarrhea, but no vomiting.  She has an associated loss of sense of taste and smell, headache, nasal congestion.  Fevers have resolved.  She denies dysuria, chest pain, syncope, numbness, weakness, or seizure-like activity.  She has been treating her symptoms with over-the-counter cough and cold medicine with good improvement.  Kathy Conway was evaluated in Emergency Department on 10/21/2020 for the symptoms described in the history of present illness. She was evaluated in the context of the global COVID-19 pandemic, which necessitated consideration that the patient might be at risk for infection with the SARS-CoV-2 virus that causes COVID-19. Institutional protocols and algorithms that pertain to the evaluation of patients at risk for COVID-19 are in a state of rapid change based on information released by regulatory bodies including the CDC and federal and state organizations. These policies and algorithms were followed during the patient's care in the ED.     The history is provided by the patient and medical records. No language interpreter was used.       Past Medical History:  Diagnosis Date  . Arthritis   . Asthma 10/1993   . Fibromyalgia   . H/O degenerative disc disease   . IBS (irritable bowel syndrome) 10/2003  . Migraine 10/1996  . Renal disorder   . Sciatica   . Vertigo 10/2009    Patient Active Problem List   Diagnosis Date Noted  . 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 Class 3 severe obesity due to excess calories without serious comorbidity with body mass index (BMI) of 40.0 to 44.9 in adult (HCC) 08/06/2017  . History of gestational diabetes 04/17/2017  . Bilateral hearing loss 03/24/2017  . Acute pain of right foot 03/24/2017  . Insomnia 03/05/2017  . Plantar fasciitis, left 10/23/2016  . Chronic bilateral low back pain with bilateral sciatica 09/08/2016  . IBS (irritable bowel syndrome) 05/23/2016  . Asthma   . Boils 03/10/2016  . Anxiety state 03/10/2016  . H/O degenerative disc disease   . Fibromyalgia   . Migraine 10/09/1996    Past Surgical History:  Procedure Laterality Date  . ABDOMINAL HYSTERECTOMY    . CHOLECYSTECTOMY    . TUBAL LIGATION       OB History    Gravida  2   Para  2   Term      Preterm      AB      Living  2     SAB      IAB      Ectopic      Multiple      Live Births              Family History  Problem Relation Age of Onset  .  Cancer Mother   . Cancer Father   . Diabetes Maternal Grandfather     Social History   Tobacco Use  . Smoking status: Current Every Day Smoker    Packs/day: 0.50    Types: Cigarettes  . Smokeless tobacco: Never Used  Substance Use Topics  . Alcohol use: No  . Drug use: No    Home Medications Prior to Admission medications   Medication Sig Start Date End Date Taking? Authorizing Provider  cariprazine (VRAYLAR) capsule Take by mouth.    [provider]  diclofenac (VOLTAREN) 75 MG EC tablet Take 1 tablet (75 mg total) by mouth 2 (two) times daily. Patient not taking: Reported on 12/06/2017 03/22/17   Dessa PhiFunches, Josalyn, MD  divalproex (DEPAKOTE) 500 MG DR tablet Take 500 mg by  mouth 3 (three) times daily.     [provider]  DULoxetine (CYMBALTA) 30 MG capsule Take 1 capsule (30 mg total) by mouth daily. Patient not taking: Reported on 05/23/2018 12/06/17   Marcine MatarJohnson, Deborah B, MD  ibuprofen (ADVIL,MOTRIN) 600 MG tablet Take 1 tablet (600 mg total) by mouth every 8 (eight) hours as needed. 02/13/18   Marcine MatarJohnson, Deborah B, MD  methocarbamol (ROBAXIN) 500 MG tablet Take 1 tablet (500 mg total) by mouth at bedtime as needed for muscle spasms. 09/03/18   Maczis, Elmer SowMichael M, PA-C  SUMAtriptan (IMITREX) 50 MG tablet Take 1 take at start of headache.  May repeat in 2 hrs if no relief.  Max 100 mg/24 hr Patient not taking: Reported on 03/14/2018 02/13/18   Marcine MatarJohnson, Deborah B, MD  topiramate (TOPAMAX) 25 MG tablet Take 1 tablet (25 mg total) by mouth at bedtime. 10/18/19 11/17/19  Day, Ladona Ridgelaylor, MD  traZODone (DESYREL) 150 MG tablet Take by mouth at bedtime. 1-2 tablets at bedtime prn    [provider]  triamcinolone cream (KENALOG) 0.1 % Apply 1 application topically 2 (two) times daily. 07/19/20   Marcine MatarJohnson, Deborah B, MD    Allergies    Codeine, Iodine, and Latex  Review of Systems   Review of Systems  Constitutional: Positive for chills and fever. Negative for activity change.  HENT: Positive for congestion, rhinorrhea and sore throat. Negative for sinus pressure and sinus pain.   Eyes: Negative for visual disturbance.  Respiratory: Positive for cough and shortness of breath. Negative for wheezing.   Cardiovascular: Negative for chest pain.  Gastrointestinal: Positive for diarrhea. Negative for abdominal pain, constipation and vomiting.  Genitourinary: Negative for dysuria.  Musculoskeletal: Positive for myalgias. Negative for back pain, gait problem, neck pain and neck stiffness.  Skin: Negative for rash.  Allergic/Immunologic: Negative for immunocompromised state.  Neurological: Negative for dizziness, seizures, syncope, weakness, numbness and headaches.   Psychiatric/Behavioral: Negative for confusion.    Physical Exam Updated Vital Signs BP 126/82 (BP Location: Right Arm)   Pulse 90   Temp 98.4 F (36.9 C) (Oral)   Resp 15   SpO2 98%   Physical Exam Vitals and nursing note reviewed.  Constitutional:      General: She is not in acute distress.    Appearance: She is obese. She is not ill-appearing or toxic-appearing.     Comments: Well-appearing.  No acute distress.  HENT:     Head: Normocephalic.  Eyes:     Conjunctiva/sclera: Conjunctivae normal.  Cardiovascular:     Rate and Rhythm: Normal rate and regular rhythm.     Heart sounds: No murmur heard. No friction rub. No gallop.   Pulmonary:  Effort: Pulmonary effort is normal. No respiratory distress.     Breath sounds: No stridor. No wheezing, rhonchi or rales.     Comments: Lungs are clear to auscultation bilaterally.  No retractions or accessory muscle use.  Patient is able to speak in complete, fluent sentences without increased work of breathing. Chest:     Chest wall: No tenderness.  Abdominal:     General: There is no distension.     Palpations: Abdomen is soft. There is no mass.     Tenderness: There is no abdominal tenderness. There is no right CVA tenderness, left CVA tenderness, guarding or rebound.     Hernia: No hernia is present.  Musculoskeletal:     Cervical back: Neck supple.     Right lower leg: No edema.     Left lower leg: No edema.  Skin:    General: Skin is warm.     Findings: No rash.  Neurological:     Mental Status: She is alert.     Sensory: Sensory deficit:   Psychiatric:        Behavior: Behavior normal.     ED Results / Procedures / Treatments   Labs (all labs ordered are listed, but only abnormal results are displayed) Labs Reviewed  RESP PANEL BY RT-PCR (FLU A&B, COVID) ARPGX2 - Abnormal; Notable for the following components:      Result Value   SARS Coronavirus 2 by RT PCR POSITIVE (*)    All other components within normal  limits  BASIC METABOLIC PANEL - Abnormal; Notable for the following components:   Glucose, Bld 137 (*)    Calcium 8.8 (*)    All other components within normal limits  CBC    EKG None  Radiology DG Chest Portable 1 View  Result Date: 10/20/2020 CLINICAL DATA:  Short of breath EXAM: PORTABLE CHEST 1 VIEW COMPARISON:  04/10/2017 FINDINGS: The heart size and mediastinal contours are within normal limits. Both lungs are clear. The visualized skeletal structures are unremarkable. IMPRESSION: No active disease. Electronically Signed   By: Jasmine Pang M.D.   On: 10/20/2020 22:36    Procedures Procedures (including critical care time)  Medications Ordered in ED Medications  albuterol (VENTOLIN HFA) 108 (90 Base) MCG/ACT inhaler 8 puff (8 puffs Inhalation Given 10/21/20 0405)  AeroChamber Plus Flo-Vu Large MISC 1 each (1 each Other Given 10/21/20 0406)  AeroChamber Plus Flo-Vu Large MISC (  Given 10/21/20 0406)    ED Course  I have reviewed the triage vital signs and the nursing notes.  Pertinent labs & imaging results that were available during my care of the patient were reviewed by me and considered in my medical decision making (see chart for details).    MDM Rules/Calculators/A&P                          45 year old female with a history of fibromyalgia, asthma, obesity, and IBS presenting with URI symptoms that began on 01/09.  Her husband tested positive for COVID-19 on 01/08.  She is scheduled for her second dose of the [employer] vaccine tomorrow.  She presents with worsening lightheadedness and shortness of breath.  No vomiting.  Oxygen saturation 95 to 98% at rest.  Vital signs are otherwise unremarkable.  Labs and imaging have been reviewed and independently interpreted by me.  COVID-19 test is positive.  She has no metabolic derangements.  Chest x-ray is unremarkable.  Doubt bacterial pneumonia, PE, tension  pneumothorax, or influenza.  Will give albuterol inhaler and  ambulate patient to make sure that she is not symptomatic or hypoxic.  Patient reports marked improvement after receiving albuterol inhaler.Oxygen saturations above 96% with ambulation and patient was asymptomatic.  Will place consult for outpatient COVID-19 follow-up as patient may benefit from remdesivir infusion.  She will be discharged home with albuterol inhaler.  ER return precautions given.  She was counseled on CDC COVID-19 guidelines and follow-up for second vaccine.  All questions answered.  She is hemodynamically stable no acute distress.  Safe for discharge home with outpatient follow-up as indicated.  Final Clinical Impression(s) / ED Diagnoses Final diagnoses:  None    Rx / DC Orders ED Discharge Orders    None       Barkley Boards, PA-C 10/21/20 0556    Zadie Rhine, MD 10/22/20 [phone]

## 2020-10-21 NOTE — ED Notes (Addendum)
Pt ambulatory w no assistance, NAD noted. O2 sats remain above 96%, pt states she feels her chest is "wide open" after albuterol inhaler. PA notified

## 2020-10-21 NOTE — Discharge Instructions (Addendum)
Thank you for allowing me to care for you today in the Emergency Department.   You tested positive for COVID-19 today.  Per CDC guidelines, you need to quarantine at home for a total of at least 5 days (Friday).  If your symptoms are improving, you no longer need to quarantine, but you need to wear a mask in public for days 5 through 10.  If your symptoms are worsening or if you start having fevers, you need to quarantine at home until your symptoms start improving or until you have been fever free for at least 24 hours without taking fever reducing medications.  I have sent a referral to our outpatient COVID-19 clinics you may follow-up with you regarding your visit today.  Use 2 to 4 puffs of the albuterol inhaler with a spacer every 4-6 hours as needed for chest tightness or shortness of breath.  You can continue to use over-the-counter cough and cold medication as prescribed on the label.  You may not receive the second dose of the COVID-19 vaccine, per CDC guidelines, until all of your current symptoms have resolved.  You must be also out of the quarantine (5 days if you are not having fevers and as long as your symptoms are improving).  If you continue to have fever or worsening symptoms, you must quarantine for up to 10 days.  If your symptoms are improving at day 5, you must remain mask up days 5 through 10 if you around other people.  Return to the emergency department if you pass out, if your fingers or lips turn blue, if you develop respiratory distress, uncontrollable vomiting and diarrhea, if you become very sleepy and hard to wake up, or develop other new, concerning symptoms.

## 2021-01-16 ENCOUNTER — Emergency Department (HOSPITAL_COMMUNITY): Payer: Self-pay

## 2021-01-16 ENCOUNTER — Other Ambulatory Visit: Payer: Self-pay

## 2021-01-16 ENCOUNTER — Emergency Department (HOSPITAL_COMMUNITY)
Admission: EM | Admit: 2021-01-16 | Discharge: 2021-01-16 | Disposition: A | Discharge status: Home / Self Care | Payer: Self-pay | Attending: Emergency Medicine | Admitting: Emergency Medicine

## 2021-01-16 ENCOUNTER — Encounter (HOSPITAL_COMMUNITY): Payer: Self-pay

## 2021-01-16 DIAGNOSIS — R110 Nausea: Secondary | ICD-10-CM | POA: Insufficient documentation

## 2021-01-16 DIAGNOSIS — F17210 Nicotine dependence, cigarettes, uncomplicated: Secondary | ICD-10-CM | POA: Insufficient documentation

## 2021-01-16 DIAGNOSIS — Z91040 Latex allergy status: Secondary | ICD-10-CM | POA: Insufficient documentation

## 2021-01-16 DIAGNOSIS — J45909 Unspecified asthma, uncomplicated: Secondary | ICD-10-CM | POA: Insufficient documentation

## 2021-01-16 DIAGNOSIS — R109 Unspecified abdominal pain: Secondary | ICD-10-CM | POA: Insufficient documentation

## 2021-01-16 LAB — CBC
HCT: 38.4 % (ref 36.0–46.0)
Hemoglobin: 13.1 g/dL (ref 12.0–15.0)
MCH: 30.6 pg (ref 26.0–34.0)
MCHC: 34.1 g/dL (ref 30.0–36.0)
MCV: 89.7 fL (ref 80.0–100.0)
Platelets: 235 10*3/uL (ref 150–400)
RBC: 4.28 MIL/uL (ref 3.87–5.11)
RDW: 13.2 % (ref 11.5–15.5)
WBC: 12.2 10*3/uL — ABNORMAL HIGH (ref 4.0–10.5)
nRBC: 0 % (ref 0.0–0.2)

## 2021-01-16 LAB — URINALYSIS, ROUTINE W REFLEX MICROSCOPIC
Bacteria, UA: NONE SEEN
Bilirubin Urine: NEGATIVE
Glucose, UA: NEGATIVE mg/dL
Ketones, ur: NEGATIVE mg/dL
Leukocytes,Ua: NEGATIVE
Nitrite: NEGATIVE
Protein, ur: NEGATIVE mg/dL
Specific Gravity, Urine: 1.018 (ref 1.005–1.030)
pH: 6 (ref 5.0–8.0)

## 2021-01-16 LAB — BASIC METABOLIC PANEL
Anion gap: 7 (ref 5–15)
BUN: 11 mg/dL (ref 6–20)
CO2: 25 mmol/L (ref 22–32)
Calcium: 9.1 mg/dL (ref 8.9–10.3)
Chloride: 104 mmol/L (ref 98–111)
Creatinine, Ser: 0.77 mg/dL (ref 0.44–1.00)
GFR, Estimated: >60 mL/min (ref >60)
Glucose, Bld: 126 mg/dL — ABNORMAL HIGH (ref 70–99)
Potassium: 4.3 mmol/L (ref 3.5–5.1)
Sodium: 136 mmol/L (ref 135–145)

## 2021-01-16 LAB — I-STAT BETA HCG BLOOD, ED (MC, WL, AP ONLY): I-stat hCG, quantitative: <5 m[IU]/mL (ref <5)

## 2021-01-16 MED ORDER — FENTANYL CITRATE (PF) 100 MCG/2ML IJ SOLN
50.0000 ug | Freq: Once | INTRAMUSCULAR | Status: AC
  Administered 2021-01-16: 50 ug via INTRAVENOUS
  Filled 2021-01-16: qty 2

## 2021-01-16 MED ORDER — MAGNESIUM CITRATE PO SOLN
1.0000 | Freq: Once | ORAL | Status: AC
  Administered 2021-01-16: 1 via ORAL
  Filled 2021-01-16: qty 296

## 2021-01-16 MED ORDER — ONDANSETRON HCL 4 MG/2ML IJ SOLN
4.0000 mg | Freq: Once | INTRAMUSCULAR | Status: AC
  Administered 2021-01-16: 4 mg via INTRAVENOUS
  Filled 2021-01-16: qty 2

## 2021-01-16 MED ORDER — ONDANSETRON HCL 4 MG PO TABS: 4.0000 mg | ORAL_TABLET | Freq: Four times a day (QID) | ORAL | 0 refills | Status: DC | PRN

## 2021-01-16 MED ORDER — IOHEXOL 300 MG/ML  SOLN
100.0000 mL | Freq: Once | INTRAMUSCULAR | Status: AC | PRN
  Administered 2021-01-16: 100 mL via INTRAVENOUS

## 2021-01-16 NOTE — ED Provider Notes (Signed)
[facility] EMERGENCY DEPARTMENT Provider Note   CSN: [id number] Arrival date & time: 01/16/21  0056   History Chief Complaint  Patient presents with  . Flank Pain    Kathy Conway is a 45 y.o. female.  The history is provided by the patient.  Flank Pain  She has history of fibromyalgia, chronic back pain, irritable bowel syndrome and comes in complaining of left-sided abdominal pain and flank pain for the last 2 days.  During this time, she has noted that it has been hard for her to pass flatus or have a bowel movement.  There has been some nausea.  Pain is worse after eating and also worse after having a bowel movement.  There has been associated nausea without vomiting.  She denies fever, chills, sweats.  She denies any urinary difficulty.  She has taken ibuprofen and acetaminophen without relief.  Past Medical History:  Diagnosis Date  . Arthritis   . Asthma 10/1993  . Fibromyalgia   . H/O degenerative disc disease   . IBS (irritable bowel syndrome) 10/2003  . Migraine 10/1996  . Renal disorder   . Sciatica   . Vertigo 10/2009    Patient Active Problem List   Diagnosis Date Noted  . 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 Class 3 severe obesity due to excess calories without serious comorbidity with body mass index (BMI) of 40.0 to 44.9 in adult (HCC) 08/06/2017  . History of gestational diabetes 04/17/2017  . Bilateral hearing loss 03/24/2017  . Acute pain of right foot 03/24/2017  . Insomnia 03/05/2017  . Plantar fasciitis, left 10/23/2016  . Chronic bilateral low back pain with bilateral sciatica 09/08/2016  . IBS (irritable bowel syndrome) 05/23/2016  . Asthma   . Boils 03/10/2016  . Anxiety state 03/10/2016  . H/O degenerative disc disease   . Fibromyalgia   . Migraine 10/09/1996    Past Surgical History:  Procedure Laterality Date  . ABDOMINAL HYSTERECTOMY    . CHOLECYSTECTOMY    . TUBAL LIGATION       OB History    Gravida   2   Para  2   Term      Preterm      AB      Living  2     SAB      IAB      Ectopic      Multiple      Live Births              Family History  Problem Relation Age of Onset  . Cancer Mother   . Cancer Father   . Diabetes Maternal Grandfather     Social History   Tobacco Use  . Smoking status: Current Every Day Smoker    Packs/day: 0.50    Types: Cigarettes  . Smokeless tobacco: Never Used  Substance Use Topics  . Alcohol use: No  . Drug use: No    Home Medications Prior to Admission medications   Medication Sig Start Date End Date Taking? Authorizing Provider  ondansetron (ZOFRAN) 4 MG tablet Take 1 tablet (4 mg total) by mouth every 6 (six) hours as needed for nausea or vomiting. 01/16/21  Yes Dione Booze, MD  cariprazine [facility]) capsule Take by mouth.    [provider]  divalproex (DEPAKOTE) 500 MG DR tablet Take 500 mg by mouth 3 (three) times daily.     [provider]  ibuprofen (ADVIL,MOTRIN) 600 MG tablet Take  1 tablet (600 mg total) by mouth every 8 (eight) hours as needed. 02/13/18   Marcine Matar, MD  methocarbamol (ROBAXIN) 500 MG tablet Take 1 tablet (500 mg total) by mouth at bedtime as needed for muscle spasms. 09/03/18   Maczis, Elmer Sow, PA-C  topiramate (TOPAMAX) 25 MG tablet Take 1 tablet (25 mg total) by mouth at bedtime. 10/18/19 11/17/19  Day, Ladona Ridgel, MD  traZODone (DESYREL) 150 MG tablet Take by mouth at bedtime. 1-2 tablets at bedtime prn    [provider]  triamcinolone cream (KENALOG) 0.1 % Apply 1 application topically 2 (two) times daily. 07/19/20   Marcine Matar, MD  DULoxetine (CYMBALTA) 30 MG capsule Take 1 capsule (30 mg total) by mouth daily. Patient not taking: Reported on 05/23/2018 12/06/17 10/21/20  Marcine Matar, MD  SUMAtriptan (IMITREX) 50 MG tablet Take 1 take at start of headache.  May repeat in 2 hrs if no relief.  Max 100 mg/24 hr Patient not taking: Reported on 03/14/2018  02/13/18 10/21/20  Marcine Matar, MD    Allergies    Codeine, Iodine, and Latex  Review of Systems   Review of Systems  Genitourinary: Positive for flank pain.  All other systems reviewed and are negative.   Physical Exam Updated Vital Signs BP (!) 140/99   Pulse 91   Temp 97.9 F (36.6 C)   Resp 17   Ht 5\' 5"  (1.651 m)   Wt 129.7 kg   SpO2 98%   BMI 47.59 kg/m   Physical Exam Vitals and nursing note reviewed.   45 year old female, resting comfortably and in no acute distress. Vital signs are significant for elevated blood pressure. Oxygen saturation is 98%, which is normal. Head is normocephalic and atraumatic. PERRLA, EOMI. Oropharynx is clear. Neck is nontender and supple without adenopathy or JVD. Back is nontender and there is no CVA tenderness. Lungs are clear without rales, wheezes, or rhonchi. Chest is nontender. Heart has regular rate and rhythm without murmur. Abdomen is soft, flat, with moderate tenderness throughout the left left side of the abdomen.  There is no rebound or guarding.  There are no masses or hepatosplenomegaly and peristalsis is normoactive. Extremities have trace edema, full range of motion is present. Skin is warm and dry without rash. Neurologic: Mental status is normal, cranial nerves are intact, there are no motor or sensory deficits.  ED Results / Procedures / Treatments   Labs (all labs ordered are listed, but only abnormal results are displayed) Labs Reviewed  URINALYSIS, ROUTINE W REFLEX MICROSCOPIC - Abnormal; Notable for the following components:      Result Value   Hgb urine dipstick SMALL (*)    All other components within normal limits  BASIC METABOLIC PANEL - Abnormal; Notable for the following components:   Glucose, Bld 126 (*)    All other components within normal limits  CBC - Abnormal; Notable for the following components:   WBC 12.2 (*)    All other components within normal limits  I-STAT BETA HCG BLOOD, ED ([facility],  [facility], [facility] ONLY)   Radiology CT ABDOMEN PELVIS W CONTRAST  Result Date: 01/16/2021 CLINICAL DATA:  Abdominal pain. EXAM: CT ABDOMEN AND PELVIS WITH CONTRAST TECHNIQUE: Multidetector CT imaging of the abdomen and pelvis was performed using the standard protocol following bolus administration of intravenous contrast. CONTRAST:  03/18/2021 OMNIPAQUE IOHEXOL 300 MG/ML  SOLN COMPARISON:  May 12, 2016 FINDINGS: Lower chest: No acute abnormality. Hepatobiliary: There is diffuse  fatty infiltration of the liver parenchyma no focal liver abnormality is seen. Status post cholecystectomy. No biliary dilatation. Pancreas: Unremarkable. No pancreatic ductal dilatation or surrounding inflammatory changes. Spleen: Normal in size without focal abnormality. Adrenals/Urinary Tract: Adrenal glands are unremarkable. Kidneys are normal, without renal calculi, focal lesion, or hydronephrosis. Bladder is unremarkable. Stomach/Bowel: Stomach is within normal limits. Appendix appears normal. No evidence of bowel wall thickening, distention, or inflammatory changes. Small, noninflamed diverticula are seen within the proximal descending colon. Vascular/Lymphatic: Very mild aortic atherosclerosis. No enlarged abdominal or pelvic lymph nodes. Reproductive: Status post hysterectomy. No adnexal masses. Other: No abdominal wall hernia or abnormality. No abdominopelvic ascites. Musculoskeletal: No acute or significant osseous findings. IMPRESSION: 1. Hepatic steatosis. 2. Colonic diverticulosis. 3. Evidence of prior cholecystectomy and prior hysterectomy. 4. Aortic atherosclerosis. Aortic Atherosclerosis (ICD10-I70.0). Electronically Signed   By: Aram Candela M.D.   On: 01/16/2021 03:47    Procedures Procedures   Medications Ordered in ED Medications  magnesium citrate solution 1 Bottle (has no administration in time range)  fentaNYL (SUBLIMAZE) injection 50 mcg (50 mcg Intravenous Given 01/16/21 0206)  ondansetron (ZOFRAN) injection 4  mg (4 mg Intravenous Given 01/16/21 0206)  iohexol (OMNIPAQUE) 300 MG/ML solution 100 mL (100 mLs Intravenous Contrast Given 01/16/21 0319)    ED Course  I have reviewed the triage vital signs and the nursing notes.  Pertinent labs & imaging results that were available during my care of the patient were reviewed by me and considered in my medical decision making (see chart for details).  MDM Rules/Calculators/A&P Left-sided abdominal pain of uncertain cause.  Consider diverticulitis, urolithiasis, urinary tract infection.  Work-up ordered at triage included CBC, metabolic panel, urinalysis, CT of abdomen and pelvis.  Laboratory work-up was significant for mild leukocytosis.  Urinalysis is normal.  CT of abdomen and pelvis showed no acute process.  Incidental findings of diverticulosis without diverticulitis, hepatic steatosis, aortic atherosclerosis.  Old records are reviewed, and she had a prior ED visit for abdominal pain in 2017 at which time CT was also unremarkable.  I suspect this is a flareup of her irritable bowel syndrome.  She is given a dose of magnesium citrate, discharged with prescription for ondansetron and is referred to gastroenterology for further outpatient work-up.  Return precautions discussed.  Final Clinical Impression(s) / ED Diagnoses Final diagnoses:  Left sided abdominal pain    Rx / DC Orders ED Discharge Orders         Ordered    ondansetron (ZOFRAN) 4 MG tablet  Every 6 hours PRN        01/16/21 0429           Dione Booze, MD 01/16/21 [id number]

## 2021-01-16 NOTE — ED Notes (Signed)
Not in the bed at this time

## 2021-01-16 NOTE — ED Notes (Signed)
Provider at bedside at this time

## 2021-01-16 NOTE — ED Triage Notes (Signed)
Patient with L sided flank pain radiating into her abdomen and back, denies hx of stones, reports some constipation

## 2021-01-16 NOTE — ED Triage Notes (Signed)
Emergency Medicine Provider Triage Evaluation Note  Kathy Conway , a 45 y.o. female  was evaluated in triage.  Pt complains of LLQ pain which has been intermittent x 1 week. Waxing and waning in severity, but worsening overall. Pain radiates to the back/flank, but also down to her thigh. C/o associated nausea without vomiting. Stool has looked "dark like mud". No symptom improvement with tylenol, topical icing, ETOH use. Abdominal SHx notable for partial hysterectomy, cholecystectomy.  Review of Systems  Positive: Abdominal pain, nausea Negative: Hematuria, dysuria, hematochezia, vomiting, fevers  Physical Exam  BP (!) 152/102 (BP Location: Right Arm)   Pulse 100   Temp 98.4 F (36.9 C) (Oral)   Resp 19   Ht 5\' 5"  (1.651 m)   Wt 129.7 kg   SpO2 98%   BMI 47.59 kg/m  Gen:   Awake, appears uncomfortable HEENT:  Atraumatic  Resp:  Normal effort  Cardiac:  Normal rate  Abd:   Soft, obese abdomen with TTP in the L mid and lower abdomen. No peritoneal signs. Exam limited 2/2 habitus. MSK:   Moves extremities without difficulty  Neuro:  Speech clear   Medical Decision Making  Medically screening exam initiated at 1:50 AM.  Appropriate orders placed.  Kathy Conway was informed that the remainder of the evaluation will be completed by another provider, this initial triage assessment does not replace that evaluation, and the importance of remaining in the ED until their evaluation is complete.  Clinical Impression  Abdominal pain   [name], PA-C 01/16/21 0153

## 2021-01-16 NOTE — Discharge Instructions (Addendum)
Your blood tests and CT scan did not show the reason that to have been hurting.  Please follow-up with the gastroenterologist for further evaluation, return to the emergency department if symptoms are getting worse.

## 2021-04-04 ENCOUNTER — Other Ambulatory Visit: Payer: Self-pay | Admitting: Internal Medicine

## 2021-04-04 DIAGNOSIS — Z1231 Encounter for screening mammogram for malignant neoplasm of breast: Secondary | ICD-10-CM

## 2021-06-03 ENCOUNTER — Other Ambulatory Visit: Payer: Self-pay

## 2021-06-03 ENCOUNTER — Encounter: Payer: Self-pay | Admitting: Internal Medicine

## 2021-06-03 ENCOUNTER — Ambulatory Visit: Payer: BC Managed Care – PPO | Attending: Internal Medicine | Admitting: Internal Medicine

## 2021-06-03 DIAGNOSIS — R7303 Prediabetes: Secondary | ICD-10-CM

## 2021-06-03 DIAGNOSIS — Z6841 Body Mass Index (BMI) 40.0 and over, adult: Secondary | ICD-10-CM

## 2021-06-03 DIAGNOSIS — M4726 Other spondylosis with radiculopathy, lumbar region: Secondary | ICD-10-CM | POA: Diagnosis not present

## 2021-06-03 DIAGNOSIS — Z23 Encounter for immunization: Secondary | ICD-10-CM

## 2021-06-03 DIAGNOSIS — M5416 Radiculopathy, lumbar region: Secondary | ICD-10-CM

## 2021-06-03 DIAGNOSIS — R030 Elevated blood-pressure reading, without diagnosis of hypertension: Secondary | ICD-10-CM

## 2021-06-03 MED ORDER — GABAPENTIN 300 MG PO CAPS: ORAL_CAPSULE | ORAL | 2 refills | Status: DC

## 2021-06-03 MED ORDER — MELOXICAM 15 MG PO TABS: 15.0000 mg | ORAL_TABLET | Freq: Every day | ORAL | 2 refills | Status: DC

## 2021-06-03 NOTE — Patient Instructions (Signed)
You have been referred to medical weight management.  Healthy Eating Following a healthy eating pattern may help you to achieve and maintain a healthy body weight, reduce the risk of chronic disease, and live a long and productive life. It is important to follow a healthy eating pattern at an appropriate calorie level for your body. Your nutritional needs should be met primarily through food by choosing a variety of nutrient-rich foods. What are tips for following this plan? Reading food labels Read labels and choose the following: Reduced or low sodium. Juices with 100% fruit juice. Foods with low saturated fats and high polyunsaturated and monounsaturated fats. Foods with whole grains, such as whole wheat, cracked wheat, brown rice, and wild rice. Whole grains that are fortified with folic acid. This is recommended for women who are pregnant or who want to become pregnant. Read labels and avoid the following: Foods with a lot of added sugars. These include foods that contain brown sugar, corn sweetener, corn syrup, dextrose, fructose, glucose, high-fructose corn syrup, honey, invert sugar, lactose, malt syrup, maltose, molasses, raw sugar, sucrose, trehalose, or turbinado sugar. Do not eat more than the following amounts of added sugar per day: 6 teaspoons (25 g) for women. 9 teaspoons (38 g) for men. Foods that contain processed or refined starches and grains. Refined grain products, such as white flour, degermed cornmeal, white bread, and white rice. Shopping Choose nutrient-rich snacks, such as vegetables, whole fruits, and nuts. Avoid high-calorie and high-sugar snacks, such as potato chips, fruit snacks, and candy. Use oil-based dressings and spreads on foods instead of solid fats such as butter, stick margarine, or cream cheese. Limit pre-made sauces, mixes, and "instant" products such as flavored rice, instant noodles, and ready-made pasta. Try more plant-protein sources, such as  tofu, tempeh, black beans, edamame, lentils, nuts, and seeds. Explore eating plans such as the Mediterranean diet or vegetarian diet. Cooking Use oil to saut or stir-fry foods instead of solid fats such as butter, stick margarine, or lard. Try baking, boiling, grilling, or broiling instead of frying. Remove the fatty part of meats before cooking. Steam vegetables in water or broth. Meal planning  At meals, imagine dividing your plate into fourths: One-half of your plate is fruits and vegetables. One-fourth of your plate is whole grains. One-fourth of your plate is protein, especially lean meats, poultry, eggs, tofu, beans, or nuts. Include low-fat dairy as part of your daily diet. Lifestyle Choose healthy options in all settings, including home, work, school, restaurants, or stores. Prepare your food safely: Wash your hands after handling raw meats. Keep food preparation surfaces clean by regularly washing with hot, soapy water. Keep raw meats separate from ready-to-eat foods, such as fruits and vegetables. Cook seafood, meat, poultry, and eggs to the recommended internal temperature. Store foods at safe temperatures. In general: Keep cold foods at 40F (4.4C) or below. Keep hot foods at 140F (60C) or above. Keep your freezer at 0F (-17.8C) or below. Foods are no longer safe to eat when they have been between the temperatures of 40-140F (4.4-60C) for more than 2 hours. What foods should I eat? Fruits Aim to eat 2 cup-equivalents of fresh, canned (in natural juice), or frozen fruits each day. Examples of 1 cup-equivalent of fruit include 1 small apple, 8 large strawberries, 1 cup canned fruit,  cup dried fruit, or 1 cup 100% juice. Vegetables Aim to eat 2-3 cup-equivalents of fresh and frozen vegetables each day, including different varieties and colors. Examples of 1 cup-equivalent   of vegetables include 2 medium carrots, 2 cups raw, leafy greens, 1 cup chopped vegetable (raw  or cooked), or 1 medium baked potato. Grains Aim to eat 6 ounce-equivalents of whole grains each day. Examples of 1 ounce-equivalent of grains include 1 slice of bread, 1 cup ready-to-eat cereal, 3 cups popcorn, or  cup cooked rice, pasta, or cereal. Meats and other proteins Aim to eat 5-6 ounce-equivalents of protein each day. Examples of 1 ounce-equivalent of protein include 1 egg, 1/2 cup nuts or seeds, or 1 tablespoon (16 g) peanut butter. A cut of meat or fish that is the size of a deck of cards is about 3-4 ounce-equivalents. Of the protein you eat each week, try to have at least 8 ounces come from seafood. This includes salmon, trout, herring, and anchovies. Dairy Aim to eat 3 cup-equivalents of fat-free or low-fat dairy each day. Examples of 1 cup-equivalent of dairy include 1 cup (240 mL) milk, 8 ounces (250 g) yogurt, 1 ounces (44 g) natural cheese, or 1 cup (240 mL) fortified soy milk. Fats and oils Aim for about 5 teaspoons (21 g) per day. Choose monounsaturated fats, such as canola and olive oils, avocados, peanut butter, and most nuts, or polyunsaturated fats, such as sunflower, corn, and soybean oils, walnuts, pine nuts, sesame seeds, sunflower seeds, and flaxseed. Beverages Aim for six 8-oz glasses of water per day. Limit coffee to three to five 8-oz cups per day. Limit caffeinated beverages that have added calories, such as soda and energy drinks. Limit alcohol intake to no more than 1 drink a day for nonpregnant women and 2 drinks a day for men. One drink equals 12 oz of beer (355 mL), 5 oz of wine (148 mL), or 1 oz of hard liquor (44 mL). Seasoning and other foods Avoid adding excess amounts of salt to your foods. Try flavoring foods with herbs and spices instead of salt. Avoid adding sugar to foods. Try using oil-based dressings, sauces, and spreads instead of solid fats. This information is based on general [place] nutrition guidelines. For more information, visit  choosemyplate.gov. Exact amounts may vary based on your nutrition needs. Summary A healthy eating plan may help you to maintain a healthy weight, reduce the risk of chronic diseases, and stay active throughout your life. Plan your meals. Make sure you eat the right portions of a variety of nutrient-rich foods. Try baking, boiling, grilling, or broiling instead of frying. Choose healthy options in all settings, including home, work, school, restaurants, or stores. This information is not intended to replace advice given to you by your health care provider. Make sure you discuss any questions you have with your health care provider. Document Revised: 01/07/2018 Document Reviewed: 01/07/2018 Elsevier Patient Education  2022 [employer]  

## 2021-06-03 NOTE — Progress Notes (Signed)
Pt states her back pain radiates down to her legs to her feet

## 2021-06-03 NOTE — Progress Notes (Signed)
Patient ID: LEITH HEDLUND, female    DOB: [birth year]  MRN: [id number]  CC: Back Pain   Subjective: Kathy Rought is a 45 y.o. female who presents for back pain Her concerns today include:  Pt with hx of obesity, chronic LBP, asthma,anxiety, vertigo, mood disorder,  former tob dep and bipolar  Wanted to f/u on BP.  We had been keeping an eye on her blood pressure readings.  Elevated in the past but we had not started her on medications.  DASH diet was encouraged and she has been doing that.    PreDM/obesity:  cut back on sodas, drinks occasionally.  Cut out junk foods and eating smaller portions.  Down 10 lbs since I last saw her in 07/2020.  Taking a menopausal supplement that is helping with wgh loss. Also taking something called Liver Tonic that she purchased on [employer] to help detox the liver.  Works for [employer] now x 7 mths - constantly moving lifting and pushing.  She thinks this is contributed also to weight loss  Request referral to pain management for back pain -She has history of chronic lower back pain.  She had MRI done in 2019 for lumbar radicular symptoms.  However MRI was negative for herniated disc or nerve compression.  X-rays of the lower back showed minor spondylosis with end plate spurring. -She reports that her back pain has worsened over the past 7 months.  She attributes this to the type of work that she is doing.  She again reports radicular symptoms with pain across the lower back that radiates down both legs to the feet.  No numbness in the legs.  Denies any incontinence of bowel or bladder.  She had noted numbness in the groin for 1 day last week.  Pain is worse after she gets off work in the evenings.  Pain interferes with sleep.  She has stiffness in her back in the mornings which makes it difficult to get out of bed.  She feels she has nerve pain throughout her body.  She states she gets shooting pains not only in her legs but in her face and arms.  Currently not  taking anything for her pain.  HM:  due for flu shot.  Patient Active Problem List   Diagnosis Date Noted   Osteoarthritis of lumbar spine 08/06/2017   Class 3 severe obesity due to excess calories without serious comorbidity with body mass index (BMI) of 40.0 to 44.9 in adult (HCC) 08/06/2017   History of gestational diabetes 04/17/2017   Bilateral hearing loss 03/24/2017   Acute pain of [place] foot 03/24/2017   Insomnia 03/05/2017   Plantar fasciitis, left 10/23/2016   Chronic bilateral low back pain with bilateral sciatica 09/08/2016   IBS (irritable bowel syndrome) 05/23/2016   Asthma    Boils 03/10/2016   Anxiety state 03/10/2016   H/O degenerative disc disease    Fibromyalgia    Migraine 10/09/1996     Current Outpatient Medications on File Prior to Visit  Medication Sig Dispense Refill   ibuprofen (ADVIL,MOTRIN) 600 MG tablet Take 1 tablet (600 mg total) by mouth every 8 (eight) hours as needed. 60 tablet 0   methocarbamol (ROBAXIN) 500 MG tablet Take 1 tablet (500 mg total) by mouth at bedtime as needed for muscle spasms. 20 tablet 0   traZODone (DESYREL) 150 MG tablet Take by mouth at bedtime. 1-2 tablets at bedtime prn     [DISCONTINUED] DULoxetine (CYMBALTA) 30 MG capsule Take  1 capsule (30 mg total) by mouth daily. (Patient not taking: Reported on 05/23/2018) 30 capsule 3   [DISCONTINUED] SUMAtriptan (IMITREX) 50 MG tablet Take 1 take at start of headache.  May repeat in 2 hrs if no relief.  Max 100 mg/24 hr (Patient not taking: Reported on 03/14/2018) 10 tablet 1   No current facility-administered medications on file prior to visit.    Allergies  Allergen Reactions   Codeine Shortness Of Breath   Iodine     Hives   Latex Itching, Swelling and Rash    Social History   Socioeconomic History   Marital status: Married    Spouse name: Not on file   Number of children: Not on file   Years of education: Not on file   Highest education level: Not on file   Occupational History   Not on file  Tobacco Use   Smoking status: Every Day    Packs/day: 0.50    Types: Cigarettes   Smokeless tobacco: Never  Substance and Sexual Activity   Alcohol use: No   Drug use: No   Sexual activity: Yes    Birth control/protection: Surgical  Other Topics Concern   Not on file  Social History Narrative   Not on file   Social Determinants of Health   Financial Resource Strain: Not on file  Food Insecurity: Not on file  Transportation Needs: Not on file  Physical Activity: Not on file  Stress: Not on file  Social Connections: Not on file  Intimate Partner Violence: Not on file    Family History  Problem Relation Age of Onset   Cancer Mother    Cancer Father    Diabetes Maternal Grandfather     Past Surgical History:  Procedure Laterality Date   ABDOMINAL HYSTERECTOMY     CHOLECYSTECTOMY     TUBAL LIGATION      ROS: Review of Systems Negative except as stated above  PHYSICAL EXAM: BP 122/88   Pulse 89   Resp 16   Ht 5\' 6"  (1.676 m)   Wt 279 lb 9.6 oz (126.8 kg)   SpO2 96%   BMI 45.13 kg/m   Wt Readings from Last 3 Encounters:  06/03/21 279 lb 9.6 oz (126.8 kg)  01/16/21 286 lb (129.7 kg)  07/19/20 289 lb 3.2 oz (131.2 kg)    Physical Exam  General appearance - alert, well appearing, obese middle-age Caucasian female and in no distress Mental status - normal mood, behavior, speech, dress, motor activity, and thought processes Neck - supple, no significant adenopathy Chest - clear to auscultation, no wheezes, rales or rhonchi, symmetric air entry Heart - normal rate, regular rhythm, normal S1, S2, no murmurs, rubs, clicks or gallops Neurological -straight leg raise to about 40 degrees on both sides reproduces pain in the lower back.  Power in both lower extremities 5/5 bilaterally.  Gait appears normal. Musculoskeletal -no tenderness on palpation of the lumbar spine or surrounding paraspinal muscles. Extremities - peripheral  pulses normal, no pedal edema, no clubbing or cyanosis  CMP Latest Ref Rng & Units 01/16/2021 10/20/2020 05/01/2020  Glucose 70 - 99 mg/dL 05/03/2020) 939(Q) 300(P)  BUN 6 - 20 mg/dL 11 13 13   Creatinine 0.44 - 1.00 mg/dL 233(A 0.76  Sodium 135 - 145 mmol/L 136 138 142  Potassium 3.5 - 5.1 mmol/L 4.3 4.3 3.7  Chloride 98 - 111 mmol/L 104 102 106  CO2 22 - 32 mmol/L 25 25 25   Calcium 8.9 -  10.3 mg/dL 9.1 9.6(V) 9.8  Total Protein 6.5 - 8.1 g/dL - - 6.0(L)  Total Bilirubin 0.3 - 1.2 mg/dL - - 0.5  Alkaline Phos 38 - 126 U/L - - 62  AST 15 - 41 U/L - - 17  ALT 0 - 44 U/L - - 21   Lipid Panel  No results found for: CHOL, TRIG, HDL, CHOLHDL, VLDL, LDLCALC, LDLDIRECT  CBC    Component Value Date/Time   WBC 12.2 (H) 01/16/2021 0120   RBC 4.28 01/16/2021 0120   HGB 13.1 01/16/2021 0120   HCT 38.4 01/16/2021 0120   PLT 235 01/16/2021 0120   MCV 89.7 01/16/2021 0120   MCH 30.6 01/16/2021 0120   MCHC 34.1 01/16/2021 0120   RDW 13.2 01/16/2021 0120   LYMPHSABS 2.6 05/01/2020 0022   MONOABS 0.5 05/01/2020 0022   EOSABS 0.4 05/01/2020 0022   BASOSABS 0.1 05/01/2020 0022    ASSESSMENT AND PLAN: 1. Morbid obesity (HCC) Commended her on weight loss.  Dietary counseling given along with printed information.  Commended her on moving more.  Discussed medical weight management.  She is interested in pursuing this.  Referral submitted.  I recommend that she be careful in taking the supplements that she is purchasing off of [employer] that are not FDA regulated.  I suggest she stop taking the Liver Tonic - Amb Ref to Medical Weight Management  2. Prediabetes See #1 above  3. Elevated blood pressure reading Diastolic blood pressure remains a little elevated.  Continue DASH diet.  We will have her follow-up in several weeks for repeat blood pressure check  4. Lumbar radiculopathy Given that she does have some arthritis in the back, we will put her on meloxicam.  Add gabapentin to take at bedtime.   Repeat MRI of the back given she feels symptoms have progressed over the past 7 months. We will hold off on referral to pain specialist at this time and see how she does with the medications and what the MRI shows.  Patient is agreeable to this. - meloxicam (MOBIC) 15 MG tablet; Take 1 tablet (15 mg total) by mouth daily.  Dispense: 30 tablet; Refill: 2 - gabapentin (NEURONTIN) 300 MG capsule; 1 cap PO QHS x 1 wk then 2 cap PO QHS  Dispense: 60 capsule; Refill: 2 - MR Lumbar Spine W Wo Contrast; Future  5. Osteoarthritis of spine with radiculopathy, lumbar region See #4 above - meloxicam (MOBIC) 15 MG tablet; Take 1 tablet (15 mg total) by mouth daily.  Dispense: 30 tablet; Refill: 2  6. Need for immunization against influenza - Flu Vaccine QUAD 26mo+IM (Fluarix, Fluzone & Alfiuria Quad PF)    Patient was given the opportunity to ask questions.  Patient verbalized understanding of the plan and was able to repeat key elements of the plan.   Orders Placed This Encounter  Procedures   MR Lumbar Spine W Wo Contrast   Flu Vaccine QUAD 49mo+IM (Fluarix, Fluzone & Alfiuria Quad PF)   Amb Ref to Medical Weight Management     Requested Prescriptions   Signed Prescriptions Disp Refills   meloxicam (MOBIC) 15 MG tablet 30 tablet 2    Sig: Take 1 tablet (15 mg total) by mouth daily.   gabapentin (NEURONTIN) 300 MG capsule 60 capsule 2    Sig: 1 cap PO QHS x 1 wk then 2 cap PO QHS    Return in about 3 months (around 09/03/2021) for Give appt with [facility] in 3 wks for  BP recheck.  Jonah Blueeborah Lilliemae Fruge, MD, FACP

## 2021-06-15 ENCOUNTER — Telehealth: Payer: Self-pay

## 2021-06-15 NOTE — Telephone Encounter (Signed)
Reason for CRM: Tonya with the [facility] at [facility]  called in to inform Dr Laural Benes patient needing prior authorization to have a MRI on 06/16/21. Please call Tonya at Ph# [phone] ext# [phone]

## 2021-06-16 ENCOUNTER — Ambulatory Visit (HOSPITAL_COMMUNITY): Payer: BC Managed Care – PPO

## 2021-06-21 ENCOUNTER — Encounter (HOSPITAL_COMMUNITY): Payer: Self-pay

## 2021-06-21 ENCOUNTER — Ambulatory Visit (HOSPITAL_COMMUNITY): Payer: BC Managed Care – PPO

## 2021-06-21 ENCOUNTER — Telehealth: Payer: Self-pay

## 2021-06-21 NOTE — Telephone Encounter (Signed)
Contacted [employer] and spoke to Angelica medical reviewer    [id number] MRI Lumbar Spine W Wo Contrast  ICD- M54.16 Lumbar radiculopathy, > 6 wks    Angelica transferred me to the nurse to get image approved. Spoke to [place] gave all clinical information     Authorization [id number] Effective- 06/21/21 Expires- 07/21/21  Contacted the scheduling department and spoke to [employer] and pt is rescheduled for 9/14 at 3pm pt will need to arrive at 230pm at [facility] pt to go over new appt for MRI. Pt states her last insurance was canceled and she uploaded her new insurance yesterday. Made pt aware that I tried to do piror auth last week but was unsuccessful. Pt states she appreciate the help and pt is aware of appt. Pt doesn't have any questions or concerns

## 2021-06-21 NOTE — Telephone Encounter (Signed)
Prior auth has been done and approved 

## 2021-06-22 ENCOUNTER — Ambulatory Visit (HOSPITAL_COMMUNITY)
Admission: RE | Admit: 2021-06-22 | Discharge: 2021-06-22 | Disposition: A | Discharge status: Home / Self Care | Payer: BC Managed Care – PPO | Source: Ambulatory Visit | Attending: Internal Medicine | Admitting: Internal Medicine

## 2021-06-22 ENCOUNTER — Telehealth: Payer: Self-pay | Admitting: Internal Medicine

## 2021-06-22 ENCOUNTER — Other Ambulatory Visit: Payer: Self-pay

## 2021-06-22 DIAGNOSIS — M5116 Intervertebral disc disorders with radiculopathy, lumbar region: Secondary | ICD-10-CM | POA: Diagnosis not present

## 2021-06-22 DIAGNOSIS — M5416 Radiculopathy, lumbar region: Secondary | ICD-10-CM | POA: Diagnosis not present

## 2021-06-22 MED ORDER — GADOBUTROL 1 MMOL/ML IV SOLN
10.0000 mL | Freq: Once | INTRAVENOUS | Status: AC | PRN
  Administered 2021-06-22: 10 mL via INTRAVENOUS

## 2021-06-22 NOTE — Telephone Encounter (Signed)
Copied from CRM [id number]. Topic: General - Other >> Jun 20, 2021 10:04 AM Gaetana Michaelis A wrote: Reason for LPN:PYYFR with the [facility] at [facility] has called in to inform the practice that patient is needing prior authorization to have a MRI  .Please call Archie Patten further when possible   See patient encounters about prior auth. Created this encounter in error

## 2021-06-28 ENCOUNTER — Other Ambulatory Visit: Payer: Self-pay

## 2021-06-28 ENCOUNTER — Encounter: Payer: Self-pay | Admitting: Pharmacist

## 2021-06-28 ENCOUNTER — Ambulatory Visit: Payer: BC Managed Care – PPO | Attending: Internal Medicine | Admitting: Pharmacist

## 2021-06-28 VITALS — BP 115/79 | Wt 276.0 lb

## 2021-06-28 DIAGNOSIS — R030 Elevated blood-pressure reading, without diagnosis of hypertension: Secondary | ICD-10-CM | POA: Diagnosis not present

## 2021-06-28 DIAGNOSIS — R7303 Prediabetes: Secondary | ICD-10-CM | POA: Diagnosis not present

## 2021-06-28 MED ORDER — LOSARTAN POTASSIUM 25 MG PO TABS: 25.0000 mg | ORAL_TABLET | Freq: Every day | ORAL | 0 refills | Status: DC

## 2021-06-28 NOTE — Progress Notes (Signed)
   S:    Patient arrives in good spirits. Presents to the clinic for hypertension evaluation, counseling, and management. Patient was referred and last seen by Primary Care Provider on 06/03/2021. At that visit, patient's BP was 122/88. Over the past year, her BP in clinic has ranged from 122-148/78-100 mmHg. Patient is not currently on any medications for hypertension.   At today's visit, reports having headaches every day that last 4-5 days and pain is not relieved with OTC pain relievers. Reports occasional dizziness and chest pain that radiates to the shoulder. Chest pain occurs mostly she works (works for [employer] 5PM to 1:30PM).   Current BP Medications include:  None  Antihypertensives tried in the past include: None  Dietary habits include: 24 diet recall: frozen burrito for dinner, 2 yogurts, eats a lot of sandwiches, Jello-O. Occasionally drinks soda with rum drinks on the weekends Exercise habits include:Very active during work  Family / Social history: 43 YO maternal grandfather- died from MI, sister had stroke secondary due to HTN at 43 YO.   ASCVD risk factors include: Obesity, chronic inflammatory conditions (OA), family history of MI and stroke  O:   Home BP readings: wrist cuff, checks during evening  9/10: 135/80 96 bpm 9/12: 135/78 104 bpm 9/14: 150/83 98 bpm   Last 3 Office BP readings: BP Readings from Last 3 Encounters:  06/03/21 122/88  01/16/21 (!) 132/94  10/21/20 128/64   BMET    Component Value Date/Time   NA 136 01/16/2021 0120   K 4.3 01/16/2021 0120   CL 104 01/16/2021 0120   CO2 25 01/16/2021 0120   GLUCOSE 126 (H) 01/16/2021 0120   BUN 11 01/16/2021 0120   CREATININE 0.77 01/16/2021 0120   CALCIUM 9.1 01/16/2021 0120   GFRNONAA >60 01/16/2021 0120   GFRAA >60 05/01/2020 0022   Renal function: CrCl cannot be calculated (Patient's most recent lab result is older than the maximum 21 days allowed.).  Clinical ASCVD: No  The ASCVD Risk  score (Arnett DK, et al., 2019) failed to calculate for the following reasons:   Cannot find a previous HDL lab   Cannot find a previous total cholesterol lab  A/P: Hypertension evaluation, clinic BP currently controlled while not on HTN medications. However, patient has multiple ASCVD risk enhancing factors, home BP are elevated and patient is also experiencing headaches and occasional chest pain. Elevated home BP could be attributed to timing of measurement and/or inaccuracy of wrist BP cuff, but patient reports proper technique. BP Goal = < 130/80 mmHg. Starting a low dose RAAS agent would provide cardio-renal protection with minimal risk for adverse effects.  -Started losartan 25 mg daily.  -Patient extensively counseled on preventing orthostatic hypotension. Instructed to immediately discontinue losartan if she experiences dizziness or lightheadedness upon rising.  -F/u labs ordered -CMP, lipid panel, A1c as patient is currently in a fasting state. -Counseled on lifestyle modifications for blood pressure control including reduced dietary sodium, increased exercise, adequate sleep.  Results reviewed and written information provided.   Total time in face-to-face counseling 40 minutes.   F/U Pharmacy Clinic Visit in 1-2 weeks.  Valeda Malm, Pharm.D. PGY-1 Pharmacy Resident 06/28/2021 9:24 AM

## 2021-06-29 LAB — CMP14+EGFR
ALT: 23 IU/L (ref 0–32)
AST: 16 IU/L (ref 0–40)
Albumin/Globulin Ratio: 1.7 (ref 1.2–2.2)
Albumin: 4.3 g/dL (ref 3.8–4.8)
Alkaline Phosphatase: 79 IU/L (ref 44–121)
BUN/Creatinine Ratio: 26 — ABNORMAL HIGH (ref 9–23)
BUN: 17 mg/dL (ref 6–24)
Bilirubin Total: 0.5 mg/dL (ref 0.0–1.2)
CO2: 23 mmol/L (ref 20–29)
Calcium: 9.6 mg/dL (ref 8.7–10.2)
Chloride: 102 mmol/L (ref 96–106)
Creatinine, Ser: 0.66 mg/dL (ref 0.57–1.00)
Globulin, Total: 2.6 g/dL (ref 1.5–4.5)
Glucose: 107 mg/dL — ABNORMAL HIGH (ref 65–99)
Potassium: 4.3 mmol/L (ref 3.5–5.2)
Sodium: 140 mmol/L (ref 134–144)
Total Protein: 6.9 g/dL (ref 6.0–8.5)
eGFR: 112 mL/min/{1.73_m2} (ref >59)

## 2021-06-29 LAB — LIPID PANEL
Chol/HDL Ratio: 5.1 ratio — ABNORMAL HIGH (ref 0.0–4.4)
Cholesterol, Total: 189 mg/dL (ref 100–199)
HDL: 37 mg/dL — ABNORMAL LOW (ref >39)
LDL Chol Calc (NIH): 114 mg/dL — ABNORMAL HIGH (ref 0–99)
Triglycerides: 215 mg/dL — ABNORMAL HIGH (ref 0–149)
VLDL Cholesterol Cal: 38 mg/dL (ref 5–40)

## 2021-06-29 LAB — HEMOGLOBIN A1C
Est. average glucose Bld gHb Est-mCnc: 120 mg/dL
Hgb A1c MFr Bld: 5.8 % — ABNORMAL HIGH (ref 4.8–5.6)

## 2021-07-14 ENCOUNTER — Ambulatory Visit: Payer: BC Managed Care – PPO | Attending: Internal Medicine | Admitting: Pharmacist

## 2021-07-14 ENCOUNTER — Encounter: Payer: Self-pay | Admitting: Pharmacist

## 2021-07-14 ENCOUNTER — Other Ambulatory Visit: Payer: Self-pay

## 2021-07-14 VITALS — BP 111/76 | HR 67

## 2021-07-14 DIAGNOSIS — R030 Elevated blood-pressure reading, without diagnosis of hypertension: Secondary | ICD-10-CM

## 2021-07-14 NOTE — Progress Notes (Signed)
   S:    PCP: Dr. Laural Benes Patient arrives in good spirits. Presents to the clinic for hypertension evaluation, counseling, and management. Patient was referred and last seen by Primary Care Provider on 06/03/2021. At that visit, patient's BP was 122/88. Over the past year, her BP in clinic has ranged from 122-148/78-100 mmHg. Patient is not currently on any medications for hypertension.   At today's visit, reports one episode on 07/11/2021 where she reported dizziness and nausea. She checked her blood pressure and it was 118/73 mmHg HR 132, took medication and rechecked 109/74 HR 128. Denied any episodes since then. Denied headaches, chest pain, shortness of breath.   Current BP Medications include:  Losartan 25 mg PO daily  Antihypertensives tried in the past include: None  Dietary habits include: salads, yogurt, reports eating out once/twice a month. Reports limited salt use while cooking and at table. Reports "cut back" on sodas but drinks mostly water, juice and 1 cup of coffee. Occasionally drinks soda with rum drinks on the weekends Exercise habits include:Very active during work and up/down stairs at home Family / Social history: 43 YO maternal grandfather- died from MI, sister had stroke secondary due to HTN at 43 YO. Quit smoking 3 years ago  ASCVD risk factors include: Obesity, chronic inflammatory conditions (OA), family history of MI and stroke  O:  Vitals:   07/14/21 1021  BP: 111/76  Pulse: 67   Home BP readings: wrist cuff, checks daily, reports range in 120s after starting Losartan  Last 3 Office BP readings: BP Readings from Last 3 Encounters:  06/28/21 115/79  06/03/21 122/88  01/16/21 (!) 132/94   BMET    Component Value Date/Time   NA 140 06/28/2021 0915   K 4.3 06/28/2021 0915   CL 102 06/28/2021 0915   CO2 23 06/28/2021 0915   GLUCOSE 107 (H) 06/28/2021 0915   GLUCOSE 126 (H) 01/16/2021 0120   BUN 17 06/28/2021 0915   CREATININE 0.66 06/28/2021 0915    CALCIUM 9.6 06/28/2021 0915   GFRNONAA >60 01/16/2021 0120   GFRAA >60 05/01/2020 0022   Renal function: CrCl cannot be calculated (Unknown ideal weight.).  Clinical ASCVD: No  The 10-year ASCVD risk score (Arnett DK, et al., 2019) is: 3.7%   Values used to calculate the score:     Age: 45 years     Sex: Female     Is Non-Hispanic African American: No     Diabetic: No     Tobacco smoker: Yes     Systolic Blood Pressure: 115 mmHg     Is BP treated: No     HDL Cholesterol: 37 mg/dL     Total Cholesterol: 189 mg/dL  A/P: Hypertension follow-up. Clinic BP currently controlled while on losartan 25 mg daily. Home BP is now controlled. Of note, pt does check at home with a wrist cuff. BP Goal = < 130/80 mmHg. No changes at this time.  -Continue losartan 25 mg daily.  -Patient extensively counseled on preventing orthostatic hypotension. Instructed to immediately discontinue losartan if she experiences dizziness or lightheadedness upon rising.  -Follow-up labs: recommend BMP at PCP follow-up.  -Counseled on lifestyle modifications for blood pressure control including reduced dietary sodium, increased exercise, adequate sleep.  Results reviewed and written information provided.   Total time in face-to-face counseling 40 minutes.   F/U PCP  Clinic Visit on 08/26/2021.   Butch Penny, PharmD, Patsy [place], CPP Clinical Pharmacist [facility] [phone]

## 2021-08-26 ENCOUNTER — Other Ambulatory Visit: Payer: Self-pay

## 2021-08-26 ENCOUNTER — Encounter: Payer: Self-pay | Admitting: Internal Medicine

## 2021-08-26 ENCOUNTER — Ambulatory Visit: Payer: BC Managed Care – PPO | Attending: Internal Medicine | Admitting: Internal Medicine

## 2021-08-26 VITALS — BP 129/83 | HR 78 | Ht 66.0 in | Wt 272.2 lb

## 2021-08-26 DIAGNOSIS — L0293 Carbuncle, unspecified: Secondary | ICD-10-CM | POA: Insufficient documentation

## 2021-08-26 DIAGNOSIS — M4726 Other spondylosis with radiculopathy, lumbar region: Secondary | ICD-10-CM

## 2021-08-26 DIAGNOSIS — I10 Essential (primary) hypertension: Secondary | ICD-10-CM | POA: Diagnosis not present

## 2021-08-26 DIAGNOSIS — Z6841 Body Mass Index (BMI) 40.0 and over, adult: Secondary | ICD-10-CM

## 2021-08-26 DIAGNOSIS — M62830 Muscle spasm of back: Secondary | ICD-10-CM

## 2021-08-26 DIAGNOSIS — M797 Fibromyalgia: Secondary | ICD-10-CM

## 2021-08-26 DIAGNOSIS — Z23 Encounter for immunization: Secondary | ICD-10-CM | POA: Diagnosis not present

## 2021-08-26 DIAGNOSIS — R8290 Unspecified abnormal findings in urine: Secondary | ICD-10-CM

## 2021-08-26 LAB — POCT URINALYSIS DIP (CLINITEK)
Bilirubin, UA: NEGATIVE
Glucose, UA: NEGATIVE mg/dL
Ketones, POC UA: NEGATIVE mg/dL
Leukocytes, UA: NEGATIVE
Nitrite, UA: POSITIVE — AB
POC PROTEIN,UA: NEGATIVE
Spec Grav, UA: >=1.03 — AB (ref 1.010–1.025)
Urobilinogen, UA: 0.2 E.U./dL
pH, UA: 5.5 (ref 5.0–8.0)

## 2021-08-26 MED ORDER — SULFAMETHOXAZOLE-TRIMETHOPRIM 800-160 MG PO TABS: 1.0000 | ORAL_TABLET | Freq: Two times a day (BID) | ORAL | 0 refills | Status: DC

## 2021-08-26 MED ORDER — GABAPENTIN 300 MG PO CAPS: 600.0000 mg | ORAL_CAPSULE | Freq: Two times a day (BID) | ORAL | 6 refills | Status: DC

## 2021-08-26 MED ORDER — METHOCARBAMOL 500 MG PO TABS: 500.0000 mg | ORAL_TABLET | Freq: Every evening | ORAL | 3 refills | Status: DC | PRN

## 2021-08-26 NOTE — Patient Instructions (Signed)
Increase gabapentin to 600 mg twice a day as discussed. I have sent prescription for the muscle relaxant called methocarbamol for you to use as needed. I have sent a prescription for the antibiotics called Bactrim to your pharmacy.

## 2021-08-26 NOTE — Progress Notes (Signed)
Patient ID: Kathy Conway, female    DOB: 1976-09-16  MRN: [id number]  CC: Chronic disease management  Subjective: Kathy Conway is a 45 y.o. female who presents for chronic disease management Her concerns today include:  Pt with hx of HTN, obesity, prediabetes, chronic LBP, asthma,anxiety, vertigo, mood disorder,  former tob dep and bipolar   HYPERTENSION Currently taking: see medication list.  Blood pressure elevated on last visit.  I had her follow-up with the clinical pharmacist.  Blood pressure remained elevated so she was started on Cozaar. Med Adherence: [x]  Yes but did not take as yet for the morning  Medication side effects: []  Yes    [x]  No Adherence with salt restriction: [x]  Yes    []  No Home Monitoring?: [x]  Yes  but not recently.  Monitoring Frequency: last checked 3 wks ago Home BP results range:  SOB? []  Yes    [x]  No Chest Pain?: []  Yes    [x]  No Leg swelling?: []  Yes    [x]  No Headaches?: []  Yes    [x]  No Dizziness? []  Yes    [x]  No Comments:   LBP: On last visit, patient complained of worsening radicular lower back pain.  MRI was done.  This revealed progression of facet hypertrophy without significant canal or spinal stenosis.  Started on meloxicam and gabapentin on last visit.  She has been taking both.  She tells me that she takes the gabapentin during the day instead of at night because that is when her pain is worse.  She denies any drowsiness from taking it during the day.  She feels she would benefit from twice a day dosing. -pt states she feels shooting pains intermittently through out her body.  Gives hx of fibromyalgia and was on Dodge and Lyrica in past when she lived in [place].   -Requesting muscle relaxant to use as needed for her lower back.  She gets a lot of spasms in the lower back especially while working.  Her job involves a lot of bending and lifting.   Obesity/PreDM:  down 7 lbs since August.  She has changed to eating healthier snacks.   Drinks mainly water.  Walks twice a day for 30 mins.   Today she complains of strong odor to the urine.  Urine has a metallic smell.  She has been taking Azo.  Dysuria off and on.    Gets recurrent small cysts/boils on labia.  Had to have I&D of infected cyst/abscess on one of her libia several yrs ago.  Recurrent and "they get really sore." Also appear in axilla and on abdomen.  Still tobacco free Patient Active Problem List   Diagnosis Date Noted   Osteoarthritis of lumbar spine 08/06/2017   Class 3 severe obesity due to excess calories without serious comorbidity with body mass index (BMI) of 40.0 to 44.9 in adult [facility]) 08/06/2017   History of gestational diabetes 04/17/2017   Bilateral hearing loss 03/24/2017   Acute pain of right foot 03/24/2017   Insomnia 03/05/2017   Plantar fasciitis, left 10/23/2016   Chronic bilateral low back pain with bilateral sciatica 09/08/2016   IBS (irritable bowel syndrome) 05/23/2016   Asthma    Boils 03/10/2016   Anxiety state 03/10/2016   H/O degenerative disc disease    Fibromyalgia    Migraine 10/09/1996     Current Outpatient Medications on File Prior to Visit  Medication Sig Dispense Refill   gabapentin (NEURONTIN) 300 MG capsule 1 cap PO QHS  x 1 wk then 2 cap PO QHS 60 capsule 2   ibuprofen (ADVIL,MOTRIN) 600 MG tablet Take 1 tablet (600 mg total) by mouth every 8 (eight) hours as needed. 60 tablet 0   losartan (COZAAR) 25 MG tablet Take 1 tablet (25 mg total) by mouth daily. 90 tablet 0   meloxicam (MOBIC) 15 MG tablet Take 1 tablet (15 mg total) by mouth daily. 30 tablet 2   methocarbamol (ROBAXIN) 500 MG tablet Take 1 tablet (500 mg total) by mouth at bedtime as needed for muscle spasms. 20 tablet 0   traZODone (DESYREL) 150 MG tablet Take by mouth at bedtime. 1-2 tablets at bedtime prn     [DISCONTINUED] DULoxetine (CYMBALTA) 30 MG capsule Take 1 capsule (30 mg total) by mouth daily. (Patient not taking: Reported on 05/23/2018) 30  capsule 3   [DISCONTINUED] SUMAtriptan (IMITREX) 50 MG tablet Take 1 take at start of headache.  May repeat in 2 hrs if no relief.  Max 100 mg/24 hr (Patient not taking: Reported on 03/14/2018) 10 tablet 1   No current facility-administered medications on file prior to visit.    Allergies  Allergen Reactions   Codeine Shortness Of Breath   Iodine     Hives   Latex Itching, Swelling and Rash    Social History   Socioeconomic History   Marital status: Married    Spouse name: Not on file   Number of children: Not on file   Years of education: Not on file   Highest education level: Not on file  Occupational History   Not on file  Tobacco Use   Smoking status: Every Day    Packs/day: 0.50    Types: Cigarettes   Smokeless tobacco: Never  Substance and Sexual Activity   Alcohol use: No   Drug use: No   Sexual activity: Yes    Birth control/protection: Surgical  Other Topics Concern   Not on file  Social History Narrative   Not on file   Social Determinants of Health   Financial Resource Strain: Not on file  Food Insecurity: Not on file  Transportation Needs: Not on file  Physical Activity: Not on file  Stress: Not on file  Social Connections: Not on file  Intimate Partner Violence: Not on file    Family History  Problem Relation Age of Onset   Cancer Mother    Cancer Father    Diabetes Maternal Grandfather     Past Surgical History:  Procedure Laterality Date   ABDOMINAL HYSTERECTOMY     CHOLECYSTECTOMY     TUBAL LIGATION      ROS: Review of Systems Negative except as stated above  PHYSICAL EXAM: BP 129/83   Pulse 78   Ht 5\' 6"  (1.676 m)   Wt 272 lb 3.2 oz (123.5 kg)   SpO2 98%   BMI 43.93 kg/m   Wt Readings from Last 3 Encounters:  08/26/21 272 lb 3.2 oz (123.5 kg)  06/28/21 276 lb (125.2 kg)  06/03/21 279 lb 9.6 oz (126.8 kg)    Physical Exam  General appearance - alert, well appearing, obese middle-age Caucasian female and in no  distress Mental status - normal mood, behavior, speech, dress, motor activity, and thought processes Neck - supple, no significant adenopathy Chest - clear to auscultation, no wheezes, rales or rhonchi, symmetric air entry Heart - normal rate, regular rhythm, normal S1, S2, no murmurs, rubs, clicks or gallops Extremities - peripheral pulses normal, no pedal edema, no  clubbing or cyanosis GYN: CMA Lynita Lombard present as chaperone -she has a few small cysts noted on the right labia.  None appear infected at this time.  No significant scarring noted in the axilla. Results for orders placed or performed in visit on 08/26/21  POCT URINALYSIS DIP (CLINITEK)  Result Value Ref Range   Color, UA other (A) yellow   Clarity, UA clear clear   Glucose, UA negative negative mg/dL   Bilirubin, UA negative negative   Ketones, POC UA negative negative mg/dL   Spec Grav, UA >=9.417 (A) 1.010 - 1.025   Blood, UA moderate (A) negative   pH, UA 5.5 5.0 - 8.0   POC PROTEIN,UA negative negative, trace   Urobilinogen, UA 0.2 0.2 or 1.0 E.U./dL   Nitrite, UA Positive (A) Negative   Leukocytes, UA Negative Negative    CMP Latest Ref Rng & Units 06/28/2021 01/16/2021 10/20/2020  Glucose 65 - 99 mg/dL 408(X) 448(J) 856(D)  BUN 6 - 24 mg/dL 17 11 13   Creatinine 0.57 - 1.00 mg/dL 1.49 7.02  Sodium 134 - 144 mmol/L 140 136 138  Potassium 3.5 - 5.2 mmol/L 4.3 4.3 4.3  Chloride 96 - 106 mmol/L 102 104 102  CO2 20 - 29 mmol/L 23 25 25   Calcium 8.7 - 10.2 mg/dL 9.6 9.1 6.37)  Total Protein 6.0 - 8.5 g/dL 6.9 - -  Total Bilirubin 0.0 - 1.2 mg/dL 0.5 - -  Alkaline Phos 44 - 121 IU/L 79 - -  AST 0 - 40 IU/L 16 - -  ALT 0 - 32 IU/L 23 - -   Lipid Panel     Component Value Date/Time   CHOL 189 06/28/2021 0915   TRIG 215 (H) 06/28/2021 0915   HDL 37 (L) 06/28/2021 0915   CHOLHDL 5.1 (H) 06/28/2021 0915   LDLCALC 114 (H) 06/28/2021 0915    CBC    Component Value Date/Time   WBC 12.2 (H) 01/16/2021  0120   RBC 4.28 01/16/2021 0120   HGB 13.1 01/16/2021 0120   HCT 38.4 01/16/2021 0120   PLT 235 01/16/2021 0120   MCV 89.7 01/16/2021 0120   MCH 30.6 01/16/2021 0120   MCHC 34.1 01/16/2021 0120   RDW 13.2 01/16/2021 0120   LYMPHSABS 2.6 05/01/2020 0022   MONOABS 0.5 05/01/2020 0022   EOSABS 0.4 05/01/2020 0022   BASOSABS 0.1 05/01/2020 0022    ASSESSMENT AND PLAN: 1. Essential hypertension Not at goal but she has not taken the Cozaar as yet for today.  She will continue Cozaar and DASH diet.  Advised to check blood pressure at least twice a week with goal being 130/80 or lower.  2. Class 3 severe obesity due to excess calories without serious comorbidity with body mass index (BMI) of 45.0 to 49.9 in adult [facility]) Commended her on weight loss so far and dietary changes.  Encouraged her to continue healthy eating habits and continue to try to move as much as possible.  3. Osteoarthritis of spine with radiculopathy, lumbar region 4. Fibromyalgia We have increased the dose of the gabapentin to 600 mg twice a day. - gabapentin (NEURONTIN) 300 MG capsule; Take 2 capsules (600 mg total) by mouth 2 (two) times daily. 1 cap PO QHS x 1 wk then 2 cap PO QHS  Dispense: 120 capsule; Refill: 6  5. Foul smelling urine UA consistent with UTI.  Prescribed Bactrim - POCT URINALYSIS DIP (CLINITEK) - sulfamethoxazole-trimethoprim (BACTRIM DS) 800-160 MG tablet; Take 1 tablet  by mouth 2 (two) times daily.  Dispense: 14 tablet; Refill: 0  6. Need for vaccination against Streptococcus pneumoniae - Pneumococcal conjugate vaccine 20-valent  7. Recurrent boils Right now none of these boils appear infected.  However she is being placed on Bactrim for UTI.  I told her that recurrent boils like this are most likely due to MRSA.  Try using antibacterial Dial soap a few times a week. - sulfamethoxazole-trimethoprim (BACTRIM DS) 800-160 MG tablet; Take 1 tablet by mouth 2 (two) times daily.  Dispense: 14 tablet;  Refill: 0  8. Spasm of muscle of lower back Patient given prescription for Robaxin 500 mg to use at bedtime as needed.  Warned the that the medication can cause drowsiness.  Patient was given the opportunity to ask questions.  Patient verbalized understanding of the plan and was able to repeat key elements of the plan.   No orders of the defined types were placed in this encounter.    Requested Prescriptions    No prescriptions requested or ordered in this encounter    No follow-ups on file.  Jonah Blue, MD, FACP

## 2021-08-26 NOTE — Progress Notes (Signed)
Urine smells like metal.

## 2021-08-29 ENCOUNTER — Other Ambulatory Visit: Payer: Self-pay | Admitting: Internal Medicine

## 2021-08-29 DIAGNOSIS — M5416 Radiculopathy, lumbar region: Secondary | ICD-10-CM

## 2021-08-29 DIAGNOSIS — M4726 Other spondylosis with radiculopathy, lumbar region: Secondary | ICD-10-CM

## 2021-08-29 NOTE — Telephone Encounter (Signed)
Requested Prescriptions  Pending Prescriptions Disp Refills  . meloxicam (MOBIC) 15 MG tablet [Pharmacy Med Name: MELOXICAM 15 MG TABLET] 30 tablet 2    Sig: TAKE 1 TABLET (15 MG TOTAL) BY MOUTH DAILY.     Analgesics:  COX2 Inhibitors Passed - 08/29/2021  1:45 AM      Passed - HGB in normal range and within 360 days    Hemoglobin  Date Value Ref Range Status  01/16/2021 13.1 12.0 - 15.0 g/dL Final         Passed - Cr in normal range and within 360 days    Creatinine, Ser  Date Value Ref Range Status  06/28/2021 0.66 0.57 - 1.00 mg/dL Final         Passed - Patient is not pregnant      Passed - Valid encounter within last 12 months    Recent Outpatient Visits          3 days ago Essential hypertension   [facility] Solomon, Gavin Pound B, MD   1 month ago Elevated blood pressure reading   [facility] [place], Cornelius Moras, RPH-CPP   2 months ago Elevated blood pressure reading   [facility] Drucilla Chalet, RPH-CPP   2 months ago Morbid obesity [facility])   [place] [facility] Marcine Matar, MD   1 year ago Class 3 severe obesity due to excess calories without serious comorbidity with body mass index (BMI) of 45.0 to 49.9 in adult [facility])   [facility] Marcine Matar, MD      Future Appointments            In 3 months Laural Benes, Binnie Rail, MD [facility]

## 2021-09-08 ENCOUNTER — Other Ambulatory Visit: Payer: Self-pay | Admitting: Internal Medicine

## 2021-09-08 DIAGNOSIS — R030 Elevated blood-pressure reading, without diagnosis of hypertension: Secondary | ICD-10-CM

## 2021-11-15 ENCOUNTER — Emergency Department (HOSPITAL_COMMUNITY)
Admission: EM | Admit: 2021-11-15 | Discharge: 2021-11-16 | Discharge status: Against Medical Advice | Payer: BC Managed Care – PPO | Attending: Emergency Medicine | Admitting: Emergency Medicine

## 2021-11-15 DIAGNOSIS — R109 Unspecified abdominal pain: Secondary | ICD-10-CM | POA: Diagnosis not present

## 2021-11-15 DIAGNOSIS — R1032 Left lower quadrant pain: Secondary | ICD-10-CM | POA: Insufficient documentation

## 2021-11-15 DIAGNOSIS — Z5321 Procedure and treatment not carried out due to patient leaving prior to being seen by health care provider: Secondary | ICD-10-CM | POA: Diagnosis not present

## 2021-11-15 DIAGNOSIS — R197 Diarrhea, unspecified: Secondary | ICD-10-CM | POA: Diagnosis not present

## 2021-11-15 LAB — CBC WITH DIFFERENTIAL/PLATELET
Abs Immature Granulocytes: 0.05 10*3/uL (ref 0.00–0.07)
Basophils Absolute: 0 10*3/uL (ref 0.0–0.1)
Basophils Relative: 0 %
Eosinophils Absolute: 0.3 10*3/uL (ref 0.0–0.5)
Eosinophils Relative: 3 %
HCT: 40.6 % (ref 36.0–46.0)
Hemoglobin: 13.7 g/dL (ref 12.0–15.0)
Immature Granulocytes: 0 %
Lymphocytes Relative: 20 %
Lymphs Abs: 2.4 10*3/uL (ref 0.7–4.0)
MCH: 30.3 pg (ref 26.0–34.0)
MCHC: 33.7 g/dL (ref 30.0–36.0)
MCV: 89.8 fL (ref 80.0–100.0)
Monocytes Absolute: 0.5 10*3/uL (ref 0.1–1.0)
Monocytes Relative: 4 %
Neutro Abs: 8.8 10*3/uL — ABNORMAL HIGH (ref 1.7–7.7)
Neutrophils Relative %: 73 %
Platelets: 241 10*3/uL (ref 150–400)
RBC: 4.52 MIL/uL (ref 3.87–5.11)
RDW: 13.4 % (ref 11.5–15.5)
WBC: 12.1 10*3/uL — ABNORMAL HIGH (ref 4.0–10.5)
nRBC: 0 % (ref 0.0–0.2)

## 2021-11-15 LAB — COMPREHENSIVE METABOLIC PANEL
ALT: 30 U/L (ref 0–44)
AST: 19 U/L (ref 15–41)
Albumin: 3.7 g/dL (ref 3.5–5.0)
Alkaline Phosphatase: 75 U/L (ref 38–126)
Anion gap: 8 (ref 5–15)
BUN: 11 mg/dL (ref 6–20)
CO2: 28 mmol/L (ref 22–32)
Calcium: 9.3 mg/dL (ref 8.9–10.3)
Chloride: 105 mmol/L (ref 98–111)
Creatinine, Ser: 0.75 mg/dL (ref 0.44–1.00)
GFR, Estimated: >60 mL/min (ref >60)
Glucose, Bld: 151 mg/dL — ABNORMAL HIGH (ref 70–99)
Potassium: 4.3 mmol/L (ref 3.5–5.1)
Sodium: 141 mmol/L (ref 135–145)
Total Bilirubin: 0.7 mg/dL (ref 0.3–1.2)
Total Protein: 6.9 g/dL (ref 6.5–8.1)

## 2021-11-15 LAB — URINALYSIS, ROUTINE W REFLEX MICROSCOPIC
Bacteria, UA: NONE SEEN
Bilirubin Urine: NEGATIVE
Glucose, UA: NEGATIVE mg/dL
Ketones, ur: NEGATIVE mg/dL
Leukocytes,Ua: NEGATIVE
Nitrite: NEGATIVE
Protein, ur: NEGATIVE mg/dL
Specific Gravity, Urine: 1.023 (ref 1.005–1.030)
pH: 7 (ref 5.0–8.0)

## 2021-11-15 LAB — LIPASE, BLOOD: Lipase: 41 U/L (ref 11–51)

## 2021-11-15 NOTE — ED Triage Notes (Signed)
Pt c/o "persistent diarrhea xwks," described as "yellow, foamy." Pt states she's taken imodium w no relief. Endorses associated lower abd/back pain, L arm pain that radiates to shoulder & chest. Denies blood in stool, N/V, recent travel, fever, dizziness.

## 2021-11-15 NOTE — ED Provider Triage Note (Signed)
Emergency Medicine Provider Triage Evaluation Note  Kathy Conway , a 46 y.o. female  was evaluated in triage.  Pt complains of diarrhea x 2 weeks.  Denies recent travel.  Denies any blood in stools.  Denies recent abx.  States that the pain is now worsening in the left lower abdomen.  Has tried imodium.  Review of Systems  Positive: Diarrhea, abdominal pain Negative: Fever, chills  Physical Exam  There were no vitals taken for this visit. Gen:   Awake, no distress   Resp:  Normal effort  MSK:   Moves extremities without difficulty  Other:  Mild LLQ ttp  Medical Decision Making  Medically screening exam initiated at 10:31 PM.  Appropriate orders placed.  Kathy Conway was informed that the remainder of the evaluation will be completed by another provider, this initial triage assessment does not replace that evaluation, and the importance of remaining in the ED until their evaluation is complete.  Diarrhea, abdominal pain   Roxy Horseman, PA-C 11/15/21 2233

## 2021-11-16 ENCOUNTER — Emergency Department (HOSPITAL_COMMUNITY): Payer: BC Managed Care – PPO

## 2021-11-16 DIAGNOSIS — R109 Unspecified abdominal pain: Secondary | ICD-10-CM | POA: Diagnosis not present

## 2021-12-23 ENCOUNTER — Ambulatory Visit: Payer: BC Managed Care – PPO | Admitting: Internal Medicine

## 2022-01-17 ENCOUNTER — Encounter: Payer: Self-pay | Admitting: Internal Medicine

## 2022-01-17 ENCOUNTER — Ambulatory Visit: Payer: BC Managed Care – PPO | Attending: Internal Medicine | Admitting: Internal Medicine

## 2022-01-17 VITALS — BP 126/89 | HR 87 | Resp 16 | Ht 65.0 in | Wt 248.8 lb

## 2022-01-17 DIAGNOSIS — M797 Fibromyalgia: Secondary | ICD-10-CM | POA: Diagnosis not present

## 2022-01-17 DIAGNOSIS — M545 Low back pain, unspecified: Secondary | ICD-10-CM

## 2022-01-17 DIAGNOSIS — Z6841 Body Mass Index (BMI) 40.0 and over, adult: Secondary | ICD-10-CM

## 2022-01-17 DIAGNOSIS — M79672 Pain in left foot: Secondary | ICD-10-CM

## 2022-01-17 DIAGNOSIS — I10 Essential (primary) hypertension: Secondary | ICD-10-CM

## 2022-01-17 DIAGNOSIS — E66813 Obesity, class 3: Secondary | ICD-10-CM

## 2022-01-17 DIAGNOSIS — G8929 Other chronic pain: Secondary | ICD-10-CM

## 2022-01-17 DIAGNOSIS — F320 Major depressive disorder, single episode, mild: Secondary | ICD-10-CM

## 2022-01-17 MED ORDER — AMITRIPTYLINE HCL 10 MG PO TABS: 10.0000 mg | ORAL_TABLET | Freq: Every day | ORAL | 2 refills | Status: DC

## 2022-01-17 NOTE — Progress Notes (Signed)
? ? ?Patient ID: Kathy Conway, female    DOB: 17-Feb-1976  MRN: [id number] ? ?CC: Back Pain (Mid), Foot Pain (Left ), and Hypertension ? ? ?Subjective: ?Kathy Conway is a 46 y.o. female who presents for chronic disease management ?Her concerns today include:  ?Pt with hx of HTN, obesity, prediabetes, chronic LBP, asthma,anxiety, vertigo, mood disorder,  former tob dep and bipolar ? ?C/o pain in LT foot x 1 yr.  Getting worse ?Initially started as pain in 5th toe.  Now pain on dorsal surface.  Feels like sharp pains that radiate to toes, especially big toe. ?This slows her down at work where she is constantly on her feet.  At home she has to limp to get around.  Does not want to get out and do stuff because of the discomfort.  Pain is constant ?-swells a little with slight discoloration ?-no known injury to foot. ?-She is on meloxicam which she takes.  However sometimes she takes ibuprofen without relief. ? ?Also still complains of chronic pain in the lower back.  Reports feeling like there are balls on both sides of the lower back.  She had MRI of the lumbar spine 06/2021.  This revealed no nerve compression.  It indicated mild progression of lower lumbar facet hypertrophy without significant canal or foraminal stenosis.  Patient is requesting a referral for pain management stating that she did pain management in the past for her back.   ?-She discontinued taking gabapentin several weeks ago because she felt it was interfering with her memory.  She was on this to help with chronic back pain and fibromyalgia.  Reports being on Lyrica and Savella in the past and did not find those to be helpful. ? ?HTN:  taking Cozaar and limits salt in foods.  Did not take med as yet for the a.m.  Checks BP few times a wk.  Last check a few wks ago and it was 145/74 ? ?Obesity:  down 24 lbs since last visit ?Has cut back on sugar intake and goes to gym both days on wkends.  Also job involves a lot of walking, lifting,  pushing/pulling.  ? ?Depression screen is positive.  Denies any active suicidal thoughts at this time.  ?Lives with her sister to help take care of her knees.  Finding it stressful living with her sister. 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 ?Feels she does not get enough sleep. ? ?Patient Active Problem List  ? Diagnosis Date Noted  ? Recurrent boils 08/26/2021  ? Osteoarthritis of lumbar spine 08/06/2017  ? Class 3 severe obesity due to excess calories without serious comorbidity with body mass index (BMI) of 40.0 to 44.9 in adult [facility]) 08/06/2017  ? History of gestational diabetes 04/17/2017  ? Bilateral hearing loss 03/24/2017  ? Acute pain of right foot 03/24/2017  ? Insomnia 03/05/2017  ? Plantar fasciitis, left 10/23/2016  ? Chronic bilateral low back pain with bilateral sciatica 09/08/2016  ? IBS (irritable bowel syndrome) 05/23/2016  ? Asthma   ? Boils 03/10/2016  ? Anxiety state 03/10/2016  ? H/O degenerative disc disease   ? Fibromyalgia   ? Migraine 10/09/1996  ?  ? ?Current Outpatient Medications on File Prior to Visit  ?Medication Sig Dispense Refill  ? gabapentin (NEURONTIN) 300 MG capsule Take 2 capsules (600 mg total) by mouth 2 (two) times daily. 1 cap PO QHS x 1 wk then 2 cap  PO QHS (Patient not taking: Reported on 01/17/2022) 120 capsule 6  ? losartan (COZAAR) 25 MG tablet TAKE 1 TABLET (25 MG TOTAL) BY MOUTH DAILY. 90 tablet 1  ? meloxicam (MOBIC) 15 MG tablet TAKE 1 TABLET (15 MG TOTAL) BY MOUTH DAILY. 30 tablet 2  ? methocarbamol (ROBAXIN) 500 MG tablet Take 1 tablet (500 mg total) by mouth at bedtime as needed for muscle spasms. 30 tablet 3  ? sulfamethoxazole-trimethoprim (BACTRIM DS) 800-160 MG tablet Take 1 tablet by mouth 2 (two) times daily. (Patient not taking: Reported on 01/17/2022) 14 tablet 0  ? [DISCONTINUED] DULoxetine (CYMBALTA) 30 MG capsule Take 1 capsule (30 mg total) by mouth daily. (Patient not taking: Reported on 05/23/2018) 30 capsule  3  ? [DISCONTINUED] SUMAtriptan (IMITREX) 50 MG tablet Take 1 take at start of headache.  May repeat in 2 hrs if no relief.  Max 100 mg/24 hr (Patient not taking: Reported on 03/14/2018) 10 tablet 1  ? ?No current facility-administered medications on file prior to visit.  ? ? ?Allergies  ?Allergen Reactions  ? Codeine Shortness Of Breath  ? Iodine   ?  Hives  ? Latex Itching, Swelling and Rash  ? ? ?Social History  ? ?Socioeconomic History  ? Marital status: Married  ?  Spouse name: Not on file  ? Number of children: Not on file  ? Years of education: Not on file  ? Highest education level: Not on file  ?Occupational History  ? Not on file  ?Tobacco Use  ? Smoking status: Every Day  ?  Packs/day: 0.50  ?  Types: Cigarettes  ? Smokeless tobacco: Never  ?Substance and Sexual Activity  ? Alcohol use: No  ? Drug use: No  ? Sexual activity: Yes  ?  Birth control/protection: Surgical  ?Other Topics Concern  ? Not on file  ?Social History Narrative  ? Not on file  ? ?Social Determinants of Health  ? ?Financial Resource Strain: Not on file  ?Food Insecurity: Not on file  ?Transportation Needs: Not on file  ?Physical Activity: Not on file  ?Stress: Not on file  ?Social Connections: Not on file  ?Intimate Partner Violence: Not on file  ? ? ?Family History  ?Problem Relation Age of Onset  ? Cancer Mother   ? Cancer Father   ? Diabetes Maternal Grandfather   ? ? ?Past Surgical History:  ?Procedure Laterality Date  ? ABDOMINAL HYSTERECTOMY    ? CHOLECYSTECTOMY    ? TUBAL LIGATION    ? ? ?ROS: ?Review of Systems ?Negative except as stated above ? ?PHYSICAL EXAM: ?BP 126/89   Pulse 87   Resp 16   Ht 5\' 5"  (1.651 m)   Wt 248 lb 12.8 oz (112.9 kg)   SpO2 97%   BMI 41.40 kg/m?   ?Wt Readings from Last 3 Encounters:  ?01/17/22 248 lb 12.8 oz (112.9 kg)  ?08/26/21 272 lb 3.2 oz (123.5 kg)  ?06/28/21 276 lb (125.2 kg)  ? ? ?Physical Exam ? ?General appearance - alert, well appearing, middle-age obese Caucasian female and in no  distress ?Mental status - normal mood, behavior, speech, dress, motor activity, and thought processes ?Chest - clear to auscultation, no wheezes, rales or rhonchi, symmetric air entry ?Heart - normal rate, regular rhythm, normal S1, S2, no murmurs, rubs, clicks or gallops ?Musculoskeletal -left foot: No edema or erythema.  Foot is warm.  Good dorsalis pedis and posterior tibialis pulses.  No discomfort on palpation of the dorsal surface  of the foot. ?Extremities - peripheral pulses normal, no pedal edema, no clubbing or cyanosis ? ? ? ?  01/17/2022  ?  9:58 AM 08/26/2021  ?  8:41 AM 06/03/2021  ?  2:28 PM  ?Depression screen PHQ 2/9  ?Decreased Interest 1 1 0  ?Down, Depressed, Hopeless 1 1 1   ?PHQ - 2 Score 2 2 1   ?Altered sleeping 3 3   ?Tired, decreased energy 2 2   ?Change in appetite 1 1   ?Feeling bad or failure about yourself  0 0   ?Trouble concentrating 0 0   ?Moving slowly or fidgety/restless 0 0   ?Suicidal thoughts 1    ?PHQ-9 Score 9 8   ? ? ? ?  Latest Ref Rng & Units 11/15/2021  ? 10:57 PM 06/28/2021  ?  9:15 AM 01/16/2021  ?  1:20 AM  ?CMP  ?Glucose 70 - 99 mg/dL 06/30/2021   03/18/2021   831    ?BUN 6 - 20 mg/dL 11   17   11     ?Creatinine 0.44 - 1.00 mg/dL 517   616      ?Sodium 135 - 145 mmol/L 141   140   136    ?Potassium 3.5 - 5.1 mmol/L 4.3   4.3   4.3    ?Chloride 98 - 111 mmol/L 105   102   104    ?CO2 22 - 32 mmol/L 28   23   25     ?Calcium 8.9 - 10.3 mg/dL 9.3   9.6   9.1    ?Total Protein 6.5 - 8.1 g/dL 6.9   6.9     ?Total Bilirubin 0.3 - 1.2 mg/dL 0.7   0.5     ?Alkaline Phos 38 - 126 U/L 75   79     ?AST 15 - 41 U/L 19   16     ?ALT 0 - 44 U/L 30   23     ? ?Lipid Panel  ?   ?Component Value Date/Time  ? CHOL 189 06/28/2021 0915  ? TRIG 215 (H) 06/28/2021 0915  ? HDL 37 (L) 06/28/2021 0915  ? CHOLHDL 5.1 (H) 06/28/2021 0915  ? LDLCALC 114 (H) 06/28/2021 0915  ? ? ?CBC ?   ?Component Value Date/Time  ? WBC 12.1 (H) 11/15/2021 2257  ? RBC 4.52 11/15/2021 2257  ? HGB 13.7 11/15/2021 2257  ? HCT 40.6  11/15/2021 2257  ? PLT 241 11/15/2021 2257  ? MCV 89.8 11/15/2021 2257  ? MCH 30.3 11/15/2021 2257  ? MCHC 33.7 11/15/2021 2257  ? RDW 13.4 11/15/2021 2257  ? LYMPHSABS 2.4 11/15/2021 2257  ? MONOABS 0.5 11/15/2021 2257  ? EOS

## 2022-01-25 ENCOUNTER — Encounter: Payer: Self-pay | Admitting: Physical Medicine & Rehabilitation

## 2022-02-08 ENCOUNTER — Other Ambulatory Visit: Payer: Self-pay | Admitting: Internal Medicine

## 2022-02-08 DIAGNOSIS — M545 Low back pain, unspecified: Secondary | ICD-10-CM

## 2022-02-08 DIAGNOSIS — M797 Fibromyalgia: Secondary | ICD-10-CM

## 2022-02-08 DIAGNOSIS — M79672 Pain in left foot: Secondary | ICD-10-CM

## 2022-02-24 ENCOUNTER — Encounter: Payer: Self-pay | Admitting: Physical Medicine & Rehabilitation

## 2022-02-24 ENCOUNTER — Encounter
Payer: BC Managed Care – PPO | Attending: Physical Medicine & Rehabilitation | Admitting: Physical Medicine & Rehabilitation

## 2022-02-24 VITALS — BP 123/84 | HR 78 | Ht 65.0 in | Wt 240.0 lb

## 2022-02-24 DIAGNOSIS — G5762 Lesion of plantar nerve, left lower limb: Secondary | ICD-10-CM | POA: Diagnosis not present

## 2022-02-24 DIAGNOSIS — M797 Fibromyalgia: Secondary | ICD-10-CM | POA: Insufficient documentation

## 2022-02-24 DIAGNOSIS — M47816 Spondylosis without myelopathy or radiculopathy, lumbar region: Secondary | ICD-10-CM | POA: Insufficient documentation

## 2022-02-24 MED ORDER — TIZANIDINE HCL 4 MG PO TABS: 4.0000 mg | ORAL_TABLET | Freq: Every day | ORAL | 1 refills | Status: DC
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
## 2022-03-21 ENCOUNTER — Other Ambulatory Visit: Payer: Self-pay | Admitting: Physical Medicine & Rehabilitation

## 2022-03-26 ENCOUNTER — Emergency Department (HOSPITAL_COMMUNITY): Payer: BC Managed Care – PPO

## 2022-03-26 ENCOUNTER — Other Ambulatory Visit: Payer: Self-pay

## 2022-03-26 ENCOUNTER — Emergency Department (HOSPITAL_COMMUNITY)
Admission: EM | Admit: 2022-03-26 | Discharge: 2022-03-27 | Disposition: A | Discharge status: Home / Self Care | Payer: BC Managed Care – PPO | Attending: Emergency Medicine | Admitting: Emergency Medicine

## 2022-03-26 ENCOUNTER — Encounter (HOSPITAL_COMMUNITY): Payer: Self-pay | Admitting: Pharmacy Technician

## 2022-03-26 DIAGNOSIS — R110 Nausea: Secondary | ICD-10-CM | POA: Insufficient documentation

## 2022-03-26 DIAGNOSIS — Z91040 Latex allergy status: Secondary | ICD-10-CM | POA: Insufficient documentation

## 2022-03-26 DIAGNOSIS — R519 Headache, unspecified: Secondary | ICD-10-CM | POA: Insufficient documentation

## 2022-03-26 DIAGNOSIS — R42 Dizziness and giddiness: Secondary | ICD-10-CM | POA: Diagnosis not present

## 2022-03-26 DIAGNOSIS — H538 Other visual disturbances: Secondary | ICD-10-CM | POA: Insufficient documentation

## 2022-03-26 LAB — CBC WITH DIFFERENTIAL/PLATELET
Abs Immature Granulocytes: 0.04 10*3/uL (ref 0.00–0.07)
Basophils Absolute: 0 10*3/uL (ref 0.0–0.1)
Basophils Relative: 0 %
Eosinophils Absolute: 0.4 10*3/uL (ref 0.0–0.5)
Eosinophils Relative: 4 %
HCT: 37.6 % (ref 36.0–46.0)
Hemoglobin: 12.7 g/dL (ref 12.0–15.0)
Immature Granulocytes: 0 %
Lymphocytes Relative: 27 %
Lymphs Abs: 2.7 10*3/uL (ref 0.7–4.0)
MCH: 31.3 pg (ref 26.0–34.0)
MCHC: 33.8 g/dL (ref 30.0–36.0)
MCV: 92.6 fL (ref 80.0–100.0)
Monocytes Absolute: 0.5 10*3/uL (ref 0.1–1.0)
Monocytes Relative: 5 %
Neutro Abs: 6.4 10*3/uL (ref 1.7–7.7)
Neutrophils Relative %: 64 %
Platelets: 231 10*3/uL (ref 150–400)
RBC: 4.06 MIL/uL (ref 3.87–5.11)
RDW: 13.3 % (ref 11.5–15.5)
WBC: 10 10*3/uL (ref 4.0–10.5)
nRBC: 0 % (ref 0.0–0.2)

## 2022-03-26 LAB — COMPREHENSIVE METABOLIC PANEL
ALT: 19 U/L (ref 0–44)
AST: 15 U/L (ref 15–41)
Albumin: 3.4 g/dL — ABNORMAL LOW (ref 3.5–5.0)
Alkaline Phosphatase: 64 U/L (ref 38–126)
Anion gap: 8 (ref 5–15)
BUN: 12 mg/dL (ref 6–20)
CO2: 24 mmol/L (ref 22–32)
Calcium: 8.9 mg/dL (ref 8.9–10.3)
Chloride: 106 mmol/L (ref 98–111)
Creatinine, Ser: 0.69 mg/dL (ref 0.44–1.00)
GFR, Estimated: >60 mL/min (ref >60)
Glucose, Bld: 129 mg/dL — ABNORMAL HIGH (ref 70–99)
Potassium: 4.1 mmol/L (ref 3.5–5.1)
Sodium: 138 mmol/L (ref 135–145)
Total Bilirubin: 0.5 mg/dL (ref 0.3–1.2)
Total Protein: 6.1 g/dL — ABNORMAL LOW (ref 6.5–8.1)

## 2022-03-26 LAB — HCG, QUANTITATIVE, PREGNANCY: hCG, Beta Chain, Quant, S: 1 m[IU]/mL (ref <5)

## 2022-03-26 NOTE — ED Triage Notes (Signed)
Pt here with complaints of severe headache X2 weeks despite multiple OTC remedies. Pt states headache is worse in the mornings when she wakes up and that it feels like something is pushing on her eye sockets.

## 2022-03-26 NOTE — ED Provider Triage Note (Signed)
Emergency Medicine Provider Triage Evaluation Note  Kathy Conway , a 46 y.o. female  was evaluated in triage.  Pt complains of headache.  Patient has a history of migraine and tension headaches but states this 1 feels worse.  This headache was gradual in onset about 2 weeks ago.  Pain is described as burning/pressure.  It is gotten increasingly worse over the past 2 weeks and has not responded to over-the-counter therapies that have worked in the past for her headache.  Headache is described initially as worse upon wakening.  She also endorses blurred and double vision currently.  Eyes are described as "feeling like they are going to pop out."  Patient states she had a mother who passed away from brain tumor.  Denies fever, chills, night sweats, shortness of breath, abdominal pain, nausea/vomiting/diarrhea, urinary/vaginal symptoms, change in bowel habits.  Review of Systems  Positive: See above Negative:   Physical Exam  BP (!) 131/91 (BP Location: Right Arm)   Pulse 71   Temp 98.4 F (36.9 C) (Oral)   Resp 18   SpO2 95%  Gen:   Awake, no distress   Resp:  Normal effort  MSK:   Moves extremities without difficulty  Other:  Patient was seen double/blurry vision with EOMs.  PERRLA.  Cranial nerves 3 through 12 grossly intact.  Medical Decision Making  Medically screening exam initiated at 5:00 PM.  Appropriate orders placed.  DANEISHA SURGES was informed that the remainder of the evaluation will be completed by another provider, this initial triage assessment does not replace that evaluation, and the importance of remaining in the ED until their evaluation is complete.     Peter Garter, [place] 03/26/22 1709

## 2022-03-27 MED ORDER — ONDANSETRON 4 MG PO TBDP
4.0000 mg | ORAL_TABLET | Freq: Once | ORAL | Status: AC
  Administered 2022-03-27: 4 mg via ORAL
  Filled 2022-03-27: qty 1

## 2022-03-27 MED ORDER — METOCLOPRAMIDE HCL 5 MG/ML IJ SOLN
10.0000 mg | Freq: Once | INTRAMUSCULAR | Status: DC
  Filled 2022-03-27: qty 2

## 2022-03-27 MED ORDER — KETOROLAC TROMETHAMINE 30 MG/ML IJ SOLN
30.0000 mg | Freq: Once | INTRAMUSCULAR | Status: DC
Start: 2022-03-27 — End: 2022-03-27
  Filled 2022-03-27: qty 1

## 2022-03-27 MED ORDER — ONDANSETRON HCL 4 MG PO TABS: 4.0000 mg | ORAL_TABLET | Freq: Three times a day (TID) | ORAL | 0 refills | Status: DC | PRN

## 2022-03-27 MED ORDER — SUMATRIPTAN SUCCINATE 6 MG/0.5ML ~~LOC~~ SOLN
6.0000 mg | Freq: Once | SUBCUTANEOUS | Status: AC
  Administered 2022-03-27: 6 mg via SUBCUTANEOUS
  Filled 2022-03-27: qty 0.5

## 2022-03-27 MED ORDER — RIZATRIPTAN BENZOATE 10 MG PO TABS: 10.0000 mg | ORAL_TABLET | ORAL | 0 refills | Status: DC | PRN

## 2022-03-27 MED ORDER — SODIUM CHLORIDE 0.9 % IV BOLUS
1000.0000 mL | Freq: Once | INTRAVENOUS | Status: DC
Start: 2022-03-27 — End: 2022-03-27

## 2022-03-27 MED ORDER — DIPHENHYDRAMINE HCL 50 MG/ML IJ SOLN
25.0000 mg | Freq: Once | INTRAMUSCULAR | Status: DC
Start: 2022-03-27 — End: 2022-03-27
  Filled 2022-03-27: qty 1

## 2022-03-27 NOTE — ED Notes (Signed)
Patient verbalizes understanding of d/c instructions. Opportunities for questions and answers were provided. Pt d/c from ED and ambulated to lobby with husband.  

## 2022-03-27 NOTE — ED Provider Notes (Signed)
[facility] EMERGENCY DEPARTMENT Provider Note   CSN: [id number] Arrival date & time: 03/26/22  1619     History  Chief Complaint  Patient presents with   Headache    Kathy Conway is a 46 y.o. female  The history is provided by the patient.  Headache Pain location:  R parietal, L parietal and occipital Quality:  Dull and sharp Radiates to:  Does not radiate Severity currently:  8/10 Severity at highest:  10/10 Onset quality:  Gradual Duration:  2 weeks Timing:  Constant Progression:  Unchanged Chronicity:  New Similar to prior headaches: no   Relieved by:  Nothing Exacerbated by: lying down. Ineffective treatments:  Resting in a darkened room, NSAIDs, aspirin, acetaminophen and cold packs Associated symptoms: dizziness, nausea and visual change (blurry)   Associated symptoms: no photophobia        Home Medications Prior to Admission medications   Medication Sig Start Date End Date Taking? Authorizing Provider  amitriptyline (ELAVIL) 10 MG tablet TAKE 1 TABLET BY MOUTH EVERYDAY AT BEDTIME 02/08/22   Marcine Matar, MD  losartan (COZAAR) 25 MG tablet TAKE 1 TABLET (25 MG TOTAL) BY MOUTH DAILY. 09/09/21   Marcine Matar, MD  meloxicam (MOBIC) 15 MG tablet TAKE 1 TABLET (15 MG TOTAL) BY MOUTH DAILY. 08/29/21   Marcine Matar, MD  tiZANidine (ZANAFLEX) 4 MG tablet TAKE 1 TABLET BY MOUTH EVERY DAY 03/21/22   Fanny Dance, MD  DULoxetine (CYMBALTA) 30 MG capsule Take 1 capsule (30 mg total) by mouth daily. Patient not taking: Reported on 05/23/2018 12/06/17 10/21/20  Marcine Matar, MD  SUMAtriptan (IMITREX) 50 MG tablet Take 1 take at start of headache.  May repeat in 2 hrs if no relief.  Max 100 mg/24 hr Patient not taking: Reported on 03/14/2018 02/13/18 10/21/20  Marcine Matar, MD      Allergies    Codeine, Gabapentin, Iodine, and Latex    Review of Systems   Review of Systems  Eyes:  Negative for photophobia.  Gastrointestinal:   Positive for nausea.  Neurological:  Positive for dizziness and headaches.    Physical Exam Updated Vital Signs BP (!) 157/77   Pulse 70   Temp 98.4 F (36.9 C) (Oral)   Resp 18   SpO2 100%  Physical Exam Vitals and nursing note reviewed.  Constitutional:      General: She is not in acute distress.    Appearance: She is well-developed. She is not diaphoretic.  HENT:     Head: Normocephalic and atraumatic.     Right Ear: External ear normal.     Left Ear: External ear normal.     Nose: Nose normal.     Mouth/Throat:     Mouth: Mucous membranes are moist.  Eyes:     General: No scleral icterus.    Conjunctiva/sclera: Conjunctivae normal.     Pupils: Pupils are equal, round, and reactive to light.     Comments: No horizontal, vertical or rotational nystagmus  Neck:     Comments: Full active and passive ROM without pain No midline or paraspinal tenderness No nuchal rigidity or meningeal signs Cardiovascular:     Rate and Rhythm: Normal rate and regular rhythm.     Heart sounds: Normal heart sounds. No murmur heard.    No friction rub. No gallop.  Pulmonary:     Effort: Pulmonary effort is normal. No respiratory distress.     Breath sounds: Normal breath sounds.  No wheezing or rales.  Abdominal:     General: Bowel sounds are normal. There is no distension.     Palpations: Abdomen is soft. There is no mass.     Tenderness: There is no abdominal tenderness. There is no guarding or rebound.  Musculoskeletal:        General: Normal range of motion.     Cervical back: Normal range of motion and neck supple.  Lymphadenopathy:     Cervical: No cervical adenopathy.  Skin:    General: Skin is warm and dry.     Findings: No rash.  Neurological:     Mental Status: She is alert and oriented to person, place, and time.     Cranial Nerves: No cranial nerve deficit.     Motor: No abnormal muscle tone.     Coordination: Coordination normal.     Comments: Mental Status:  Alert,  oriented, thought content appropriate. Speech fluent without evidence of aphasia. Able to follow 2 step commands without difficulty.  Cranial Nerves:  II:  Peripheral visual fields grossly normal, pupils equal, round, reactive to light III,IV, VI: ptosis not present, extra-ocular motions intact bilaterally  V,VII: smile symmetric, facial light touch sensation equal VIII: hearing grossly normal bilaterally  IX,X: midline uvula rise  XI: bilateral shoulder shrug equal and strong XII: midline tongue extension  Motor:  5/5 in upper and lower extremities bilaterally including strong and equal grip strength and dorsiflexion/plantar flexion Sensory: Pinprick and light touch normal in all extremities.  Cerebellar: normal finger-to-nose with bilateral upper extremities Gait: normal gait and balance CV: distal pulses palpable throughout   Psychiatric:        Behavior: Behavior normal.        Thought Content: Thought content normal.        Judgment: Judgment normal.     ED Results / Procedures / Treatments   Labs (all labs ordered are listed, but only abnormal results are displayed) Labs Reviewed  COMPREHENSIVE METABOLIC PANEL - Abnormal; Notable for the following components:      Result Value   Glucose, Bld 129 (*)    Total Protein 6.1 (*)    Albumin 3.4 (*)    All other components within normal limits  CBC WITH DIFFERENTIAL/PLATELET  HCG, QUANTITATIVE, PREGNANCY    EKG None  Radiology CT Head Wo Contrast  Result Date: 03/26/2022 CLINICAL DATA:  46 year old female with acute headache. EXAM: CT HEAD WITHOUT CONTRAST TECHNIQUE: Contiguous axial images were obtained from the base of the skull through the vertex without intravenous contrast. RADIATION DOSE REDUCTION: This exam was performed according to the departmental dose-optimization program which includes automated exposure control, adjustment of the mA and/or kV according to patient size and/or use of iterative reconstruction  technique. COMPARISON:  10/17/2011 head CT FINDINGS: Brain: No evidence of acute infarction, hemorrhage, hydrocephalus, extra-axial collection or mass lesion/mass effect. Vascular: No hyperdense vessel or unexpected calcification. Skull: Normal. Negative for fracture or focal lesion. Sinuses/Orbits: No acute finding. Other: None. IMPRESSION: Unremarkable noncontrast head CT. Electronically Signed   By: Harmon Pier M.D.   On: 03/26/2022 18:12    Procedures Procedures    Medications Ordered in ED Medications  SUMAtriptan (IMITREX) injection 6 mg (6 mg Subcutaneous Given 03/27/22 0202)  ondansetron (ZOFRAN-ODT) disintegrating tablet 4 mg (4 mg Oral Given 03/27/22 0201)    ED Course/ Medical Decision Making/ A&P Clinical Course as of 03/27/22 0330  Mon Mar 27, 2022  0330 hCG, quantitative, pregnancy [AH]  0330 CBC  with Differential [AH]  0330 Comprehensive metabolic panel(!) [AH]  0330 Glucose(!): 129 [AH]  0330 CT Head Wo Contrast I visualized and interpreted CT head which shows no acute finding [AH]    Clinical Course User Index [AH] Arthor Captain, PA-C                           Medical Decision Making [name] presents with headache Given the large differential diagnosis for ROSETTE BELLAVANCE, the decision making in this case is of high complexity.  After evaluating all of the data points in this case, the presentation of KYSHA MURALLES is NOT consistent with skull fracture, meningitis/encephalitis, SAH/sentinel bleed, Intracranial Hemorrhage (ICH) (subdural/epidural), acute obstructive hydrocephalus, space occupying lesions, CVA, CO Poisoning, Basilar/vertebral artery dissection, preeclampsia, cerebral venous thrombosis, hypertensive emergency, temporal Arteritis, Idiopathic Intracranial Hypertension (pseudotumor cerebri).  Patient treated in the emergency department with sumatriptan with resolution of her headache.  Will discharge with rizatriptan, Zofran and outpatient neurology  follow-up.  Strict return and follow-up precautions have been given by me personally or by detailed written instructions verbalized by nursing staff using the teach back method to patient/family/caregiver.  Data Reviewed/Counseling: I have reviewed the patient's vital signs, nursing notes, and other relevant tests/information. I had a detailed discussion regarding the historical points, exam findings, and any diagnostic results supporting the discharge diagnosis. I also discussed the need for outpatient follow-up and the need to return to the ED if symptoms worsen or if there are any questions or concerns that arise at hom    Risk Prescription drug management.           Final Clinical Impression(s) / ED Diagnoses Final diagnoses:  None    Rx / DC Orders ED Discharge Orders     None         Arthor Captain, PA-C 03/27/22 8119    Dione Booze, MD 03/27/22 [phone]

## 2022-03-27 NOTE — Discharge Instructions (Signed)

## 2022-03-27 NOTE — ED Notes (Signed)
This RN walked in to give migraine cocktail, pt refused stating "that stuff makes me mean". This RN notified EDP at this time.

## 2022-03-29 ENCOUNTER — Ambulatory Visit: Payer: BC Managed Care – PPO | Admitting: Psychiatry

## 2022-03-29 ENCOUNTER — Encounter: Payer: Self-pay | Admitting: Psychiatry

## 2022-03-29 VITALS — BP 128/81 | HR 77 | Ht 65.0 in | Wt 241.1 lb

## 2022-03-29 DIAGNOSIS — M5412 Radiculopathy, cervical region: Secondary | ICD-10-CM

## 2022-03-29 DIAGNOSIS — G43111 Migraine with aura, intractable, with status migrainosus: Secondary | ICD-10-CM | POA: Diagnosis not present

## 2022-03-29 MED ORDER — KETOROLAC TROMETHAMINE 60 MG/2ML IM SOLN
60.0000 mg | Freq: Once | INTRAMUSCULAR | Status: AC
  Administered 2022-03-29: 30 mg via INTRAMUSCULAR

## 2022-03-29 MED ORDER — KETOROLAC TROMETHAMINE 10 MG PO TABS: 10.0000 mg | ORAL_TABLET | Freq: Three times a day (TID) | ORAL | 0 refills | Status: DC

## 2022-03-29 MED ORDER — KETOROLAC TROMETHAMINE 10 MG PO TABS: 10.0000 mg | ORAL_TABLET | Freq: Three times a day (TID) | ORAL | 0 refills | Status: AC

## 2022-03-29 MED ORDER — SUMATRIPTAN SUCCINATE 100 MG PO TABS
100.0000 mg | ORAL_TABLET | ORAL | 6 refills | Status: DC | PRN
Start: 2022-03-29 — End: 2023-11-12

## 2022-03-29 NOTE — Addendum Note (Signed)
Addended by: Ann Maki on: 03/29/2022 11:30 AM   Modules accepted: Orders

## 2022-03-29 NOTE — Patient Instructions (Addendum)
MRI of the cervical spine  Toradol shot today. Starting tomorrow take Toradol 10 mg pill three times a day for the next 4 days. Let me know if you still have a headache once you finish these pills.  Increase Imitrex dose to 100 mg as needed for migraines. Please limit Imitrex use to 2 times per day, 2 days per week
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
## 2022-03-29 NOTE — Progress Notes (Signed)
30 mg was wasted of the toradol Chanel N RMA witnessed.

## 2022-04-18 ENCOUNTER — Other Ambulatory Visit: Payer: Self-pay | Admitting: Physical Medicine & Rehabilitation

## 2022-04-20 ENCOUNTER — Encounter
Payer: BC Managed Care – PPO | Attending: Physical Medicine & Rehabilitation | Admitting: Physical Medicine & Rehabilitation

## 2022-04-20 ENCOUNTER — Encounter: Payer: Self-pay | Admitting: Physical Medicine & Rehabilitation

## 2022-04-20 VITALS — BP 119/83 | HR 60 | Temp 98.2°F | Ht 65.0 in | Wt 229.0 lb

## 2022-04-20 DIAGNOSIS — M47816 Spondylosis without myelopathy or radiculopathy, lumbar region: Secondary | ICD-10-CM | POA: Insufficient documentation

## 2022-04-20 MED ORDER — LIDOCAINE HCL 1 % IJ SOLN: 10.0000 mL | Freq: Once | INTRAMUSCULAR | Status: DC

## 2022-04-20 MED ORDER — LIDOCAINE HCL (PF) 2 % IJ SOLN: 10.0000 mL | Freq: Once | INTRAMUSCULAR | Status: DC

## 2022-04-20 NOTE — Progress Notes (Signed)

## 2022-04-20 NOTE — Progress Notes (Signed)
  PROCEDURE RECORD [facility]   Name: BRIYONNA OMARA DOB:04-03-1976 MRN: [id number]  Date:04/20/2022  Physician: Claudette Laws, MD    Nurse/CMA: Nedra Hai, CMA  Allergies:  Allergies  Allergen Reactions   Codeine Shortness Of Breath   Gabapentin     Caused issues with her memory.   Iodine     Hives   Latex Itching, Swelling and Rash    Consent Signed: Yes.    Is patient diabetic? No.  CBG today? .  Pregnant: No. LMP: No LMP recorded. Patient has had a hysterectomy. (age 61-55)  Anticoagulants: no Anti-inflammatory: yes (ibuprofen, last night) Antibiotics: no  Procedure: L4-5 L5-S1 Facet Joint Injection   Position: Prone Start Time: 10:38 am  End Time: 10:51 am  Fluoro Time: 54  RN/CMA Nedra Hai, CMA Cassie Henkels, CMA    Time 10:10 am 10:57 am    BP 119/83 140/78    Pulse 60 57    Respirations 16 16    O2 Sat 97 98    S/S 6 6    Pain Level 7/10 4/10     D/C home with husband, patient A & O X 3, D/C instructions reviewed, and sits independently.

## 2022-04-20 NOTE — Patient Instructions (Signed)

## 2022-05-02 ENCOUNTER — Other Ambulatory Visit: Payer: Self-pay | Admitting: Physical Medicine & Rehabilitation

## 2022-07-13 ENCOUNTER — Other Ambulatory Visit (HOSPITAL_COMMUNITY)
Admission: RE | Admit: 2022-07-13 | Discharge: 2022-07-13 | Disposition: A | Discharge status: Home / Self Care | Payer: BC Managed Care – PPO | Source: Ambulatory Visit | Attending: Internal Medicine | Admitting: Internal Medicine

## 2022-07-13 ENCOUNTER — Encounter: Payer: Self-pay | Admitting: Internal Medicine

## 2022-07-13 ENCOUNTER — Ambulatory Visit: Payer: BC Managed Care – PPO | Attending: Internal Medicine | Admitting: Internal Medicine

## 2022-07-13 VITALS — BP 148/89 | HR 105 | Ht 67.0 in | Wt 230.6 lb

## 2022-07-13 DIAGNOSIS — Z2821 Immunization not carried out because of patient refusal: Secondary | ICD-10-CM

## 2022-07-13 DIAGNOSIS — R7303 Prediabetes: Secondary | ICD-10-CM | POA: Diagnosis not present

## 2022-07-13 DIAGNOSIS — Z124 Encounter for screening for malignant neoplasm of cervix: Secondary | ICD-10-CM | POA: Insufficient documentation

## 2022-07-13 DIAGNOSIS — K5900 Constipation, unspecified: Secondary | ICD-10-CM

## 2022-07-13 DIAGNOSIS — I10 Essential (primary) hypertension: Secondary | ICD-10-CM

## 2022-07-13 DIAGNOSIS — Z23 Encounter for immunization: Secondary | ICD-10-CM | POA: Diagnosis not present

## 2022-07-13 DIAGNOSIS — E782 Mixed hyperlipidemia: Secondary | ICD-10-CM

## 2022-07-13 DIAGNOSIS — Z6836 Body mass index (BMI) 36.0-36.9, adult: Secondary | ICD-10-CM | POA: Diagnosis not present

## 2022-07-13 DIAGNOSIS — Z113 Encounter for screening for infections with a predominantly sexual mode of transmission: Secondary | ICD-10-CM | POA: Diagnosis not present

## 2022-07-13 DIAGNOSIS — L600 Ingrowing nail: Secondary | ICD-10-CM

## 2022-07-13 DIAGNOSIS — Z0001 Encounter for general adult medical examination with abnormal findings: Secondary | ICD-10-CM

## 2022-07-13 DIAGNOSIS — R002 Palpitations: Secondary | ICD-10-CM | POA: Diagnosis not present

## 2022-07-13 DIAGNOSIS — N889 Noninflammatory disorder of cervix uteri, unspecified: Secondary | ICD-10-CM

## 2022-07-13 DIAGNOSIS — Z0000 Encounter for general adult medical examination without abnormal findings: Secondary | ICD-10-CM | POA: Diagnosis not present

## 2022-07-13 MED ORDER — LOSARTAN POTASSIUM 25 MG PO TABS: 25.0000 mg | ORAL_TABLET | Freq: Every day | ORAL | 1 refills | Status: DC

## 2022-07-13 MED ORDER — CEPHALEXIN 500 MG PO CAPS: 500.0000 mg | ORAL_CAPSULE | Freq: Four times a day (QID) | ORAL | 0 refills | Status: DC

## 2022-07-13 NOTE — Progress Notes (Signed)
Patient ID: Kathy Conway, female    DOB: 1976/01/22  MRN: [id number]  CC: Annual Exam   Subjective: Kathy Conway is a 46 y.o. female who presents for annual exam Her concerns today include:  Pt with hx of HTN, obesity, prediabetes, chronic LBP, fibromyalgia, asthma,anxiety, vertigo, mood disorder,  former tob dep and bipolar, migraines  Pt had hysterectomy in 2018 due to growth in uterus.  It was not cancerous.  Ovaries and cervix left in place.   -no vaginal dischg or itching -sexually active with 1 female partner.  Request STD screening  Obesity/PreDM:  down additional 18 lbs since last visit 01/2022.  Job still very active involving a lot of walking, pushing, pulling and squatting Cut out fried foods, eat red meats 1-2 x/wk.  Drinks sodas only 1-2x/wk.  Drinking more water and tea. Down 6 clothes sizes.   Endorses feeling hot most of the times.  Occasional heart flutter. She has history of hyperlipidemia with last lipid profile showing LDL of 114 and triglycerides of 215.  HTN;  taking her Cozaar but did not take it as yet today because she was fasting. Checks BP but not often.  He limits salt in the foods.  No chest pains or shortness of breath.  Intermittent constipation.  Had to take laxative 2 days last wk.  Feels a little ball outside of rectum. Thinks it may be hemorrhoid but no rectal bleeding or blood in stools.  No rectal pain.  Last CBC 03/2022 was ok Reports having had c-scope in 2008 for diarrhea.    HM:  due for flu vaccine. Declines COVID vaccine.    Patient Active Problem List   Diagnosis Date Noted   Recurrent boils 08/26/2021   Osteoarthritis of lumbar spine 08/06/2017   Class 3 severe obesity due to excess calories without serious comorbidity with body mass index (BMI) of 40.0 to 44.9 in adult [facility]) 08/06/2017   History of gestational diabetes 04/17/2017   Bilateral hearing loss 03/24/2017   Acute pain of right foot 03/24/2017   Insomnia 03/05/2017    Plantar fasciitis, left 10/23/2016   Chronic bilateral low back pain with bilateral sciatica 09/08/2016   IBS (irritable bowel syndrome) 05/23/2016   Asthma    Boils 03/10/2016   Anxiety state 03/10/2016   H/O degenerative disc disease    Fibromyalgia    Migraine 10/09/1996     Current Outpatient Medications on File Prior to Visit  Medication Sig Dispense Refill   amitriptyline (ELAVIL) 10 MG tablet TAKE 1 TABLET BY MOUTH EVERYDAY AT BEDTIME 90 tablet 1   methocarbamol (ROBAXIN) 500 MG tablet Take 500 mg by mouth at bedtime as needed.     ondansetron (ZOFRAN) 4 MG tablet Take 1 tablet (4 mg total) by mouth every 8 (eight) hours as needed for nausea or vomiting. 10 tablet 0   SUMAtriptan (IMITREX) 100 MG tablet Take 1 tablet (100 mg total) by mouth as needed for migraine. May repeat in 2 hours if headache persists or recurs. Max dose 2 pills in 24 hours 12 tablet 6   tiZANidine (ZANAFLEX) 4 MG tablet TAKE 1 TABLET BY MOUTH EVERY DAY 30 tablet 1   [DISCONTINUED] DULoxetine (CYMBALTA) 30 MG capsule Take 1 capsule (30 mg total) by mouth daily. (Patient not taking: Reported on 05/23/2018) 30 capsule 3   No current facility-administered medications on file prior to visit.    Allergies  Allergen Reactions   Codeine Shortness Of Breath   Gabapentin  Caused issues with her memory.   Iodine     Hives   Latex Itching, Swelling and Rash    Social History   Socioeconomic History   Marital status: Married    Spouse name: Not on file   Number of children: Not on file   Years of education: Not on file   Highest education level: Not on file  Occupational History   Not on file  Tobacco Use   Smoking status: Every Day    Packs/day: 0.50    Types: Cigarettes   Smokeless tobacco: Never  Vaping Use   Vaping Use: Never used  Substance and Sexual Activity   Alcohol use: Yes    Alcohol/week: 2.0 standard drinks of alcohol    Types: 2 Shots of liquor per week    Comment: occassional    Drug use: No   Sexual activity: Yes    Birth control/protection: Surgical  Other Topics Concern   Not on file  Social History Narrative   Right handed   Caffeine intake 5 cups   Social Determinants of Health   Financial Resource Strain: Not on file  Food Insecurity: Not on file  Transportation Needs: Not on file  Physical Activity: Not on file  Stress: Not on file  Social Connections: Not on file  Intimate Partner Violence: Not on file    Family History  Problem Relation Age of Onset   Cancer Mother    Cancer Father    Heart failure Maternal Grandfather     Past Surgical History:  Procedure Laterality Date   ABDOMINAL HYSTERECTOMY     CHOLECYSTECTOMY     TUBAL LIGATION      ROS: Review of Systems  Constitutional:  Negative for fever.  HENT:  Negative for congestion and sore throat.        Had dental cleaning a few wks ago.  Brush and floss regularly. Slight decreased hearing in the left ear that she has had since a child.  Has not progressed and not bothersome to her.  Eyes:  Negative for visual disturbance.       Has not had a routine eye exam in several yrs  Respiratory:  Negative for shortness of breath.   Cardiovascular:  Negative for chest pain.       Occasional heart flutters.  Feels hot all the time  Gastrointestinal:  Negative for blood in stool.  Skin:        Complains of redness and soreness of the left third toe around the nail bed.  She had peeled off a piece of the nail that was hanging several days ago and since then it has been painful and sore.     PHYSICAL EXAM: BP (!) 148/89   Pulse (!) 105   Ht 5\' 7"  (1.702 m)   Wt 230 lb 9.6 oz (104.6 kg)   SpO2 96%   BMI 36.12 kg/m   Wt Readings from Last 3 Encounters:  07/13/22 230 lb 9.6 oz (104.6 kg)  04/20/22 229 lb (103.9 kg)  03/29/22 241 lb 2 oz (109.4 kg)    Physical Exam  General appearance - alert, well appearing, obese middle-age Caucasian female and in no distress Mental status -  normal mood, behavior, speech, dress, motor activity, and thought processes Eyes - pupils equal and reactive, extraocular eye movements intact Ears - bilateral TM's and external ear canals normal Nose - normal and patent, no erythema, discharge or polyps Mouth - mucous membranes moist, pharynx normal without  lesions Neck - supple, no significant adenopathy Lymphatics - no palpable lymphadenopathy, no hepatosplenomegaly Chest - clear to auscultation, no wheezes, rales or rhonchi, symmetric air entry Heart - normal rate, regular rhythm, normal S1, S2, no murmurs, rubs, clicks or gallops Abdomen - soft, nontender, nondistended, no masses or organomegaly Breasts -CMA Dorena Dew present for breast and pelvic exam: Breasts appear normal, no suspicious masses, no skin or nipple changes or axillary nodes Pelvic - normal external genitalia, vulva, vagina, and adnexa.  She has a 1 cm polypoid lesion on the cervix between the 6 to the 8 o'clock position Rectal -patient has a few rectal tags but no hemorrhoids seen. Extremities - peripheral pulses normal, no pedal edema, no clubbing or cyanosis Skin -left foot: She has mild redness and soreness around the nailbed of the third toe.  Toenail is slightly ingrown and cut too short.     07/13/2022    3:08 PM 02/24/2022   11:07 AM 01/17/2022    9:58 AM  Depression screen PHQ 2/9  Decreased Interest 0 0 1  Down, Depressed, Hopeless 0 0 1  PHQ - 2 Score 0 0 2  Altered sleeping 2 0 3  Tired, decreased energy 1 0 2  Change in appetite 1 0 1  Feeling bad or failure about yourself  0 0 0  Trouble concentrating 0 0 0  Moving slowly or fidgety/restless 0 0 0  Suicidal thoughts 0 0 1  PHQ-9 Score 4 0 9       Latest Ref Rng & Units 03/26/2022    5:03 PM 11/15/2021   10:57 PM 06/28/2021    9:15 AM  CMP  Glucose 70 - 99 mg/dL 129  151  107   BUN 6 - 20 mg/dL 12  11  17    Creatinine 0.44 - 1.00 mg/dL 0.69  0.75  0.66   Sodium 135 - 145 mmol/L 138  141   140   Potassium 3.5 - 5.1 mmol/L 4.1  4.3  4.3   Chloride 98 - 111 mmol/L 106  105  102   CO2 22 - 32 mmol/L 24  28  23    Calcium 8.9 - 10.3 mg/dL 8.9  9.3  9.6   Total Protein 6.5 - 8.1 g/dL 6.1  6.9  6.9   Total Bilirubin 0.3 - 1.2 mg/dL 0.5  0.7  0.5   Alkaline Phos 38 - 126 U/L 64  75  79   AST 15 - 41 U/L 15  19  16    ALT 0 - 44 U/L 19  30  23     Lipid Panel     Component Value Date/Time   CHOL 189 06/28/2021 0915   TRIG 215 (H) 06/28/2021 0915   HDL 37 (L) 06/28/2021 0915   CHOLHDL 5.1 (H) 06/28/2021 0915   LDLCALC 114 (H) 06/28/2021 0915    CBC    Component Value Date/Time   WBC 10.0 03/26/2022 1703   RBC 4.06 03/26/2022 1703   HGB 12.7 03/26/2022 1703   HCT 37.6 03/26/2022 1703   PLT 231 03/26/2022 1703   MCV 92.6 03/26/2022 1703   MCH 31.3 03/26/2022 1703   MCHC 33.8 03/26/2022 1703   RDW 13.3 03/26/2022 1703   LYMPHSABS 2.7 03/26/2022 1703   MONOABS 0.5 03/26/2022 1703   EOSABS 0.4 03/26/2022 1703   BASOSABS 0.0 03/26/2022 1703    ASSESSMENT AND PLAN: 1. Annual physical exam Pap smear sent today. Discussed recommendations around mammogram for breast cancer screening.  Advised that some women do choose to start screening in their 30s though there may be a slight risk of false positive due to more dense breast tissue.  Patient has decided she will wait until her mid 108s before she starts having mammograms.  2. Essential hypertension Not at goal.  She has not taken the Cozaar as yet for today.  Advised to take it as soon as she returns home. - losartan (COZAAR) 25 MG tablet; Take 1 tablet (25 mg total) by mouth daily.  Dispense: 90 tablet; Refill: 1  3. Pap smear for cervical cancer screening - Cytology - PAP - Cervicovaginal ancillary only  4. Cervical lesion We will refer to gynecology to take a look at the polypoid lesion on her cervix.  It looks like it may simply be a polyp. - Ambulatory referral to Gynecology  5. Constipation, unspecified  constipation type Encouraged her to increase fiber in the diet by eating more green leafy vegetables.  Drink several glasses of water daily.  Recommend eating prunes a few times a week or drinking a little bit of prune juice.  She can also purchase MiraLAX over-the-counter and use as needed.  6. Class 2 severe obesity with serious comorbidity and body mass index (BMI) of 36.0 to 36.9 in adult, unspecified obesity type ([place]) Commended her on weight loss.  Encouraged her to keep up the good works with healthy eating habits and to stay active. - Hemoglobin A1c  7. Prediabetes See #6 above. - Hemoglobin A1c  8. Mixed hyperlipidemia - Lipid panel  9. Ingrown toenail with infection Advised not to cut the nails so short.  We will give a course of antibiotics. - cephALEXin (KEFLEX) 500 MG capsule; Take 1 capsule (500 mg total) by mouth 4 (four) times daily.  Dispense: 28 capsule; Refill: 0  10. Fluttering sensation of heart - TSH+T4F+T3Free  11. Need for immunization against influenza - Flu Vaccine QUAD 71mo+IM (Fluarix, Fluzone & Alfiuria Quad PF)  12. COVID-19 vaccination declined Recommended.  Patient declined.    Patient was given the opportunity to ask questions.  Patient verbalized understanding of the plan and was able to repeat key elements of the plan.   This documentation was completed using Radio producer.  Any transcriptional errors are unintentional.  Orders Placed This Encounter  Procedures   Flu Vaccine QUAD 78mo+IM (Fluarix, Fluzone & Alfiuria Quad PF)   Hemoglobin A1c   Lipid panel   TSH+T4F+T3Free   Ambulatory referral to Gynecology     Requested Prescriptions   Signed Prescriptions Disp Refills   cephALEXin (KEFLEX) 500 MG capsule 28 capsule 0    Sig: Take 1 capsule (500 mg total) by mouth 4 (four) times daily.   losartan (COZAAR) 25 MG tablet 90 tablet 1    Sig: Take 1 tablet (25 mg total) by mouth daily.    Return in about 4 months  (around 11/13/2022).  Karle Plumber, MD, FACP

## 2022-07-13 NOTE — Patient Instructions (Signed)

## 2022-07-14 LAB — CERVICOVAGINAL ANCILLARY ONLY
Bacterial Vaginitis (gardnerella): NEGATIVE
Candida Glabrata: NEGATIVE
Candida Vaginitis: NEGATIVE
Chlamydia: NEGATIVE
Comment: NEGATIVE
Comment: NEGATIVE
Comment: NEGATIVE
Comment: NEGATIVE
Comment: NEGATIVE
Comment: NORMAL
Neisseria Gonorrhea: NEGATIVE
Trichomonas: NEGATIVE

## 2022-07-14 LAB — TSH+T4F+T3FREE
Free T4: 1.01 ng/dL (ref 0.82–1.77)
T3, Free: 3.1 pg/mL (ref 2.0–4.4)
TSH: 1.4 u[IU]/mL (ref 0.450–4.500)

## 2022-07-14 LAB — LIPID PANEL
Chol/HDL Ratio: 4.8 ratio — ABNORMAL HIGH (ref 0.0–4.4)
Cholesterol, Total: 201 mg/dL — ABNORMAL HIGH (ref 100–199)
HDL: 42 mg/dL (ref >39)
LDL Chol Calc (NIH): 128 mg/dL — ABNORMAL HIGH (ref 0–99)
Triglycerides: 174 mg/dL — ABNORMAL HIGH (ref 0–149)
VLDL Cholesterol Cal: 31 mg/dL (ref 5–40)

## 2022-07-14 LAB — HEMOGLOBIN A1C
Est. average glucose Bld gHb Est-mCnc: 108 mg/dL
Hgb A1c MFr Bld: 5.4 % (ref 4.8–5.6)

## 2022-07-17 LAB — CYTOLOGY - PAP
Comment: NEGATIVE
Diagnosis: NEGATIVE
High risk HPV: NEGATIVE

## 2022-07-19 ENCOUNTER — Encounter: Payer: Self-pay | Admitting: Radiology

## 2022-07-19 ENCOUNTER — Ambulatory Visit: Payer: BC Managed Care – PPO | Admitting: Radiology

## 2022-07-19 VITALS — BP 118/70 | Ht 65.0 in | Wt 227.0 lb

## 2022-07-19 DIAGNOSIS — N842 Polyp of vagina: Secondary | ICD-10-CM

## 2022-07-19 DIAGNOSIS — Z90710 Acquired absence of both cervix and uterus: Secondary | ICD-10-CM | POA: Diagnosis not present

## 2022-07-19 NOTE — Progress Notes (Signed)
   Kathy Conway 1976-05-16 [id number]   History:  46 y.o. G3P2 presents for evaluation of cervical lesion seen by PCP during pelvic exam. Pap was normal. Patient had hyst 2011, still has ovaries. Denies any pain or bleeding.   Gynecologic History Hysterectomy: pelvic pain 2011. See path report below  REPORT OF SURGICAL PATHOLOGY    Case #: [id number]   Patient Name: Kathy Conway, Kathy Conway   Office Chart Number: [id number]    MRN: [id number]   Pathologist: Saralyn Pilar M.D., Jenny Reichmann   DOB/Age Mar 16, 1976 (Age: 46) Gender: F   Date Taken: 05/12/2010   Date Received: 05/12/2010    FINAL DIAGNOSIS   Microscopic Examination and Diagnosis    1. UTERUS AND CERVIX, : - CERVIX: SQUAMOUS METAPLASIA   - endometrium: Extensive fibrosis and degenerative changes. No hyperplasia   or malignancy   - myometrium: Unremarkable.   - uterine serosa: Unremarkable. No endometriosis or evidence of malignancy.  -Cervix: The cervical segment measures 2.5 cm in length, 3.2 cm in diameter, with   a smooth, glistening, pink tan exocervix and an ovoid, patent os, 0.8 cm in   diameter. The junction area is fairly well demarcated, and the endocervical   mucosa is glistening and pink tan.    DATE REPORTED: 05/13/2010   Electronically Signed Out by Saralyn Pilar M.D., Jenny Reichmann, Pathologist   CLINICAL HISTORY    SPECIMEN(S) OBTAINED   1. Uterus and cervix,    SPECIMEN COMMENTS:   1. Pelvic pain. (Lw)   Health Maintenance Last Pap: 07/13/22. Results were: NIL, neg HPV Last mammogram: never, overdue  Past medical history, past surgical history, family history and social history were all reviewed and documented in the EPIC chart.  ROS:  A ROS was performed and pertinent positives and negatives are included.  Exam:  Vitals:   07/19/22 1017  BP: 118/70  Weight: 227 lb (103 kg)  Height: 5\' 5"  (1.651 m)   Body mass index is 37.77 kg/m.  General appearance:  Normal, obese  Genitourinary   Inguinal/mons:  Normal without  inguinal adenopathy  External genitalia:  Normal appearing vulva with no masses, tenderness, or lesions  BUS/Urethra/Skene's glands:  Normal  Vagina:  Normal appearing with normal color and discharge. 33mm polypoid area seen 2cm behind the hymenal ring on the left vaginal wall, thick base.  Cervix:  surgically absent, cuff intact  Uterus:  absent  Adnexa/parametria:     Rt: Normal in size, without masses or tenderness.   Lt: Normal in size, without masses or tenderness.  Anus and perineum: Normal   Leatrice Jewels, CMA present for exam  Assessment/Plan:    1. Vaginal polyp Small 23mm vaginal polyp present on the left vaginal wall with thick base, not removed  2. Status post hysterectomy Reassured no cervix- it was removed in 2011 as confirmed by pathology report  Has yet to have screening mammogram- guidelines reviewed, should start at age 36 May d/c paps, no hx of abnormal paps, no cervix, no cancer on pathology report  Kerry Dory WHNP-BC 10:36 AM 07/19/2022

## 2022-08-03 ENCOUNTER — Ambulatory Visit (HOSPITAL_COMMUNITY): Payer: BC Managed Care – PPO

## 2022-08-11 ENCOUNTER — Other Ambulatory Visit: Payer: Self-pay | Admitting: Internal Medicine

## 2022-08-11 DIAGNOSIS — Z1231 Encounter for screening mammogram for malignant neoplasm of breast: Secondary | ICD-10-CM

## 2022-09-02 ENCOUNTER — Emergency Department (HOSPITAL_COMMUNITY)
Admission: EM | Admit: 2022-09-02 | Discharge: 2022-09-02 | Disposition: A | Discharge status: Home / Self Care | Payer: BC Managed Care – PPO | Attending: Emergency Medicine | Admitting: Emergency Medicine

## 2022-09-02 DIAGNOSIS — M25512 Pain in left shoulder: Secondary | ICD-10-CM | POA: Diagnosis not present

## 2022-09-02 DIAGNOSIS — Z91040 Latex allergy status: Secondary | ICD-10-CM | POA: Insufficient documentation

## 2022-09-02 DIAGNOSIS — M25511 Pain in right shoulder: Secondary | ICD-10-CM | POA: Diagnosis not present

## 2022-09-02 DIAGNOSIS — Z79899 Other long term (current) drug therapy: Secondary | ICD-10-CM | POA: Diagnosis not present

## 2022-09-02 DIAGNOSIS — M542 Cervicalgia: Secondary | ICD-10-CM | POA: Insufficient documentation

## 2022-09-02 MED ORDER — ACETAMINOPHEN 500 MG PO TABS
1000.0000 mg | ORAL_TABLET | Freq: Once | ORAL | Status: AC
  Administered 2022-09-02: 1000 mg via ORAL
  Filled 2022-09-02: qty 2

## 2022-09-02 MED ORDER — KETOROLAC TROMETHAMINE 30 MG/ML IJ SOLN
30.0000 mg | Freq: Once | INTRAMUSCULAR | Status: AC
  Administered 2022-09-02: 30 mg via INTRAMUSCULAR
  Filled 2022-09-02: qty 1

## 2022-09-02 MED ORDER — OXYCODONE HCL 5 MG PO TABS
5.0000 mg | ORAL_TABLET | Freq: Once | ORAL | Status: AC
  Administered 2022-09-02: 5 mg via ORAL
  Filled 2022-09-02: qty 1

## 2022-09-02 MED ORDER — DIAZEPAM 5 MG PO TABS
5.0000 mg | ORAL_TABLET | Freq: Once | ORAL | Status: AC
  Administered 2022-09-02: 5 mg via ORAL
  Filled 2022-09-02: qty 1

## 2022-09-02 NOTE — ED Triage Notes (Signed)
Patient here with complaint of bilateral shoulder pain that started approximately one month ago, described as pinching when she moves her shoulders. Seen by a neurologist for same and told she needed an MRI but patient was unable to afford the upfront cost for the imaging. Patient is alert, oriented, and in no apparent distress at hits time.

## 2022-09-02 NOTE — Discharge Instructions (Addendum)
Please follow up with your neurologist to obtain further workup.

## 2022-09-02 NOTE — ED Notes (Signed)
Patient verbalizes understanding of discharge instructions. Opportunity for questioning and answers were provided. Pt discharged from ED. 
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
## 2022-09-10 ENCOUNTER — Emergency Department (HOSPITAL_COMMUNITY)
Admission: EM | Admit: 2022-09-10 | Discharge: 2022-09-11 | Discharge status: Against Medical Advice | Payer: BC Managed Care – PPO | Attending: Emergency Medicine | Admitting: Emergency Medicine

## 2022-09-10 ENCOUNTER — Emergency Department (HOSPITAL_COMMUNITY): Payer: BC Managed Care – PPO

## 2022-09-10 ENCOUNTER — Encounter (HOSPITAL_COMMUNITY): Payer: Self-pay

## 2022-09-10 DIAGNOSIS — M542 Cervicalgia: Secondary | ICD-10-CM | POA: Insufficient documentation

## 2022-09-10 DIAGNOSIS — M79601 Pain in right arm: Secondary | ICD-10-CM | POA: Insufficient documentation

## 2022-09-10 DIAGNOSIS — R519 Headache, unspecified: Secondary | ICD-10-CM | POA: Insufficient documentation

## 2022-09-10 DIAGNOSIS — Z5321 Procedure and treatment not carried out due to patient leaving prior to being seen by health care provider: Secondary | ICD-10-CM | POA: Insufficient documentation

## 2022-09-10 MED ORDER — OXYCODONE-ACETAMINOPHEN 5-325 MG PO TABS
1.0000 | ORAL_TABLET | Freq: Once | ORAL | Status: AC
  Administered 2022-09-10: 1 via ORAL
  Filled 2022-09-10: qty 1

## 2022-09-10 NOTE — ED Triage Notes (Signed)
Pt states that she has been having pain in her neck that radiates down her R arm for the past week. Denies fevers, reports that it hurts to move her arm cause pain shoots to shoulder and neck

## 2022-09-10 NOTE — ED Provider Triage Note (Signed)
Emergency Medicine Provider Triage Evaluation Note  Kathy Conway , a 46 y.o. female  was evaluated in triage.  Pt complains of pain in the right neck with radiation into the right arm.  This has been ongoing for about a week and a half.  She states that prior to this she had a headache, which has improved.  No fevers.  She is ambulatory and does not have any lower extremity weakness.  Pain is much worse with certain positions.  She states that she has not been able to sleep due to the severe pain.  Review of Systems  Positive: Neck and arm pain Negative: Lower extremity weakness  Physical Exam  BP (!) 207/107 (BP Location: Left Arm)   Pulse 91   Temp 99.3 F (37.4 C) (Oral)   Resp 18   SpO2 100%  Gen:   Awake, appears uncomfortable Resp:  Normal effort  MSK:   Moves extremities without difficulty  Other:  Tender over the lower cervical and upper thoracic spine, midline to the right sided paraspinous muscles  Medical Decision Making  Medically screening exam initiated at 7:19 PM.  Appropriate orders placed.  Kathy Conway was informed that the remainder of the evaluation will be completed by another provider, this initial triage assessment does not replace that evaluation, and the importance of remaining in the ED until their evaluation is complete.     Renne Crigler, PA-C 09/10/22 1920

## 2022-09-11 NOTE — ED Notes (Signed)
Pt decided to leave while waiting for a room.  

## 2022-09-12 DIAGNOSIS — M62830 Muscle spasm of back: Secondary | ICD-10-CM | POA: Diagnosis not present

## 2022-09-12 DIAGNOSIS — M9903 Segmental and somatic dysfunction of lumbar region: Secondary | ICD-10-CM | POA: Diagnosis not present

## 2022-09-12 DIAGNOSIS — M9901 Segmental and somatic dysfunction of cervical region: Secondary | ICD-10-CM | POA: Diagnosis not present

## 2022-09-12 DIAGNOSIS — S46011A Strain of muscle(s) and tendon(s) of the rotator cuff of right shoulder, initial encounter: Secondary | ICD-10-CM | POA: Diagnosis not present

## 2022-09-12 DIAGNOSIS — M9907 Segmental and somatic dysfunction of upper extremity: Secondary | ICD-10-CM | POA: Diagnosis not present

## 2022-09-12 DIAGNOSIS — M9904 Segmental and somatic dysfunction of sacral region: Secondary | ICD-10-CM | POA: Diagnosis not present

## 2022-09-12 DIAGNOSIS — M9902 Segmental and somatic dysfunction of thoracic region: Secondary | ICD-10-CM | POA: Diagnosis not present

## 2022-10-09 ENCOUNTER — Other Ambulatory Visit: Payer: Self-pay | Admitting: Physical Medicine & Rehabilitation
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
## 2022-10-17 ENCOUNTER — Ambulatory Visit: Payer: BC Managed Care – PPO | Admitting: Adult Health
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
## 2022-10-18 ENCOUNTER — Ambulatory Visit: Payer: BC Managed Care – PPO | Admitting: Psychiatry

## 2022-10-18 ENCOUNTER — Ambulatory Visit: Payer: BC Managed Care – PPO | Admitting: Adult Health

## 2022-10-19 ENCOUNTER — Encounter: Payer: Self-pay | Admitting: Adult Health

## 2022-11-13 ENCOUNTER — Other Ambulatory Visit: Payer: Self-pay

## 2022-11-13 ENCOUNTER — Ambulatory Visit: Payer: BC Managed Care – PPO | Attending: Internal Medicine | Admitting: Internal Medicine

## 2022-11-13 ENCOUNTER — Encounter: Payer: Self-pay | Admitting: Internal Medicine

## 2022-11-13 VITALS — BP 110/80 | HR 100 | Temp 98.4°F | Ht 65.0 in | Wt 235.0 lb

## 2022-11-13 DIAGNOSIS — Z713 Dietary counseling and surveillance: Secondary | ICD-10-CM | POA: Insufficient documentation

## 2022-11-13 DIAGNOSIS — R079 Chest pain, unspecified: Secondary | ICD-10-CM | POA: Diagnosis not present

## 2022-11-13 DIAGNOSIS — I10 Essential (primary) hypertension: Secondary | ICD-10-CM | POA: Diagnosis not present

## 2022-11-13 DIAGNOSIS — N644 Mastodynia: Secondary | ICD-10-CM | POA: Diagnosis not present

## 2022-11-13 DIAGNOSIS — Z6839 Body mass index (BMI) 39.0-39.9, adult: Secondary | ICD-10-CM | POA: Diagnosis not present

## 2022-11-13 DIAGNOSIS — R002 Palpitations: Secondary | ICD-10-CM | POA: Diagnosis not present

## 2022-11-13 DIAGNOSIS — E669 Obesity, unspecified: Secondary | ICD-10-CM | POA: Diagnosis not present

## 2022-11-13 DIAGNOSIS — M25572 Pain in left ankle and joints of left foot: Secondary | ICD-10-CM | POA: Diagnosis not present

## 2022-11-13 NOTE — Progress Notes (Signed)
Patient ID: Kathy Conway, female    DOB: 03/29/1976  MRN: [id number]  CC: Hypertension (HTN f/u. /Pt was advised about a diagnostics mammogram. /Ran over L ankle X1 mo with pallett jack, hard area on ankle, pain to touch/Intermittent flutter in chest X2-3 weeks)   Subjective: Kathy Conway is a 47 y.o. female who presents for chronic ds management Her concerns today include:  Pt with hx of HTN, obesity, prediabetes, chronic LBP, fibromyalgia, asthma,anxiety, vertigo, mood disorder,  former tob dep and bipolar, migraines   Referred for MMG on last visit She called for appt and told them that she gets stabbing pains in both breast at times which makes them sensitive. Advise to have me reorder as diagnostic study. I referred her gyn on last visit to look at lesion on cervix.  Reports being seen and was told it was a polyp.    HTN:  not taking Cozaar daily; takes it only when she thinks she needs it Currently on light duty at work but work hrs vary Limits salt in foods C/o intermittent flutter in chest x 2-3 wks.  Can last for 30 mins.  Calms down when she takes her BP med.  Reported same on last visit but was occurring occasionally. TSH nl range  Obesity: On last visit she was down 18 pounds since May 2023.  Gained 5 lbs since last visit.  Attributes this to over eating during the holidays Drinks sweet tea, drinks soda occasionally  Injured LT ankle at work 1 mth ago Ran over it with a Pensions consultant.  Did not report the injury Ankle was bruised and swollen; went home and took Ibuprofen, iced it and elevated. Better but still a little sore when she presses on the area.    Patient Active Problem List   Diagnosis Date Noted   Recurrent boils 08/26/2021   Osteoarthritis of lumbar spine 08/06/2017   Class 3 severe obesity due to excess calories without serious comorbidity with body mass index (BMI) of 40.0 to 44.9 in adult [facility]) 08/06/2017   History of gestational diabetes 04/17/2017    Bilateral hearing loss 03/24/2017   Acute pain of right foot 03/24/2017   Insomnia 03/05/2017   Plantar fasciitis, left 10/23/2016   Chronic bilateral low back pain with bilateral sciatica 09/08/2016   IBS (irritable bowel syndrome) 05/23/2016   Asthma    Boils 03/10/2016   Anxiety state 03/10/2016   H/O degenerative disc disease    Fibromyalgia    Migraine 10/09/1996     Current Outpatient Medications on File Prior to Visit  Medication Sig Dispense Refill   amitriptyline (ELAVIL) 10 MG tablet TAKE 1 TABLET BY MOUTH EVERYDAY AT BEDTIME 90 tablet 1   losartan (COZAAR) 25 MG tablet Take 1 tablet (25 mg total) by mouth daily. 90 tablet 1   SUMAtriptan (IMITREX) 100 MG tablet Take 1 tablet (100 mg total) by mouth as needed for migraine. May repeat in 2 hours if headache persists or recurs. Max dose 2 pills in 24 hours 12 tablet 6   tiZANidine (ZANAFLEX) 4 MG tablet TAKE 1 TABLET BY MOUTH EVERY DAY 90 tablet 1   cephALEXin (KEFLEX) 500 MG capsule Take 1 capsule (500 mg total) by mouth 4 (four) times daily. 28 capsule 0   methocarbamol (ROBAXIN) 500 MG tablet Take 500 mg by mouth at bedtime as needed.     ondansetron (ZOFRAN) 4 MG tablet Take 1 tablet (4 mg total) by mouth every 8 (eight) hours  as needed for nausea or vomiting. 10 tablet 0   [DISCONTINUED] DULoxetine (CYMBALTA) 30 MG capsule Take 1 capsule (30 mg total) by mouth daily. (Patient not taking: Reported on 05/23/2018) 30 capsule 3   No current facility-administered medications on file prior to visit.    Allergies  Allergen Reactions   Codeine Shortness Of Breath   Gabapentin     Caused issues with her memory.   Iodine     Hives   Latex Itching, Swelling and Rash    Social History   Socioeconomic History   Marital status: Married    Spouse name: Not on file   Number of children: Not on file   Years of education: Not on file   Highest education level: Not on file  Occupational History   Not on file  Tobacco Use    Smoking status: Former    Packs/day: 0.50    Types: Cigarettes    Passive exposure: Current   Smokeless tobacco: Never  Vaping Use   Vaping Use: Never used  Substance and Sexual Activity   Alcohol use: Yes    Alcohol/week: 2.0 standard drinks of alcohol    Types: 2 Shots of liquor per week    Comment: occassional   Drug use: No   Sexual activity: Yes    Partners: Male    Birth control/protection: Surgical    Comment: hysterectomy  Other Topics Concern   Not on file  Social History Narrative   Right handed   Caffeine intake 5 cups   Social Determinants of Health   Financial Resource Strain: Not on file  Food Insecurity: Not on file  Transportation Needs: Not on file  Physical Activity: Not on file  Stress: Not on file  Social Connections: Not on file  Intimate Partner Violence: Not on file    Family History  Problem Relation Age of Onset   Cancer Mother    Cancer Father    Diabetes Paternal Aunt    Heart failure Maternal Grandfather     Past Surgical History:  Procedure Laterality Date   ABDOMINAL HYSTERECTOMY     has bilateral ovaries   CHOLECYSTECTOMY     TUBAL LIGATION      ROS: Review of Systems Negative except as stated above  PHYSICAL EXAM: BP 110/80   Pulse 100   Temp 98.4 F (36.9 C) (Oral)   Ht 5\' 5"  (1.651 m)   Wt 235 lb (106.6 kg)   SpO2 97%   BMI 39.11 kg/m   Wt Readings from Last 3 Encounters:  11/13/22 235 lb (106.6 kg)  07/19/22 227 lb (103 kg)  07/13/22 230 lb 9.6 oz (104.6 kg)    Physical Exam  General appearance - alert, well appearing, and in no distress Mental status - normal mood, behavior, speech, dress, motor activity, and thought processes Chest - clear to auscultation, no wheezes, rales or rhonchi, symmetric air entry Heart - normal rate, regular rhythm, normal S1, S2, no murmurs, rubs, clicks or gallops Musculoskeletal - LT ankle: No edema or erythema noted.  Good range of motion of the ankle joint in all directions.   Mild tenderness of the soft tissue posterior to the medial malleolus. Extremities - no LE edema  EKG: normal sinus rhythm, normal EKG      Latest Ref Rng & Units 03/26/2022    5:03 PM 11/15/2021   10:57 PM 06/28/2021    9:15 AM  CMP  Glucose 70 - 99 mg/dL 129  151  107   BUN 6 - 20 mg/dL 12  11  17    Creatinine 0.44 - 1.00 mg/dL 0.69  0.75  0.66   Sodium 135 - 145 mmol/L 138  141  140   Potassium 3.5 - 5.1 mmol/L 4.1  4.3  4.3   Chloride 98 - 111 mmol/L 106  105  102   CO2 22 - 32 mmol/L 24  28  23    Calcium 8.9 - 10.3 mg/dL 8.9  9.3  9.6   Total Protein 6.5 - 8.1 g/dL 6.1  6.9  6.9   Total Bilirubin 0.3 - 1.2 mg/dL 0.5  0.7  0.5   Alkaline Phos 38 - 126 U/L 64  75  79   AST 15 - 41 U/L 15  19  16    ALT 0 - 44 U/L 19  30  23     Lipid Panel     Component Value Date/Time   CHOL 201 (H) 07/13/2022 1546   TRIG 174 (H) 07/13/2022 1546   HDL 42 07/13/2022 1546   CHOLHDL 4.8 (H) 07/13/2022 1546   LDLCALC 128 (H) 07/13/2022 1546    CBC    Component Value Date/Time   WBC 10.0 03/26/2022 1703   RBC 4.06 03/26/2022 1703   HGB 12.7 03/26/2022 1703   HCT 37.6 03/26/2022 1703   PLT 231 03/26/2022 1703   MCV 92.6 03/26/2022 1703   MCH 31.3 03/26/2022 1703   MCHC 33.8 03/26/2022 1703   RDW 13.3 03/26/2022 1703   LYMPHSABS 2.7 03/26/2022 1703   MONOABS 0.5 03/26/2022 1703   EOSABS 0.4 03/26/2022 1703   BASOSABS 0.0 03/26/2022 1703    ASSESSMENT AND PLAN: 1. Essential hypertension At goal.  Encouraged her to take the Cozaar daily as prescribed.  2. Palpitations - Ambulatory referral to Cardiology - EKG 08-MVHQ - Basic Metabolic Panel  3. Breast pain in female - MM Digital Diagnostic Bilat; Future  4. Class 2 severe obesity with serious comorbidity and body mass index (BMI) of 39.0 to 39.9 in adult, unspecified obesity type (Page) Encouraged her to eliminate sugary drinks from her diet completely. Encouraged her to move is much as she can.  5. Acute left ankle  pain Better compared to 1 month ago.  Exam does not suggest any fracture or tear of ligament/tendon.  Warm compresses to soft tissue area that is still sore.     Patient was given the opportunity to ask questions.  Patient verbalized understanding of the plan and was able to repeat key elements of the plan.   This documentation was completed using Radio producer.  Any transcriptional errors are unintentional.  Orders Placed This Encounter  Procedures   MM Digital Diagnostic Bilat   Basic Metabolic Panel   Ambulatory referral to Cardiology   EKG 12-Lead     Requested Prescriptions    No prescriptions requested or ordered in this encounter    Return in about 4 months (around 03/14/2023).  Karle Plumber, MD, FACP

## 2022-11-14 ENCOUNTER — Ambulatory Visit: Payer: BC Managed Care – PPO | Admitting: Internal Medicine

## 2022-11-14 LAB — BASIC METABOLIC PANEL
BUN/Creatinine Ratio: 16 (ref 9–23)
BUN: 11 mg/dL (ref 6–24)
CO2: 23 mmol/L (ref 20–29)
Calcium: 9.7 mg/dL (ref 8.7–10.2)
Chloride: 100 mmol/L (ref 96–106)
Creatinine, Ser: 0.7 mg/dL (ref 0.57–1.00)
Glucose: 85 mg/dL (ref 70–99)
Potassium: 4.1 mmol/L (ref 3.5–5.2)
Sodium: 138 mmol/L (ref 134–144)
eGFR: 109 mL/min/{1.73_m2} (ref >59)

## 2022-11-18 ENCOUNTER — Encounter: Payer: Self-pay | Admitting: Internal Medicine

## 2022-11-20 ENCOUNTER — Other Ambulatory Visit: Payer: Self-pay | Admitting: Internal Medicine

## 2022-11-20 DIAGNOSIS — N644 Mastodynia: Secondary | ICD-10-CM

## 2022-11-24 ENCOUNTER — Encounter: Payer: Self-pay | Admitting: Internal Medicine

## 2022-11-24 ENCOUNTER — Encounter: Payer: Self-pay | Admitting: Physician Assistant

## 2022-11-24 ENCOUNTER — Telehealth (HOSPITAL_BASED_OUTPATIENT_CLINIC_OR_DEPARTMENT_OTHER): Payer: BC Managed Care – PPO | Admitting: Internal Medicine

## 2022-11-24 DIAGNOSIS — K5909 Other constipation: Secondary | ICD-10-CM

## 2022-11-24 NOTE — Progress Notes (Signed)
Patient ID: Kathy Conway, female   DOB: Jun 23, 1976, 47 y.o.   MRN: [id number] Virtual Visit via Telephone Note  I connected with Bufford Spikes on 11/24/2022 at 8:23 a.mby telephone and verified that I am speaking with the correct person using two identifiers  Location: Patient: work Provider: office  Participants: Myself Patient   I discussed the limitations, risks, security and privacy concerns of performing an evaluation and management service by telephone and the availability of in person appointments. I also discussed with the patient that there may be a patient responsible charge related to this service. The patient expressed understanding and agreed to proceed.   History of Present Illness: Pt with hx of HTN, obesity, prediabetes, chronic LBP, fibromyalgia, asthma,anxiety, vertigo, mood disorder,  former tob dep and bipolar, migraines   This was supposed be a video visit.  We did connect via video for less than a minute and then patient's audio went out so we converted to a telephone visit. This is an urgent care visit. Patient reports issues with bowel movements for the past several months.  She has been constipated where she would have a bowel movement every 3 to 4 days.  The stools are small and soft but she feels that she incompletely evacuates.  She has to use her hands to Foss/squeeze additional stools out. She has tried various over-the-counter things including Metamucil, probiotic, Benefiber, Dulcolax without much improvement.  She has increased fiber in her diet and drinks plenty of water. Outpatient Encounter Medications as of 11/24/2022  Medication Sig   amitriptyline (ELAVIL) 10 MG tablet TAKE 1 TABLET BY MOUTH EVERYDAY AT BEDTIME   cephALEXin (KEFLEX) 500 MG capsule Take 1 capsule (500 mg total) by mouth 4 (four) times daily.   losartan (COZAAR) 25 MG tablet Take 1 tablet (25 mg total) by mouth daily.   methocarbamol (ROBAXIN) 500 MG tablet Take 500 mg by mouth at  bedtime as needed.   ondansetron (ZOFRAN) 4 MG tablet Take 1 tablet (4 mg total) by mouth every 8 (eight) hours as needed for nausea or vomiting.   SUMAtriptan (IMITREX) 100 MG tablet Take 1 tablet (100 mg total) by mouth as needed for migraine. May repeat in 2 hours if headache persists or recurs. Max dose 2 pills in 24 hours   tiZANidine (ZANAFLEX) 4 MG tablet TAKE 1 TABLET BY MOUTH EVERY DAY   [DISCONTINUED] DULoxetine (CYMBALTA) 30 MG capsule Take 1 capsule (30 mg total) by mouth daily. (Patient not taking: Reported on 05/23/2018)   No facility-administered encounter medications on file as of 11/24/2022.      Observations/Objective: Patient was seen on video initially.  She appeared in no acute distress.  Assessment and Plan: 1. Chronic constipation Patient with ongoing issues with constipation despite increase fiber in the diet, adequate water intake and trial of various over-the-counter stool softeners.  I recommend using MiraLAX daily.  We will also refer to gastroenterology. - Ambulatory referral to Gastroenterology   Follow Up Instructions:    I discussed the assessment and treatment plan with the patient. The patient was provided an opportunity to ask questions and all were answered. The patient agreed with the plan and demonstrated an understanding of the instructions.   The patient was advised to call back or seek an in-person evaluation if the symptoms worsen or if the condition fails to improve as anticipated.  I  Spent 6 minutes on this telephone encounter  This note has been created with Museum/gallery curator  and smart Company secretary. Any transcriptional errors are unintentional.  Karle Plumber, MD

## 2022-12-18 ENCOUNTER — Ambulatory Visit: Payer: BC Managed Care – PPO | Admitting: Internal Medicine

## 2022-12-28 ENCOUNTER — Encounter: Payer: Self-pay | Admitting: Physician Assistant

## 2022-12-28 ENCOUNTER — Ambulatory Visit: Payer: BC Managed Care – PPO | Admitting: Physician Assistant

## 2022-12-28 ENCOUNTER — Ambulatory Visit (INDEPENDENT_AMBULATORY_CARE_PROVIDER_SITE_OTHER)
Admission: RE | Admit: 2022-12-28 | Discharge: 2022-12-28 | Disposition: A | Discharge status: Home / Self Care | Payer: BC Managed Care – PPO | Source: Ambulatory Visit | Attending: Physician Assistant | Admitting: Physician Assistant

## 2022-12-28 VITALS — BP 130/76 | HR 92 | Ht 65.0 in | Wt 242.0 lb

## 2022-12-28 DIAGNOSIS — R194 Change in bowel habit: Secondary | ICD-10-CM | POA: Diagnosis not present

## 2022-12-28 DIAGNOSIS — K5900 Constipation, unspecified: Secondary | ICD-10-CM | POA: Diagnosis not present

## 2022-12-28 DIAGNOSIS — R1084 Generalized abdominal pain: Secondary | ICD-10-CM

## 2022-12-28 DIAGNOSIS — R109 Unspecified abdominal pain: Secondary | ICD-10-CM | POA: Diagnosis not present

## 2022-12-28 MED ORDER — LINACLOTIDE 145 MCG PO CAPS: ORAL_CAPSULE | ORAL | 3 refills | Status: DC

## 2022-12-28 MED ORDER — DICYCLOMINE HCL 10 MG PO CAPS: 10.0000 mg | ORAL_CAPSULE | Freq: Two times a day (BID) | ORAL | 3 refills | Status: DC

## 2022-12-28 NOTE — Progress Notes (Signed)
Chief Complaint: Constipation  HPI:    Kathy Conway is a 47 year old female with a past medical history as listed below including fibromyalgia, degenerative disc disease and IBS, previously known to Dr. Deatra Ina, who was referred to me by Ladell Pier, MD for a complaint of constipation.     11/16/2021 CTAP without contrast showed colonic diverticulosis.  Also prior hysterectomy.    07/13/2022 TSH and T4 normal, hemoglobin A1c normal.    11/13/2022 BMP normal.    11/24/2022 patient had an office visit with her PCP and discussed several months of problems with constipation with a bowel movement once every 3 to 4 days.  Felt like she incompletely evacuated and often used her hands to squeeze additional stools out.  Tried over-the-counter product such as Metamucil, probiotic, Benefiber and Dulcolax without improvement.  Increase fiber in her diet and drink plenty of water.  At that time she was told to start MiraLAX and was referred to [place].    Today, patient presents to clinic and explains that she is just now getting over the flu and has a lot of drainage and is on Mucinex and this has changed her chronic constipation to more of a diarrhea picture having at least 2-3 looser stools/day, but even though she is doing that she still feels full and bloated and often has lower abdominal cramping and pain even into her rectum and feels full like she cannot eat anything.  Tells me that sensation is chronic for her.  She has tried multiple over-the-counter products as above including fiber supplements and stool softeners which do not seem to work that well.  MiraLAX is very hard for her to use because she feels like she can taste it whenever she is drinking.  Tells me this has been her problem for 10 years or so.  Prior to this in 2001 she apparently had a colonoscopy here (we cannot see results) and was diagnosed with IBS, at that time had diarrhea.  Tells me that the generalized abdominal discomfort is really the  biggest problem.    Denies fever, chills, weight loss, blood in her stool, nausea or vomiting.  Past Medical History:  Diagnosis Date   Arthritis    Asthma 10/1993   Fibromyalgia    H/O degenerative disc disease    IBS (irritable bowel syndrome) 10/2003   Migraine 10/1996   Renal disorder    Sciatica    Vertigo 10/2009    Past Surgical History:  Procedure Laterality Date   ABDOMINAL HYSTERECTOMY     has bilateral ovaries   CHOLECYSTECTOMY     TUBAL LIGATION      Current Outpatient Medications  Medication Sig Dispense Refill   amitriptyline (ELAVIL) 10 MG tablet TAKE 1 TABLET BY MOUTH EVERYDAY AT BEDTIME 90 tablet 1   cephALEXin (KEFLEX) 500 MG capsule Take 1 capsule (500 mg total) by mouth 4 (four) times daily. 28 capsule 0   losartan (COZAAR) 25 MG tablet Take 1 tablet (25 mg total) by mouth daily. 90 tablet 1   methocarbamol (ROBAXIN) 500 MG tablet Take 500 mg by mouth at bedtime as needed.     ondansetron (ZOFRAN) 4 MG tablet Take 1 tablet (4 mg total) by mouth every 8 (eight) hours as needed for nausea or vomiting. 10 tablet 0   SUMAtriptan (IMITREX) 100 MG tablet Take 1 tablet (100 mg total) by mouth as needed for migraine. May repeat in 2 hours if headache persists or recurs. Max dose 2 pills  in 24 hours 12 tablet 6   tiZANidine (ZANAFLEX) 4 MG tablet TAKE 1 TABLET BY MOUTH EVERY DAY 90 tablet 1   No current facility-administered medications for this visit.    Allergies as of 12/28/2022 - Review Complete 11/24/2022  Allergen Reaction Noted   Codeine Shortness Of Breath 10/17/2011   Gabapentin  01/17/2022   Iodine  07/17/2016   Latex Itching, Swelling, and Rash 10/17/2011    Family History  Problem Relation Age of Onset   Cancer Mother    Cancer Father    Diabetes Paternal Aunt    Heart failure Maternal Grandfather     Social History   Socioeconomic History   Marital status: Married    Spouse name: Not on file   Number of children: Not on file   Years  of education: Not on file   Highest education level: Not on file  Occupational History   Not on file  Tobacco Use   Smoking status: Former    Packs/day: .5    Types: Cigarettes    Passive exposure: Current   Smokeless tobacco: Never  Vaping Use   Vaping Use: Never used  Substance and Sexual Activity   Alcohol use: Yes    Alcohol/week: 2.0 standard drinks of alcohol    Types: 2 Shots of liquor per week    Comment: occassional   Drug use: No   Sexual activity: Yes    Partners: Male    Birth control/protection: Surgical    Comment: hysterectomy  Other Topics Concern   Not on file  Social History Narrative   Right handed   Caffeine intake 5 cups   Social Determinants of Health   Financial Resource Strain: Not on file  Food Insecurity: Not on file  Transportation Needs: Not on file  Physical Activity: Not on file  Stress: Not on file  Social Connections: Not on file  Intimate Partner Violence: Not on file    Review of Systems:    Constitutional: No weight loss, fever or chills Skin: No rash  Cardiovascular: No chest pain Respiratory: No SOB  Gastrointestinal: See HPI and otherwise negative Genitourinary: No dysuria Neurological: No headache, dizziness or syncope Musculoskeletal: No new muscle or joint pain Hematologic: No bleeding or bruising Psychiatric: No history of depression or anxiety   Physical Exam:  Vital signs: BP 130/76   Pulse 92   Ht 5\' 5"  (1.651 m)   Wt 242 lb (109.8 kg)   BMI 40.27 kg/m    Constitutional:   Pleasant obese Caucasian female appears to be in NAD, Well developed, Well nourished, alert and cooperative Head:  Normocephalic and atraumatic. Eyes:   PEERL, EOMI. No icterus. Conjunctiva pink. Ears:  Normal auditory acuity. Neck:  Supple Throat: Oral cavity and pharynx without inflammation, swelling or lesion.  Respiratory: Respirations even and unlabored. Lungs clear to auscultation bilaterally.   No wheezes, crackles, or rhonchi.   Cardiovascular: Normal S1, S2. No MRG. Regular rate and rhythm. No peripheral edema, cyanosis or pallor.  Gastrointestinal:  Soft, nondistended, moderate generalized TTP. No rebound or guarding. Normal bowel sounds. No appreciable masses or hepatomegaly. Rectal:  Not performed.  Msk:  Symmetrical without gross deformities. Without edema, no deformity or joint abnormality.  Neurologic:  Alert and  oriented x4;  grossly normal neurologically.  Skin:   Dry and intact without significant lesions or rashes. Psychiatric: Demonstrates good judgement and reason without abnormal affect or behaviors.  RELEVANT LABS AND IMAGING: CBC    Component Value  Date/Time   WBC 10.0 03/26/2022 1703   RBC 4.06 03/26/2022 1703   HGB 12.7 03/26/2022 1703   HCT 37.6 03/26/2022 1703   PLT 231 03/26/2022 1703   MCV 92.6 03/26/2022 1703   MCH 31.3 03/26/2022 1703   MCHC 33.8 03/26/2022 1703   RDW 13.3 03/26/2022 1703   LYMPHSABS 2.7 03/26/2022 1703   MONOABS 0.5 03/26/2022 1703   EOSABS 0.4 03/26/2022 1703   BASOSABS 0.0 03/26/2022 1703    CMP     Component Value Date/Time   NA 138 11/13/2022 1638   K 4.1 11/13/2022 1638   CL 100 11/13/2022 1638   CO2 23 11/13/2022 1638   GLUCOSE 85 11/13/2022 1638   GLUCOSE 129 (H) 03/26/2022 1703   BUN 11 11/13/2022 1638   CREATININE 0.70 11/13/2022 1638   CALCIUM 9.7 11/13/2022 1638   PROT 6.1 (L) 03/26/2022 1703   PROT 6.9 06/28/2021 0915   ALBUMIN 3.4 (L) 03/26/2022 1703   ALBUMIN 4.3 06/28/2021 0915   AST 15 03/26/2022 1703   ALT 19 03/26/2022 1703   ALKPHOS 64 03/26/2022 1703   BILITOT 0.5 03/26/2022 1703   BILITOT 0.5 06/28/2021 0915   GFRNONAA >60 03/26/2022 1703   GFRAA >60 05/01/2020 0022    Assessment: 1.  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  Bloating 3.  Generalized abdominal  discomfort  Plan: 1.  Ordered a two-view abdominal x-ray today to consider overflow constipation. 2.  Started the patient on Linzess 145 mcg daily.  She was given 2 boxes of samples a total of 8 pills.  Can send in a prescription if this works for her. 3.  Also prescribed Dicyclomine 10 mg twice daily, every morning and 30 minutes before dinner #60 with 3 refills 4.  Discussed the diagnosis of IBS with her. 5.  Pending x-ray results and symptom relief with meds as above could consider moving up her colonoscopy.  She is technically not due until October of this year for her screening, but if continues with symptoms would recommend going ahead with one now. 6.  Patient to follow in clinic with me in 2 months.  She was assigned to Dr. Candis Schatz today.  Kathy Newer, PA-C [facility] 12/28/2022, 2:34 PM  Cc: Ladell Pier, MD

## 2022-12-28 NOTE — Patient Instructions (Addendum)
_______________________________________________________  If your blood pressure at your visit was 140/90 or greater, please contact your primary care physician to follow up on this.  _______________________________________________________  If you are age 45 or older, your body mass index should be between 23-30. Your Body mass index is 40.27 kg/m. If this is out of the aforementioned range listed, please consider follow up with your Primary Care Provider.  If you are age 66 or younger, your body mass index should be between 19-25. Your Body mass index is 40.27 kg/m. If this is out of the aformentioned range listed, please consider follow up with your Primary Care Provider.   ________________________________________________________  The [facility] providers would like to encourage you to use [facility] to communicate with providers for non-urgent requests or questions.  Due to long hold times on the telephone, sending your provider a message by [facility] may be a faster and more efficient way to get a response.  Please allow 48 business hours for a response.  Please remember that this is for non-urgent requests.  _______________________________________________________  Your provider has requested that you have an abdominal x ray before leaving today. Please go to the basement floor to our Radiology department for the test.   We have given you samples of the following medication to take: Linzess 18mcg Lot [id number] date 04-2023  We have sent the following medications to your pharmacy for you to pick up at your convenience: Bentyl 10mg  2 times a day  You have been scheduled for an appointment with Ellouise Newer PA-C on 03-09-2023 at 2pm . Please arrive 10 minutes early for your appointment.  It was a pleasure to see you today!  Thank you for trusting me with your gastrointestinal care!

## 2022-12-30 NOTE — Progress Notes (Signed)
Agree with the assessment and plan as outlined by Jennifer Lemmon, PA-C.  Yamin Swingler E. Lakyn Alsteen, MD  

## 2023-01-02 ENCOUNTER — Ambulatory Visit: Payer: BC Managed Care – PPO

## 2023-01-02 ENCOUNTER — Ambulatory Visit
Admission: RE | Admit: 2023-01-02 | Discharge: 2023-01-02 | Disposition: A | Discharge status: Home / Self Care | Payer: BC Managed Care – PPO | Source: Ambulatory Visit | Attending: Internal Medicine | Admitting: Internal Medicine

## 2023-01-02 ENCOUNTER — Encounter (INDEPENDENT_AMBULATORY_CARE_PROVIDER_SITE_OTHER): Payer: Self-pay

## 2023-01-02 DIAGNOSIS — N644 Mastodynia: Secondary | ICD-10-CM

## 2023-01-06 ENCOUNTER — Other Ambulatory Visit: Payer: Self-pay | Admitting: Internal Medicine

## 2023-01-06 DIAGNOSIS — L600 Ingrowing nail: Secondary | ICD-10-CM

## 2023-01-22 ENCOUNTER — Ambulatory Visit: Payer: BC Managed Care – PPO | Attending: Internal Medicine | Admitting: Interventional Cardiology

## 2023-01-22 ENCOUNTER — Encounter: Payer: Self-pay | Admitting: Interventional Cardiology

## 2023-01-22 ENCOUNTER — Ambulatory Visit (INDEPENDENT_AMBULATORY_CARE_PROVIDER_SITE_OTHER): Payer: BC Managed Care – PPO

## 2023-01-22 VITALS — BP 138/90 | HR 110 | Ht 65.0 in | Wt 238.0 lb

## 2023-01-22 DIAGNOSIS — I700 Atherosclerosis of aorta: Secondary | ICD-10-CM

## 2023-01-22 DIAGNOSIS — I10 Essential (primary) hypertension: Secondary | ICD-10-CM | POA: Diagnosis not present

## 2023-01-22 DIAGNOSIS — K760 Fatty (change of) liver, not elsewhere classified: Secondary | ICD-10-CM | POA: Insufficient documentation

## 2023-01-22 DIAGNOSIS — R002 Palpitations: Secondary | ICD-10-CM

## 2023-01-22 MED ORDER — LOSARTAN POTASSIUM 50 MG PO TABS: 50.0000 mg | ORAL_TABLET | Freq: Every day | ORAL | 3 refills | Status: DC

## 2023-01-22 NOTE — Progress Notes (Signed)
Cardiology Office Note   Date:  01/22/2023   ID:  Kathy Conway, DOB 24-Mar-1976, MRN [id number]  PCP:  Kathy Matar, MD    No chief complaint on file.  palpitations  Wt Readings from Last 3 Encounters:  01/22/23 238 lb (108 kg)  12/28/22 242 lb (109.8 kg)  11/13/22 235 lb (106.6 kg)       History of Present Illness: Kathy Conway is a 47 y.o. female who is being seen today for the evaluation of palpitations at the request of Kathy Matar, MD.   Palpitations for the past two months.  Episodes lasting 30 to 60 minutes happening several times a week.  Associated dizziness.  No passing out.  Can have some sharp pains with the palpitations.    Denies : exertional Chest pain. Dizziness. Leg edema. Nitroglycerin use. Orthopnea.  Paroxysmal nocturnal dyspnea. Shortness of breath. Syncope.    She lifts heavy boxes at work.  This does not trigger any cardiac symptoms.    Past Medical History:  Diagnosis Date   Arthritis    Asthma 10/1993   Fibromyalgia    H/O degenerative disc disease    IBS (irritable bowel syndrome) 10/2003   Migraine 10/1996   Renal disorder    Sciatica    Vertigo 10/2009    Past Surgical History:  Procedure Laterality Date   ABDOMINAL HYSTERECTOMY     has bilateral ovaries   CHOLECYSTECTOMY     TUBAL LIGATION       Current Outpatient Medications  Medication Sig Dispense Refill   amitriptyline (ELAVIL) 10 MG tablet TAKE 1 TABLET BY MOUTH EVERYDAY AT BEDTIME 90 tablet 1   dicyclomine (BENTYL) 10 MG capsule Take 1 capsule (10 mg total) by mouth 2 (two) times daily. 60 capsule 3   linaclotide (LINZESS) 145 MCG CAPS capsule Medication Samples have been provided to the patient.  Drug name: Linzess        Strength:        Qty: 8  LOT: [id number]  Exp.Date: 04/2023  Dosing instructions: Take 1 capsule daily before breakfast   The patient has been instructed regarding the correct time, dose, and frequency of taking this  medication, including desired effects and most common side effects.   Adelene Amas Harp 3:02 PM 12/28/2022 90 capsule 3   losartan (COZAAR) 25 MG tablet Take 1 tablet (25 mg total) by mouth daily. 90 tablet 1   ondansetron (ZOFRAN) 4 MG tablet Take 1 tablet (4 mg total) by mouth every 8 (eight) hours as needed for nausea or vomiting. 10 tablet 0   SUMAtriptan (IMITREX) 100 MG tablet Take 1 tablet (100 mg total) by mouth as needed for migraine. May repeat in 2 hours if headache persists or recurs. Max dose 2 pills in 24 hours 12 tablet 6   tiZANidine (ZANAFLEX) 4 MG tablet TAKE 1 TABLET BY MOUTH EVERY DAY 90 tablet 1   cephALEXin (KEFLEX) 500 MG capsule Take 1 capsule (500 mg total) by mouth 4 (four) times daily. (Patient not taking: Reported on 12/28/2022) 28 capsule 0   methocarbamol (ROBAXIN) 500 MG tablet Take 500 mg by mouth at bedtime as needed. (Patient not taking: Reported on 12/28/2022)     No current facility-administered medications for this visit.    Allergies:   Codeine, Gabapentin, Iodine, and Latex    Social History:  The patient  reports that she has quit smoking. Her smoking use included cigarettes. She smoked an average  of .5 packs per day. She has been exposed to tobacco smoke. She has never used smokeless tobacco. She reports current alcohol use of about 2.0 standard drinks of alcohol per week. She reports that she does not use drugs.   Family History:  The patient's family history includes Brain cancer in her mother; Cancer in her father and sister; Diabetes in her paternal aunt; Heart failure in her maternal grandfather.    ROS:  Please see the history of present illness.   Otherwise, review of systems are positive for palpitations.   All other systems are reviewed and negative.    PHYSICAL EXAM: VS:  BP (!) 138/90   Pulse (!) 110   Ht  (1.651 m)   Wt 238 lb (108 kg)   SpO2 97%   BMI 39.61 kg/m  , BMI Body mass index is 39.61 kg/m. GEN: Well nourished, well  developed, in no acute distress HEENT: normal Neck: no JVD, carotid bruits, or masses Cardiac: RRR; no murmurs, rubs, or gallops,no edema  Respiratory:  clear to auscultation bilaterally, normal work of breathing GI: soft, nontender, nondistended, + BS MS: no deformity or atrophy Skin: warm and dry, no rash Neuro:  Strength and sensation are intact Psych: euthymic mood, full affect   EKG:   The ekg ordered today demonstrates sinus tach   Recent Labs: 03/26/2022: ALT 19; Hemoglobin 12.7; Platelets 231 07/13/2022: TSH 1.400 11/13/2022: BUN 11; Creatinine, Ser 0.70; Potassium 4.1; Sodium 138   Lipid Panel    Component Value Date/Time   CHOL 201 (H) 07/13/2022 1546   TRIG 174 (H) 07/13/2022 1546   HDL 42 07/13/2022 1546   CHOLHDL 4.8 (H) 07/13/2022 1546   LDLCALC 128 (H) 07/13/2022 1546     Other studies Reviewed: Additional studies/ records that were reviewed today with results demonstrating: labs reviewed.   ASSESSMENT AND PLAN:  Palpitations: plan for 14 day Zio patch.  No high risk features.  No CHF on exam.  No evidence of structural heart disease on exam.  She can have palpitations with anxiety as well.  HTN: Continue losartan.  Without meds, systolic BP in the 180s.  Home readings are still elevated, increase losartan to 50 mg daily.  Check BMet in 1-2 weeks.  Aortic atherosclerosis: LDL 128.  Consider statin.  Check lipids. Fatty liver: noted on prior CT.  Whole food, plant based diet.  Avoid processed food.  Should improve with weight loss.  Will recheck LFTs. Morbid obesity: lost 65 lbs in 90 days in 2023 with a no sugar diet. Gained some back in the winter.  She is back on the 90-day no sugar challenge.  I encouraged her to continue to avoid added sugar and foods.  Whole food, plant-based diet high fiber diet.  Avoid processed foods.   Current medicines are reviewed at length with the patient today.  The patient concerns regarding her medicines were addressed.  The  following changes have been made:  No change  Labs/ tests ordered today include:  No orders of the defined types were placed in this encounter.   Recommend 150 minutes/week of aerobic exercise Low fat, low carb, high fiber diet recommended  Disposition:   FU in 1 year   Signed, Lance Muss, MD  01/22/2023 4:06 PM    [facility] [address] [place], [place], [place]  [address] Phone: [phone]; Fax: [phone]

## 2023-01-22 NOTE — Patient Instructions (Addendum)
Medication Instructions:  Your physician has recommended you make the following change in your medication:  Increase Losartan to 50 mg by mouth daily   *If you need a refill on your cardiac medications before your next appointment, please call your pharmacy*   Lab Work: Your physician recommends that you return for lab work on  January 31, 2023.  CMET and Lipids.  This will be fasting.  You can stop by anytime after 7:15 for lab work  If you have labs (blood work) drawn today and your tests are completely normal, you will receive your results only by: MyChart Message (if you have MyChart) OR A paper copy in the mail If you have any lab test that is abnormal or we need to change your treatment, we will call you to review the results.   Testing/Procedures: Dr Eldridge Dace recommends you wear a zio monitor for 2 weeks.    Follow-Up: At [facility], you and your health needs are our priority.  As part of our continuing mission to provide you with exceptional heart care, we have created designated Provider Care Teams.  These Care Teams include your primary Cardiologist (physician) and Advanced Practice Providers (APPs -  Physician Assistants and Nurse Practitioners) who all work together to provide you with the care you need, when you need it.  We recommend signing up for the patient portal called "MyChart".  Sign up information is provided on this After Visit Summary.  MyChart is used to connect with patients for Virtual Visits (Telemedicine).  Patients are able to view lab/test results, encounter notes, upcoming appointments, etc.  Non-urgent messages can be sent to your provider as well.   To learn more about what you can do with MyChart, go to [email or web address].    Your next appointment:   Based on results  Provider:   Lance Muss, MD     Other Instructions  Christena Deem- Long Term Monitor Instructions  Your physician has requested you wear a ZIO patch monitor for 14 days.   This is a single patch monitor. [employer] supplies one patch monitor per enrollment. Additional stickers are not available. Please do not apply patch if you will be having a Nuclear Stress Test,  Echocardiogram, Cardiac CT, MRI, or Chest Xray during the period you would be wearing the  monitor. The patch cannot be worn during these tests. You cannot remove and re-apply the  ZIO XT patch monitor.  Your ZIO patch monitor will be mailed 3 day USPS to your address on file. It may take 3-5 days  to receive your monitor after you have been enrolled.  Once you have received your monitor, please review the enclosed instructions. Your monitor  has already been registered assigning a specific monitor serial # to you.  Billing and Patient Assistance Program Information  We have supplied [employer] with any of your insurance information on file for billing purposes. [employer] offers a sliding scale Patient Assistance Program for patients that do not have  insurance, or whose insurance does not completely cover the cost of the ZIO monitor.  You must apply for the Patient Assistance Program to qualify for this discounted rate.  To apply, please call [employer] at [phone], select option 4, select option 2, ask to apply for  Patient Assistance Program. Meredeth Ide will ask your household income, and how many people  are in your household. They will quote your out-of-pocket cost based on that information.  [employer] will also be able to set up a 30-month,  interest-free payment plan if needed.  Applying the monitor   Shave hair from upper left chest.  Hold abrader disc by orange tab. Rub abrader in 40 strokes over the upper left chest as  indicated in your monitor instructions.  Clean area with 4 enclosed alcohol pads. Let dry.  Apply patch as indicated in monitor instructions. Patch will be placed under collarbone on left  side of chest with arrow pointing upward.  Rub patch adhesive wings for 2 minutes. Remove  white label marked "1". Remove the white  label marked "2". Rub patch adhesive wings for 2 additional minutes.  While looking in a mirror, press and release button in center of patch. A small green light will  flash 3-4 times. This will be your only indicator that the monitor has been turned on.  Do not shower for the first 24 hours. You may shower after the first 24 hours.  Press the button if you feel a symptom. You will hear a small click. Record Date, Time and  Symptom in the Patient Logbook.  When you are ready to remove the patch, follow instructions on the last 2 pages of Patient  Logbook. Stick patch monitor onto the last page of Patient Logbook.  Place Patient Logbook in the blue and white box. Use locking tab on box and tape box closed  securely. The blue and white box has prepaid postage on it. Please place it in the mailbox as  soon as possible. Your physician should have your test results approximately 7 days after the  monitor has been mailed back to [facility].  Call [facility] Customer Care at [phone] if you have questions regarding  your ZIO XT patch monitor. Call them immediately if you see an orange light blinking on your  monitor.  If your monitor falls off in less than 4 days, contact our Monitor department at [phone].  If your monitor becomes loose or falls off after 4 days call [employer] at [phone] for  suggestions on securing your monitor

## 2023-01-22 NOTE — Progress Notes (Unsigned)
Enrolled for [employer] to mail a ZIO XT long term holter monitor to the patients address on file.  Requested delivery 01/29/23.

## 2023-01-25 ENCOUNTER — Ambulatory Visit: Payer: BC Managed Care – PPO | Admitting: Adult Health

## 2023-01-25 DIAGNOSIS — R002 Palpitations: Secondary | ICD-10-CM | POA: Diagnosis not present

## 2023-01-31 ENCOUNTER — Ambulatory Visit: Payer: BC Managed Care – PPO | Attending: Interventional Cardiology

## 2023-01-31 DIAGNOSIS — I700 Atherosclerosis of aorta: Secondary | ICD-10-CM

## 2023-01-31 DIAGNOSIS — I10 Essential (primary) hypertension: Secondary | ICD-10-CM | POA: Diagnosis not present

## 2023-01-31 DIAGNOSIS — K760 Fatty (change of) liver, not elsewhere classified: Secondary | ICD-10-CM

## 2023-01-31 DIAGNOSIS — R002 Palpitations: Secondary | ICD-10-CM | POA: Diagnosis not present

## 2023-02-01 LAB — COMPREHENSIVE METABOLIC PANEL
ALT: 18 IU/L (ref 0–32)
AST: 16 IU/L (ref 0–40)
Albumin/Globulin Ratio: 2.3 — ABNORMAL HIGH (ref 1.2–2.2)
Albumin: 4.5 g/dL (ref 3.9–4.9)
Alkaline Phosphatase: 68 IU/L (ref 44–121)
BUN/Creatinine Ratio: 24 — ABNORMAL HIGH (ref 9–23)
BUN: 16 mg/dL (ref 6–24)
Bilirubin Total: 0.4 mg/dL (ref 0.0–1.2)
CO2: 21 mmol/L (ref 20–29)
Calcium: 8.8 mg/dL (ref 8.7–10.2)
Chloride: 103 mmol/L (ref 96–106)
Creatinine, Ser: 0.67 mg/dL (ref 0.57–1.00)
Globulin, Total: 2 g/dL (ref 1.5–4.5)
Glucose: 100 mg/dL — ABNORMAL HIGH (ref 70–99)
Potassium: 4.4 mmol/L (ref 3.5–5.2)
Sodium: 140 mmol/L (ref 134–144)
Total Protein: 6.5 g/dL (ref 6.0–8.5)
eGFR: 110 mL/min/{1.73_m2} (ref >59)

## 2023-02-01 LAB — LIPID PANEL
Chol/HDL Ratio: 3.9 ratio (ref 0.0–4.4)
Cholesterol, Total: 161 mg/dL (ref 100–199)
HDL: 41 mg/dL (ref >39)
LDL Chol Calc (NIH): 86 mg/dL (ref 0–99)
Triglycerides: 199 mg/dL — ABNORMAL HIGH (ref 0–149)
VLDL Cholesterol Cal: 34 mg/dL (ref 5–40)

## 2023-02-15 ENCOUNTER — Encounter: Payer: Self-pay | Admitting: Interventional Cardiology

## 2023-02-16 ENCOUNTER — Telehealth: Payer: Self-pay | Admitting: Interventional Cardiology

## 2023-02-16 ENCOUNTER — Ambulatory Visit: Payer: BC Managed Care – PPO | Admitting: Internal Medicine

## 2023-02-16 DIAGNOSIS — R072 Precordial pain: Secondary | ICD-10-CM

## 2023-02-16 MED ORDER — PREDNISONE 50 MG PO TABS: ORAL_TABLET | ORAL | 0 refills | Status: DC

## 2023-02-16 MED ORDER — METOPROLOL TARTRATE 100 MG PO TABS: ORAL_TABLET | ORAL | 0 refills | Status: DC

## 2023-02-16 NOTE — Telephone Encounter (Signed)
Excellent BP.  Given the persistent chest pain, can order CTA coronaries.  UNsure of recent heart rates but she should tolerate up to 100 mg metoprolol x 1 based on the chart for CTA.

## 2023-02-16 NOTE — Telephone Encounter (Signed)
OK. Plan for CTA.  See message with her BP readings.

## 2023-02-16 NOTE — Telephone Encounter (Signed)
Patient notified.  She does have an iodine allergy and will need pretreatment for this.  I verbally went over all instructions with patient and will send to her through my chart.  Prescriptions for prednisone and lopressor sent to [facility] [address]  Patient aware Benadryl is OTC Patient reports she felt a lump in her chest that is painful when she presses on it. She will follow up with PCP for this.

## 2023-02-16 NOTE — Telephone Encounter (Signed)
-----   Message from Corky Crafts, MD sent at 02/16/2023 12:19 PM EDT ----- Normal sinus rhythm with rare PACs and rare PVCs.   Symptoms correlated to sinus rhythm.   No symptoms with PACs, PVCs or the single brief run of PVCs noted.   No atrial fibrillation.   No sustained arrhythmias.

## 2023-02-16 NOTE — Telephone Encounter (Signed)
Monitor results reviewed with patient.  She reports she continues to have both a dull and sharp pain in her chest. Same as when she saw Dr Eldridge Dace.  Not related to exertion and usually occurs when she is resting.  She reports episodes of heart pounding.  With heart pounding she feels light headed and nauseous.  I told patient message would be sent to Dr Eldridge Dace and we would call her back if additional testing needed

## 2023-02-16 NOTE — Telephone Encounter (Signed)
Patient returned RN's call regarding monitor results.

## 2023-02-27 ENCOUNTER — Encounter (HOSPITAL_COMMUNITY): Payer: Self-pay

## 2023-02-27 ENCOUNTER — Ambulatory Visit (HOSPITAL_COMMUNITY): Admission: RE | Admit: 2023-02-27 | Payer: BC Managed Care – PPO | Source: Ambulatory Visit

## 2023-03-05 ENCOUNTER — Emergency Department (HOSPITAL_COMMUNITY): Payer: BC Managed Care – PPO

## 2023-03-05 ENCOUNTER — Encounter (HOSPITAL_COMMUNITY): Payer: Self-pay

## 2023-03-05 ENCOUNTER — Other Ambulatory Visit: Payer: Self-pay

## 2023-03-05 ENCOUNTER — Emergency Department (HOSPITAL_COMMUNITY)
Admission: EM | Admit: 2023-03-05 | Discharge: 2023-03-06 | Disposition: A | Discharge status: Home / Self Care | Payer: BC Managed Care – PPO | Attending: Emergency Medicine | Admitting: Emergency Medicine

## 2023-03-05 DIAGNOSIS — R0789 Other chest pain: Secondary | ICD-10-CM | POA: Insufficient documentation

## 2023-03-05 DIAGNOSIS — Z91040 Latex allergy status: Secondary | ICD-10-CM | POA: Insufficient documentation

## 2023-03-05 DIAGNOSIS — R079 Chest pain, unspecified: Secondary | ICD-10-CM | POA: Diagnosis not present

## 2023-03-05 DIAGNOSIS — R918 Other nonspecific abnormal finding of lung field: Secondary | ICD-10-CM | POA: Diagnosis not present

## 2023-03-05 LAB — CBC WITH DIFFERENTIAL/PLATELET
Abs Immature Granulocytes: 0.05 10*3/uL (ref 0.00–0.07)
Basophils Absolute: 0 10*3/uL (ref 0.0–0.1)
Basophils Relative: 0 %
Eosinophils Absolute: 0.3 10*3/uL (ref 0.0–0.5)
Eosinophils Relative: 2 %
HCT: 38.5 % (ref 36.0–46.0)
Hemoglobin: 13.4 g/dL (ref 12.0–15.0)
Immature Granulocytes: 1 %
Lymphocytes Relative: 21 %
Lymphs Abs: 2.3 10*3/uL (ref 0.7–4.0)
MCH: 32 pg (ref 26.0–34.0)
MCHC: 34.8 g/dL (ref 30.0–36.0)
MCV: 91.9 fL (ref 80.0–100.0)
Monocytes Absolute: 0.5 10*3/uL (ref 0.1–1.0)
Monocytes Relative: 5 %
Neutro Abs: 7.8 10*3/uL — ABNORMAL HIGH (ref 1.7–7.7)
Neutrophils Relative %: 71 %
Platelets: 187 10*3/uL (ref 150–400)
RBC: 4.19 MIL/uL (ref 3.87–5.11)
RDW: 12.6 % (ref 11.5–15.5)
WBC: 11 10*3/uL — ABNORMAL HIGH (ref 4.0–10.5)
nRBC: 0 % (ref 0.0–0.2)

## 2023-03-05 LAB — BASIC METABOLIC PANEL
Anion gap: 11 (ref 5–15)
BUN: 12 mg/dL (ref 6–20)
CO2: 24 mmol/L (ref 22–32)
Calcium: 9.1 mg/dL (ref 8.9–10.3)
Chloride: 104 mmol/L (ref 98–111)
Creatinine, Ser: 0.66 mg/dL (ref 0.44–1.00)
GFR, Estimated: >60 mL/min (ref >60)
Glucose, Bld: 132 mg/dL — ABNORMAL HIGH (ref 70–99)
Potassium: 3.7 mmol/L (ref 3.5–5.1)
Sodium: 139 mmol/L (ref 135–145)

## 2023-03-05 NOTE — ED Triage Notes (Signed)
Pt arrived from home via POV c/o lump noted on chest that has been there for approx 1 month right on top of breast bone. Pt states that she is unable to lay flat now d/t the pressure from the mass. Pt states that she has lost her appetite d/t the pressure from the lump recently and the feeling of food not clearing or from an acid reflux.

## 2023-03-06 ENCOUNTER — Emergency Department (HOSPITAL_COMMUNITY): Payer: BC Managed Care – PPO

## 2023-03-06 DIAGNOSIS — R918 Other nonspecific abnormal finding of lung field: Secondary | ICD-10-CM | POA: Diagnosis not present

## 2023-03-06 MED ORDER — MELOXICAM 15 MG PO TABS: 15.0000 mg | ORAL_TABLET | Freq: Every day | ORAL | 0 refills | Status: DC

## 2023-03-06 MED ORDER — HYDROCODONE-ACETAMINOPHEN 5-325 MG PO TABS
2.0000 | ORAL_TABLET | Freq: Once | ORAL | Status: AC
  Administered 2023-03-06: 2 via ORAL
  Filled 2023-03-06: qty 2

## 2023-03-06 NOTE — ED Provider Notes (Signed)
[place] EMERGENCY DEPARTMENT AT [facility] Provider Note   CSN: [id number] Arrival date & time: 03/05/23  2013     History  Chief Complaint  Patient presents with   lump on chest    Kathy Conway is a 47 y.o. female.  Patient presents to the emergency department for evaluation of chest pain.  Patient reports that symptoms have been present for about a month.  It started after her sister taught her how to do CPR on herself.  Patient reports that she has a knot in the center of her lower chest that is tender and is getting bigger.       Home Medications Prior to Admission medications   Medication Sig Start Date End Date Taking? Authorizing Provider  amitriptyline (ELAVIL) 10 MG tablet TAKE 1 TABLET BY MOUTH EVERYDAY AT BEDTIME 02/08/22   Marcine Matar, MD  dicyclomine (BENTYL) 10 MG capsule Take 1 capsule (10 mg total) by mouth 2 (two) times daily. 12/28/22   Unk Lightning, PA  linaclotide [facility]) 145 MCG CAPS capsule Medication Samples have been provided to the patient.  Drug name: Linzess        Strength:        Qty: 8  LOT: [id number]  Exp.Date: 04/2023  Dosing instructions: Take 1 capsule daily before breakfast   The patient has been instructed regarding the correct time, dose, and frequency of taking this medication, including desired effects and most common side effects.   Adelene Amas Harp 3:02 PM 12/28/2022 12/28/22   Unk Lightning, PA  losartan (COZAAR) 50 MG tablet Take 1 tablet (50 mg total) by mouth daily. 01/22/23   Corky Crafts, MD  meloxicam (MOBIC) 15 MG tablet Take 1 tablet (15 mg total) by mouth daily. 03/06/23  Yes Haislee Corso, Canary Brim, MD  metoprolol tartrate (LOPRESSOR) 100 MG tablet Take one tablet by mouth 2 hours prior to CT scan 02/16/23   Corky Crafts, MD  ondansetron (ZOFRAN) 4 MG tablet Take 1 tablet (4 mg total) by mouth every 8 (eight) hours as needed for nausea or vomiting. 03/27/22   Arthor Captain, PA-C  predniSONE (DELTASONE) 50 MG tablet Take one tablet by mouth 13 hours, 7 hours and one hour prior to test 02/16/23   Corky Crafts, MD  SUMAtriptan (IMITREX) 100 MG tablet Take 1 tablet (100 mg total) by mouth as needed for migraine. May repeat in 2 hours if headache persists or recurs. Max dose 2 pills in 24 hours 03/29/22   Ocie Doyne, MD  tiZANidine (ZANAFLEX) 4 MG tablet TAKE 1 TABLET BY MOUTH EVERY DAY 10/12/22   Fanny Dance, MD  DULoxetine (CYMBALTA) 30 MG capsule Take 1 capsule (30 mg total) by mouth daily. Patient not taking: Reported on 05/23/2018 12/06/17 10/21/20  Marcine Matar, MD      Allergies    Codeine, Gabapentin, Iodine, and Latex    Review of Systems   Review of Systems  Physical Exam Updated Vital Signs BP (!) 130/96   Pulse 78   Temp (!) 97.2 F (36.2 C) (Oral)   Resp 16   Ht 5\' 5"  (1.651 m)   Wt 110 kg   SpO2 100%   BMI 40.36 kg/m  Physical Exam Vitals and nursing note reviewed.  Constitutional:      General: She is not in acute distress.    Appearance: She is well-developed.  HENT:     Head: Normocephalic and atraumatic.  Mouth/Throat:     Mouth: Mucous membranes are moist.  Eyes:     General: Vision grossly intact. Gaze aligned appropriately.     Extraocular Movements: Extraocular movements intact.     Conjunctiva/sclera: Conjunctivae normal.  Cardiovascular:     Rate and Rhythm: Normal rate and regular rhythm.     Pulses: Normal pulses.     Heart sounds: Normal heart sounds, S1 normal and S2 normal. No murmur heard.    No friction rub. No gallop.  Pulmonary:     Effort: Pulmonary effort is normal. No respiratory distress.     Breath sounds: Normal breath sounds.  Chest:    Abdominal:     General: Bowel sounds are normal.     Palpations: Abdomen is soft.     Tenderness: There is no abdominal tenderness. There is no guarding or rebound.     Hernia: No hernia is present.  Musculoskeletal:        General:  No swelling.     Cervical back: Full passive range of motion without pain, normal range of motion and neck supple. No spinous process tenderness or muscular tenderness. Normal range of motion.     Right lower leg: No edema.     Left lower leg: No edema.  Skin:    General: Skin is warm and dry.     Capillary Refill: Capillary refill takes less than 2 seconds.     Findings: No ecchymosis, erythema, rash or wound.  Neurological:     General: No focal deficit present.     Mental Status: She is alert and oriented to person, place, and time.     GCS: GCS eye subscore is 4. GCS verbal subscore is 5. GCS motor subscore is 6.     Cranial Nerves: Cranial nerves 2-12 are intact.     Sensory: Sensation is intact.     Motor: Motor function is intact.     Coordination: Coordination is intact.  Psychiatric:        Attention and Perception: Attention normal.        Mood and Affect: Mood normal.        Speech: Speech normal.        Behavior: Behavior normal.     ED Results / Procedures / Treatments   Labs (all labs ordered are listed, but only abnormal results are displayed) Labs Reviewed  CBC WITH DIFFERENTIAL/PLATELET - Abnormal; Notable for the following components:      Result Value   WBC 11.0 (*)    Neutro Abs 7.8 (*)    All other components within normal limits  BASIC METABOLIC PANEL - Abnormal; Notable for the following components:   Glucose, Bld 132 (*)    All other components within normal limits    EKG None  Radiology CT Chest Wo Contrast  Result Date: 03/06/2023 CLINICAL DATA:  Chest wall mass, US/xray nondiagnostic EXAM: CT CHEST WITHOUT CONTRAST TECHNIQUE: Multidetector CT imaging of the chest was performed following the standard protocol without IV contrast. RADIATION DOSE REDUCTION: This exam was performed according to the departmental dose-optimization program which includes automated exposure control, adjustment of the mA and/or kV according to patient size and/or use of  iterative reconstruction technique. COMPARISON:  Chest x-ray today. FINDINGS: Cardiovascular: Heart is normal size. Aorta is normal caliber. Mediastinum/Nodes: No mediastinal, hilar, or axillary adenopathy. Trachea and esophagus are unremarkable. Thyroid unremarkable. Lungs/Pleura: 5 mm nodule in the left lower lobe on image 80 of series 4. No confluent opacities or  effusions. Upper Abdomen: No acute findings Musculoskeletal: Chest wall soft tissues are unremarkable. No chest wall mass visualized. No acute bony abnormality. IMPRESSION: No chest wall mass visualized. No acute cardiopulmonary disease. Electronically Signed   By: Charlett Nose M.D.   On: 03/06/2023 03:02   DG Chest 2 View  Result Date: 03/05/2023 CLINICAL DATA:  Sternal pain. EXAM: CHEST - 2 VIEW COMPARISON:  October 20, 2020 FINDINGS: The heart size and mediastinal contours are within normal limits. Both lungs are clear. The visualized skeletal structures are unremarkable. IMPRESSION: No active cardiopulmonary disease. Electronically Signed   By: Aram Candela M.D.   On: 03/05/2023 21:04    Procedures Procedures    Medications Ordered in ED Medications  HYDROcodone-acetaminophen (NORCO/VICODIN) 5-325 MG per tablet 2 tablet (has no administration in time range)    ED Course/ Medical Decision Making/ A&P                             Medical Decision Making Amount and/or Complexity of Data Reviewed Labs: ordered. Radiology: ordered.   Differential Diagnosis considered includes, but not limited to: STEMI; NSTEMI; pulmonary embolism; aortic dissection; pneumothorax; pneumonia; gastritis; musculoskeletal pain  Patient reports that she has a knot at the central portion of her lower chest that is tender to the touch.  I do not feel a discrete nodule in this area but she is tender at the xiphoid process region.  No overlying skin changes to suggest abscess or cellulitis.  Symptoms ongoing for a month.  Doubt cardiac etiology.  No  shortness of breath.  CT scan performed.  No mass noted, no abnormality.  Will treat with anti-inflammatories, follow-up with primary care.  It appears that the patient also as an outpatient CTA coronary study pending which can further help delineate her pain.        Final Clinical Impression(s) / ED Diagnoses Final diagnoses:  Chest wall pain    Rx / DC Orders ED Discharge Orders          Ordered    meloxicam (MOBIC) 15 MG tablet  Daily        03/06/23 0311              Gilda Crease, MD 03/06/23 [phone]

## 2023-03-08 ENCOUNTER — Telehealth: Payer: Self-pay

## 2023-03-08 ENCOUNTER — Encounter (HOSPITAL_COMMUNITY): Payer: Self-pay

## 2023-03-08 ENCOUNTER — Ambulatory Visit (HOSPITAL_COMMUNITY): Admission: RE | Admit: 2023-03-08 | Payer: BC Managed Care – PPO | Source: Ambulatory Visit

## 2023-03-08 NOTE — Transitions of Care (Post Inpatient/ED Visit) (Signed)
03/08/2023  Name: Kathy Conway MRN: [id number] DOB: May 14, 1976  Today's TOC FU Call Status: Today's TOC FU Call Status:: Successful TOC FU Call Competed TOC FU Call Complete Date: 03/08/23  Red on EMMI-ED Discharge Alert Date & Reason:03/07/23 "Scheduled follow-up appt? No"   Transition Care Management Follow-up Telephone Call Date of Discharge: 03/06/23 Discharge Facility: [facility]) Type of Discharge: Emergency Department Reason for ED Visit: Other: ("chest wall pain") How have you been since you were released from the hospital?: Better (Pt states overall "doing a little better-still has some of the chest discomfort at times"-States "feels like air bubbles in chest-only happens when eating & drinking"-admits to eating fast due to rushing all the time-states "Mobic doesn't work") Any questions or concerns?: No  Items Reviewed: Did you receive and understand the discharge instructions provided?: Yes Dietary orders reviewed?: Yes Type of Diet Ordered:: low salt/heart healthy Do you have support at home?: Yes People in Home: spouse  Medications Reviewed Today: Medications Reviewed Today     Reviewed by Charlyn Minerva, RN (Registered Nurse) on 03/08/23 at 1438  Med List Status: <None>   Medication Order Taking? Sig Documenting Provider Last Dose Status Informant  amitriptyline (ELAVIL) 10 MG tablet [id number] No TAKE 1 TABLET BY MOUTH EVERYDAY AT BEDTIME Marcine Matar, MD Taking Active   dicyclomine (BENTYL) 10 MG capsule [id number] No Take 1 capsule (10 mg total) by mouth 2 (two) times daily. Unk Lightning, [place] Taking Active   Patient not taking:  Discontinued 10/21/20 0441 linaclotide (LINZESS) 145 MCG CAPS capsule [id number] No Medication Samples have been provided to the patient.  Drug name: Linzess        Strength:        Qty: 8  LOT: [id number]  Exp.Date: 04/2023  Dosing instructions: Take 1 capsule daily before breakfast   The patient  has been instructed regarding the correct time, dose, and frequency of taking this medication, including desired effects and most common side effects.   Adelene Amas Harp 3:02 PM 12/28/2022 Unk Lightning, PA Taking Active   losartan (COZAAR) 50 MG tablet [id number]  Take 1 tablet (50 mg total) by mouth daily. Corky Crafts, MD  Active   meloxicam [facility]) 15 MG tablet [id number]  Take 1 tablet (15 mg total) by mouth daily. Gilda Crease, MD  Active   metoprolol tartrate (LOPRESSOR) 100 MG tablet [id number]  Take one tablet by mouth 2 hours prior to CT scan Corky Crafts, MD  Active   ondansetron [facility]) 4 MG tablet [id number] No Take 1 tablet (4 mg total) by mouth every 8 (eight) hours as needed for nausea or vomiting. Arthor Captain, PA-C Taking Active            Med Note (RATLIFF, THURSHELL   Thu Dec 28, 2022  2:42 PM) As needed  predniSONE (DELTASONE) 50 MG tablet [id number]  Take one tablet by mouth 13 hours, 7 hours and one hour prior to test Corky Crafts, MD  Active   SUMAtriptan (IMITREX) 100 MG tablet [id number] No Take 1 tablet (100 mg total) by mouth as needed for migraine. May repeat in 2 hours if headache persists or recurs. Max dose 2 pills in 24 hours Ocie Doyne, MD Taking Active   tiZANidine (ZANAFLEX) 4 MG tablet [id number] No TAKE 1 TABLET BY MOUTH EVERY DAY Fanny Dance, MD Taking Active             Home Care and Equipment/Supplies:  Any new equipment or medical supplies ordered?: NA  Functional Questionnaire: Do you need assistance with bathing/showering or dressing?: No Do you need assistance with meal preparation?: No Do you need assistance with eating?: No Do you have difficulty maintaining continence: No Do you need assistance with getting out of bed/getting out of a chair/moving?: No Do you have difficulty managing or taking your medications?: No  Follow up appointments reviewed: PCP Follow-up appointment confirmed?: Yes  (Pt requested virutal visit with PCP due to her work schedule-care guide assisted with making appt) Date of PCP follow-up appointment?: 03/09/23 Follow-up Provider: Dr. Laural Benes Specialist [facility] Follow-up appointment confirmed?: Yes Date of Specialist follow-up appointment?: 06/07/23 (pt states GI office had to reschedule her appt for 03/09/23 to Aug-on wait list for cancellation/sooner appt) Follow-Up Specialty Provider:: Jeanne Ivan Do you need transportation to your follow-up appointment?: No Do you understand care options if your condition(s) worsen?: Yes-patient verbalized understanding  SDOH Interventions Today    Flowsheet Row Most Recent Value  SDOH Interventions   Food Insecurity Interventions Intervention Not Indicated  Transportation Interventions Intervention Not Indicated       TOC Interventions Today    Flowsheet Row Most Recent Value  TOC Interventions   TOC Interventions Discussed/Reviewed Arranged PCP follow up within 7 days/Care Guide scheduled, TOC Interventions Discussed      Interventions Today    Flowsheet Row Most Recent Value  General Interventions   General Interventions Discussed/Reviewed General Interventions Discussed, Doctor Visits  Doctor Visits Discussed/Reviewed PCP, Doctor Visits Discussed, Specialist  PCP/Specialist Visits Compliance with follow-up visit  Education Interventions   Education Provided Provided Education  Provided Verbal Education On Nutrition, When to see the doctor, Medication  Nutrition Interventions   Nutrition Discussed/Reviewed Nutrition Discussed  Pharmacy Interventions   Pharmacy Dicussed/Reviewed Pharmacy Topics Discussed, Medications and their functions  Safety Interventions   Safety Discussed/Reviewed Safety Discussed        Alessandra Grout [facility] Care Management Care Management Community Coordinator Direct Phone: [phone] Toll Free: [phone] Fax: [phone]

## 2023-03-09 ENCOUNTER — Telehealth (HOSPITAL_BASED_OUTPATIENT_CLINIC_OR_DEPARTMENT_OTHER): Payer: BC Managed Care – PPO | Admitting: Internal Medicine

## 2023-03-09 ENCOUNTER — Ambulatory Visit: Payer: BC Managed Care – PPO | Admitting: Physician Assistant

## 2023-03-09 DIAGNOSIS — R911 Solitary pulmonary nodule: Secondary | ICD-10-CM | POA: Diagnosis not present

## 2023-03-09 DIAGNOSIS — R0789 Other chest pain: Secondary | ICD-10-CM

## 2023-03-09 MED ORDER — OMEPRAZOLE 20 MG PO CPDR: 20.0000 mg | DELAYED_RELEASE_CAPSULE | Freq: Every day | ORAL | 0 refills | Status: DC

## 2023-03-09 NOTE — Progress Notes (Signed)
Patient ID: Kathy Conway, female   DOB: 1976-07-13, 47 y.o.   MRN: [id number] Virtual Visit via Video Note  I connected with Kathy Conway on 03/09/2023 at 8:35 AM by a video enabled telemedicine application and verified that I am speaking with the correct person using two identifiers.  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 Provider: Office   I discussed the limitations of evaluation and management by telemedicine and the availability of in person appointments. The patient expressed understanding and agreed to proceed.  History of Present Illness: Pt with hx of HTN, obesity, prediabetes, chronic LBP, fibromyalgia, asthma,anxiety, vertigo, mood disorder,  former tob dep and bipolar, migraines. This was a follow-up visit from the emergency room.  Patient was seen in the emergency room 03/06/2023 for pain/soreness and nodule over the center of the lower chest.  Patient states her sister was showing her how to do the Heimlich maneuver on herself.  Patient states that she was pressing over the lower sternum when she felt a soreness/pain there.  In the emergency room, no nodule was appreciated over the xiphoid process.  She had tenderness over the xiphoid process.  Chest x-ray 2 views negative.  CT of the chest negative except for incidental finding of 5 mm left lower lobe nodule.  Patient told this was likely inflammation.  Sent home on meloxicam. Today: Patient states that when she eats it feels like there is a bubble there with some pressure.  However it is getting better with the meloxicam. Wanted to know if she needs follow-up on the nodule seen in the left lower lobe of the lungs.  She quit smoking 3 years ago.  She was a light smoker.  Stated that 1 pack would last 1 week.    Outpatient Encounter Medications as of 03/09/2023  Medication Sig Note   amitriptyline (ELAVIL) 10 MG tablet TAKE 1 TABLET BY MOUTH EVERYDAY AT BEDTIME    dicyclomine (BENTYL) 10 MG capsule  Take 1 capsule (10 mg total) by mouth 2 (two) times daily.    linaclotide (LINZESS) 145 MCG CAPS capsule Medication Samples have been provided to the patient.  Drug name: Linzess        Strength:        Qty: 8  LOT: [id number]  Exp.Date: 04/2023  Dosing instructions: Take 1 capsule daily before breakfast   The patient has been instructed regarding the correct time, dose, and frequency of taking this medication, including desired effects and most common side effects.   Brooke E Harp 3:02 PM 12/28/2022    losartan (COZAAR) 50 MG tablet Take 1 tablet (50 mg total) by mouth daily.    meloxicam (MOBIC) 15 MG tablet Take 1 tablet (15 mg total) by mouth daily.    metoprolol tartrate (LOPRESSOR) 100 MG tablet Take one tablet by mouth 2 hours prior to CT scan    ondansetron (ZOFRAN) 4 MG tablet Take 1 tablet (4 mg total) by mouth every 8 (eight) hours as needed for nausea or vomiting. 12/28/2022: As needed   predniSONE (DELTASONE) 50 MG tablet Take one tablet by mouth 13 hours, 7 hours and one hour prior to test    SUMAtriptan (IMITREX) 100 MG tablet Take 1 tablet (100 mg total) by mouth as needed for migraine. May repeat in 2 hours if headache persists or recurs. Max dose 2 pills in 24 hours    tiZANidine (ZANAFLEX) 4 MG tablet TAKE 1 TABLET BY MOUTH EVERY DAY    [  DISCONTINUED] DULoxetine (CYMBALTA) 30 MG capsule Take 1 capsule (30 mg total) by mouth daily. (Patient not taking: Reported on 05/23/2018)    No facility-administered encounter medications on file as of 03/09/2023.      Observations/Objective: Middle-age obese Caucasian female in NAD  Assessment and Plan: 1. Other chest pain Patient reporting pain over the lower sternum after she attempted to do Heimlich maneuver on herself.  Imaging studies did not reveal any fracture of the xiphoid or sternum.  No air bubbles seen around the esophagus or the trachea to suggest rupture.  She reports symptoms are improving with the meloxicam.  I  recommend adding 1 month of omeprazole to the meloxicam given some of the GI symptoms with it.  Follow-up if no improvement. - omeprazole (PRILOSEC) 20 MG capsule; Take 1 capsule (20 mg total) by mouth daily.  Dispense: 30 capsule; Refill: 0  2. Lung nodule, solitary Patient quit smoking 3 years ago and has a less than 20-pack-year history of smoking.  Advised that we do not need to follow-up on the nodule with repeat imaging.  However patient states she would feel better peace of mind if we did.  I told her that we could repeat the CAT scan in 1 year to assess for any growth changes.  She expressed understanding.   Follow Up Instructions: As previously scheduled.   I discussed the assessment and treatment plan with the patient. The patient was provided an opportunity to ask questions and all were answered. The patient agreed with the plan and demonstrated an understanding of the instructions.   The patient was advised to call back or seek an in-person evaluation if the symptoms worsen or if the condition fails to improve as anticipated.  I spent 14 minutes dedicated to the care of this patient on the date of this encounter to include previsit review of review of ER note and tests, face-to-face time with the patient and postvisit entering of orders.  This note has been created with Education officer, environmental. Any transcriptional errors are unintentional.  Jonah Blue, MD

## 2023-03-26 ENCOUNTER — Other Ambulatory Visit: Payer: Self-pay | Admitting: Physician Assistant

## 2023-03-30 ENCOUNTER — Telehealth (HOSPITAL_COMMUNITY): Payer: Self-pay | Admitting: *Deleted

## 2023-03-30 NOTE — Telephone Encounter (Signed)
Attempted to call patient regarding upcoming cardiac CT appointment. Left message on voicemail with name and callback number Abhimanyu Cruces RN Navigator Cardiac Imaging [facility] [phone] Office  

## 2023-04-02 ENCOUNTER — Ambulatory Visit (HOSPITAL_COMMUNITY)
Admission: RE | Admit: 2023-04-02 | Discharge: 2023-04-02 | Disposition: A | Discharge status: Home / Self Care | Payer: BC Managed Care – PPO | Source: Ambulatory Visit | Attending: Interventional Cardiology | Admitting: Interventional Cardiology

## 2023-04-02 DIAGNOSIS — R931 Abnormal findings on diagnostic imaging of heart and coronary circulation: Secondary | ICD-10-CM | POA: Insufficient documentation

## 2023-04-02 DIAGNOSIS — I2510 Atherosclerotic heart disease of native coronary artery without angina pectoris: Secondary | ICD-10-CM | POA: Diagnosis not present

## 2023-04-02 DIAGNOSIS — R072 Precordial pain: Secondary | ICD-10-CM | POA: Insufficient documentation

## 2023-04-02 MED ORDER — IOHEXOL 350 MG/ML SOLN
100.0000 mL | Freq: Once | INTRAVENOUS | Status: AC | PRN
  Administered 2023-04-02: 100 mL via INTRAVENOUS

## 2023-04-02 MED ORDER — NITROGLYCERIN 0.4 MG SL SUBL
0.8000 mg | SUBLINGUAL_TABLET | Freq: Once | SUBLINGUAL | Status: AC
  Administered 2023-04-02: 0.8 mg via SUBLINGUAL

## 2023-04-02 MED ORDER — DIPHENHYDRAMINE HCL 50 MG/ML IJ SOLN
25.0000 mg | Freq: Once | INTRAMUSCULAR | Status: AC
  Administered 2023-04-02: 25 mg via INTRAVENOUS

## 2023-04-02 MED ORDER — NITROGLYCERIN 0.4 MG SL SUBL
SUBLINGUAL_TABLET | SUBLINGUAL | Status: AC
  Filled 2023-04-02: qty 2

## 2023-04-02 MED ORDER — DIPHENHYDRAMINE HCL 50 MG/ML IJ SOLN
INTRAMUSCULAR | Status: AC
  Filled 2023-04-02: qty 1

## 2023-04-02 NOTE — Progress Notes (Signed)
Patient presented today for CT heart study. She has a contrast allergy and did the prednisone pre-med regimen but did not take the benadryl due to feeling tired at work and not wanting to drive after taking benadryl. The patient stated she had done a contrast study before with just prednisone and she did not have a reaction.   I discussed with the patient that we could proceed with the imaging study but if she experiences a reaction she would need treatment with IV benadryl and she verbalized understanding. The CT study was completed and within a few minutes she developed diffuse pruritus, chest discomfort and shortness of breath. 25 mg IV benadryl administered by Phineas Semen, RN. Patient placed on the cardiac monitor and vital signs were all within acceptable limits. 2L oxygen via nasal cannula started for comfort. Within approximately 1 minute after IV benadryl administration the patient's symptoms almost completely subsided.   Patient monitored closely for approximately 10-15 minutes and all symptoms had resolved. Patient will be monitored for another 30 minutes per Dr. Loreta Ave and patient will find a ride home. Patient made aware that due to IV benadryl she should not drive herself home for safety reasons. Patient also made aware that she may experience a delayed contrast reaction that could occur over the next several days. She was advised to seek immediate medical attention (go to the ED or call 911) if she develops any symptoms of a contrast reaction - pruritus, chest pain, difficulty breathing, etc. Patient acknowledged understanding of these instructions. Patient also given the number to the IR APP office at [facility] to call for non-emergency questions related to today's procedure and/or her contrast reaction.   Patient also advised to take all contrast pre-meds prior to next imaging study that uses IV contrast.   Alwyn Ren, AGACNP-BC [phone] 04/02/2023, 4:39 PM

## 2023-04-02 NOTE — Progress Notes (Signed)
Pt came in for a CTA of the heart. Pt did not have entire 13 hour prep, but patient did not take the benadryl. Pt states she normally will take the benadryl when she gets home. RN talked with Valetta Close, NP who came and talked with pt, and they agreed pt could have the scan and take the benadryl at home, but if an allergic reaction were to occur, she may need to get IV benadryl and find a ride home. Pt was agreeable. Pt taken to CT and scan was completed. After the contrast, patient stated she has itching all over and her chest is getting tight. Valetta Close, NP called and came to assess pt. Pt was placed on cardiac monitor, BP monitor, pulse ox monitor and 2LPM of oxygen for comfort. 1616 Vitals obtained and pt is working on having family come to pick her up. IV benadryl was given, please see MAR.  At 1616 pt states she is feeling better with the itching going down and the chest pressure gone.   Asher Muir NP back talking with pt at 1626 and pt informed she needs to be monitored for at least 30 minutes and will need a drive home. Non-emergent provider number provided to patient. NP stated she will also place a progress note. Please see their note for more information.   1632 oxygen removed. Pt states she does not feel like she needs it. Oxygen at this time is 97% on RA. Report given to [place], RN

## 2023-04-03 ENCOUNTER — Ambulatory Visit (HOSPITAL_BASED_OUTPATIENT_CLINIC_OR_DEPARTMENT_OTHER)
Admission: RE | Admit: 2023-04-03 | Discharge: 2023-04-03 | Disposition: A | Discharge status: Home / Self Care | Payer: BC Managed Care – PPO | Source: Ambulatory Visit | Attending: Internal Medicine | Admitting: Internal Medicine

## 2023-04-03 ENCOUNTER — Other Ambulatory Visit: Payer: Self-pay | Admitting: Internal Medicine

## 2023-04-03 DIAGNOSIS — R931 Abnormal findings on diagnostic imaging of heart and coronary circulation: Secondary | ICD-10-CM

## 2023-04-03 DIAGNOSIS — R072 Precordial pain: Secondary | ICD-10-CM | POA: Diagnosis not present

## 2023-04-04 ENCOUNTER — Telehealth: Payer: Self-pay | Admitting: *Deleted

## 2023-04-04 DIAGNOSIS — E785 Hyperlipidemia, unspecified: Secondary | ICD-10-CM

## 2023-04-04 MED ORDER — ROSUVASTATIN CALCIUM 20 MG PO TABS: 20.0000 mg | ORAL_TABLET | Freq: Every day | ORAL | 3 refills | Status: DC

## 2023-04-04 NOTE — Telephone Encounter (Signed)
Patient notified.  She will come in for fasting lab work on 07/03/23.  Prescription sent to [facility] on [address].  Patient will follow up with Dr Eldridge Dace in one year or sooner if any problems.

## 2023-04-04 NOTE — Telephone Encounter (Signed)
-----   Message from Corky Crafts, MD sent at 04/04/2023  3:12 PM EDT ----- Mild CAD. Nonobstructive CAD.  Would recommend rosuvastatin 20 mg daily to try to get LDL to 70.

## 2023-04-05 ENCOUNTER — Other Ambulatory Visit: Payer: Self-pay | Admitting: Internal Medicine

## 2023-04-05 DIAGNOSIS — R0789 Other chest pain: Secondary | ICD-10-CM

## 2023-04-22 ENCOUNTER — Other Ambulatory Visit: Payer: Self-pay | Admitting: Physical Medicine & Rehabilitation

## 2023-04-24 NOTE — Telephone Encounter (Signed)
Patient notified

## 2023-05-24 ENCOUNTER — Other Ambulatory Visit: Payer: Self-pay | Admitting: Physical Medicine & Rehabilitation

## 2023-06-07 ENCOUNTER — Encounter: Payer: Self-pay | Admitting: Physician Assistant

## 2023-06-07 ENCOUNTER — Ambulatory Visit: Payer: BC Managed Care – PPO | Admitting: Physician Assistant

## 2023-06-07 VITALS — BP 96/50 | HR 80 | Ht 65.0 in | Wt 229.5 lb

## 2023-06-07 DIAGNOSIS — K5909 Other constipation: Secondary | ICD-10-CM

## 2023-06-07 MED ORDER — LINACLOTIDE 290 MCG PO CAPS: 290.0000 ug | ORAL_CAPSULE | Freq: Every day | ORAL | 2 refills | Status: DC

## 2023-06-07 NOTE — Patient Instructions (Signed)
We have sent the following medications to your pharmacy for you to pick up at your convenience: Linzess  You are schedule for a follow up visit on 09/13/23 at 8:30  If your blood pressure at your visit was 140/90 or greater, please contact your primary care physician to follow up on this.  _______________________________________________________  If you are age 45 or older, your body mass index should be between 23-30. Your Body mass index is 38.19 kg/m. If this is out of the aforementioned range listed, please consider follow up with your Primary Care Provider.  If you are age 9 or younger, your body mass index should be between 19-25. Your Body mass index is 38.19 kg/m. If this is out of the aformentioned range listed, please consider follow up with your Primary Care Provider.   ________________________________________________________  The [facility] providers would like to encourage you to use [facility] to communicate with providers for non-urgent requests or questions.  Due to long hold times on the telephone, sending your provider a message by [facility] may be a faster and more efficient way to get a response.  Please allow 48 business hours for a response.  Please remember that this is for non-urgent requests.  _______________________________________________________  Thank you for entrusting me with your care and choosing [facility].  Hyacinth Meeker PA-C

## 2023-06-07 NOTE — Progress Notes (Signed)
Chief Complaint: Follow up Change in bowel habits  HPI:    Kathy Conway is a 47 year old female with a past medical history as listed below including fibromyalgia, degenerative disc disease and IBS, assigned to Dr. Tomasa Rand at last visit, who returns to clinic today for follow-up of her change in bowel habits.    11/16/2021 CTAP without contrast showed colonic diverticulosis.  Also prior hysterectomy.    07/13/2022 TSH and T4 normal, hemoglobin A1c normal.    11/13/2022 BMP normal.    11/24/2022 patient had an office visit with her PCP and discussed several months of problems with constipation with a bowel movement once every 3 to 4 days.  Felt like she incompletely evacuated and often used her hands to squeeze additional stools out.  Tried over-the-counter product such as Metamucil, probiotic, Benefiber and Dulcolax without improvement.  Increase fiber in her diet and drink plenty of water.  At that time she was told to start MiraLAX and was referred to [place].    12/28/2022 patient presented to clinic and described that instead of her chronic constipation she had more diarrhea after the flu and using Mucinex.  That time discussed she had a history of IBS-D years ago and now it was more constipation other than her recent episode of diarrhea with the flu.  We ordered a two-view x-ray and started her on Linzess 145 mcg daily.  Also given Dicyclomine 10 mg twice daily.  Discussed that we could possibly move up her colonoscopy, she is not due until October but if she continues with symptoms and would go ahead now.    12/28/2022 abdominal x-ray two-view showed no cause for symptoms.    Today, the patient tells me initially when she started the Linzess 145 mcg daily she was having diarrhea at least once a day but did feel emptied, then after the first 2 weeks she was told she need to drastically change her diet and lose some weight so she is started eating plant-based foods and more fruit and veggies and really  watching her salt and sugar intake and has stopped caffeine usage and now she is back to constipation tells me sometimes she can go 3 to 4 days without a bowel movement.  She has tried some suppositories which eventually will help, but it does not seem like the Linzess is working anymore.  Associated symptoms include bloating and a generalized abdominal discomfort at times.  She has lost about 20 pounds.    Denies fever, chills, blood in her stool, nausea, vomiting or symptoms that awaken her from sleep.  Past Medical History:  Diagnosis Date   Arthritis    Asthma 10/1993   Fibromyalgia    H/O degenerative disc disease    IBS (irritable bowel syndrome) 10/2003   Migraine 10/1996   Renal disorder    Sciatica    Vertigo 10/2009    Past Surgical History:  Procedure Laterality Date   ABDOMINAL HYSTERECTOMY     has bilateral ovaries   CHOLECYSTECTOMY     TUBAL LIGATION      Current Outpatient Medications  Medication Sig Dispense Refill   amitriptyline (ELAVIL) 10 MG tablet TAKE 1 TABLET BY MOUTH EVERYDAY AT BEDTIME 90 tablet 1   dicyclomine (BENTYL) 10 MG capsule TAKE 1 CAPSULE BY MOUTH 2 TIMES DAILY. 180 capsule 1   linaclotide (LINZESS) 145 MCG CAPS capsule Medication Samples have been provided to the patient.  Drug name: Linzess        Strength:  Qty: 8  LOT: [id number]  Exp.Date: 04/2023  Dosing instructions: Take 1 capsule daily before breakfast   The patient has been instructed regarding the correct time, dose, and frequency of taking this medication, including desired effects and most common side effects.   Brooke E Harp 3:02 PM 12/28/2022 90 capsule 3   losartan (COZAAR) 50 MG tablet Take 1 tablet (50 mg total) by mouth daily. 90 tablet 3   meloxicam (MOBIC) 15 MG tablet Take 1 tablet (15 mg total) by mouth daily. 15 tablet 0   metoprolol tartrate (LOPRESSOR) 100 MG tablet Take one tablet by mouth 2 hours prior to CT scan 1 tablet 0   omeprazole (PRILOSEC) 20 MG  capsule TAKE 1 CAPSULE BY MOUTH EVERY DAY 90 capsule 0   ondansetron (ZOFRAN) 4 MG tablet Take 1 tablet (4 mg total) by mouth every 8 (eight) hours as needed for nausea or vomiting. 10 tablet 0   predniSONE (DELTASONE) 50 MG tablet Take one tablet by mouth 13 hours, 7 hours and one hour prior to test 3 tablet 0   rosuvastatin (CRESTOR) 20 MG tablet Take 1 tablet (20 mg total) by mouth daily. 90 tablet 3   SUMAtriptan (IMITREX) 100 MG tablet Take 1 tablet (100 mg total) by mouth as needed for migraine. May repeat in 2 hours if headache persists or recurs. Max dose 2 pills in 24 hours 12 tablet 6   tiZANidine (ZANAFLEX) 4 MG tablet TAKE 1 TABLET BY MOUTH EVERY DAY 90 tablet 1   No current facility-administered medications for this visit.    Allergies as of 06/07/2023 - Review Complete 04/02/2023  Allergen Reaction Noted   Codeine Shortness Of Breath 10/17/2011   Gabapentin  01/17/2022   Iodine  07/17/2016   Latex Itching, Swelling, and Rash 10/17/2011    Family History  Problem Relation Age of Onset   Brain cancer Mother    Cancer Father        hip   Cancer Sister        HPV   Heart failure Maternal Grandfather    Diabetes Paternal Aunt    Esophageal cancer Neg Hx     Social History   Socioeconomic History   Marital status: Married    Spouse name: Not on file   Number of children: 2   Years of education: Not on file   Highest education level: Not on file  Occupational History   Occupation: shipment  Tobacco Use   Smoking status: Former    Current packs/day: 0.50    Types: Cigarettes    Passive exposure: Current   Smokeless tobacco: Never  Vaping Use   Vaping status: Never Used  Substance and Sexual Activity   Alcohol use: Yes    Alcohol/week: 2.0 standard drinks of alcohol    Types: 2 Shots of liquor per week    Comment: once a month   Drug use: No   Sexual activity: Yes    Partners: Male    Birth control/protection: Surgical    Comment: hysterectomy  Other  Topics Concern   Not on file  Social History Narrative   Right handed   Caffeine intake 5 cups   Social Determinants of Health   Financial Resource Strain: Not on file  Food Insecurity: No Food Insecurity (03/08/2023)   Hunger Vital Sign    Worried About Running Out of Food in the Last Year: Never true    Ran Out of Food in the Last Year: Never true  Transportation Needs: No Transportation Needs (03/08/2023)   PRAPARE - Administrator, Civil Service (Medical): No    Lack of Transportation (Non-Medical): No  Physical Activity: Not on file  Stress: Not on file  Social Connections: Not on file  Intimate Partner Violence: Not on file    Review of Systems:    Constitutional: No weight loss, fever or chills Cardiovascular: No chest pain  Respiratory: No SOB Gastrointestinal: See HPI and otherwise negative   Physical Exam:  Vital signs: BP (!) 96/50 (BP Location: Left Arm, Patient Position: Sitting, Cuff Size: Large)   Pulse 80   Ht 5\' 5"  (1.651 m) Comment: height measured without shoes  Wt 229 lb 8 oz (104.1 kg)   BMI 38.19 kg/m    Constitutional:   Pleasant overweight Caucasian female appears to be in NAD, Well developed, Well nourished, alert and cooperative Respiratory: Respirations even and unlabored. Lungs clear to auscultation bilaterally.   No wheezes, crackles, or rhonchi.  Cardiovascular: Normal S1, S2. No MRG. Regular rate and rhythm. No peripheral edema, cyanosis or pallor.  Gastrointestinal:  Soft, nondistended, nontender. No rebound or guarding. decreased bowel sounds all 4 quadrants.  No appreciable masses or hepatomegaly. Rectal:  Not performed.  Psychiatric: Demonstrates good judgement and reason without abnormal affect or behaviors.  RELEVANT LABS AND IMAGING: CBC    Component Value Date/Time   WBC 11.0 (H) 03/05/2023 2055   RBC 4.19 03/05/2023 2055   HGB 13.4 03/05/2023 2055   HCT 38.5 03/05/2023 2055   PLT 187 03/05/2023 2055   MCV 91.9  03/05/2023 2055   MCH 32.0 03/05/2023 2055   MCHC 34.8 03/05/2023 2055   RDW 12.6 03/05/2023 2055   LYMPHSABS 2.3 03/05/2023 2055   MONOABS 0.5 03/05/2023 2055   EOSABS 0.3 03/05/2023 2055   BASOSABS 0.0 03/05/2023 2055    CMP     Component Value Date/Time   NA 139 03/05/2023 2055   NA 140 01/31/2023 0725   K 3.7 03/05/2023 2055   CL 104 03/05/2023 2055   CO2 24 03/05/2023 2055   GLUCOSE 132 (H) 03/05/2023 2055   BUN 12 03/05/2023 2055   BUN 16 01/31/2023 0725   CREATININE 0.66 03/05/2023 2055   CALCIUM 9.1 03/05/2023 2055   PROT 6.5 01/31/2023 0725   ALBUMIN 4.5 01/31/2023 0725   AST 16 01/31/2023 0725   ALT 18 01/31/2023 0725   ALKPHOS 68 01/31/2023 0725   BILITOT 0.4 01/31/2023 0725   GFRNONAA >60 03/05/2023 2055   GFRAA >60 05/01/2020 0022    Assessment: 1.  Chronic constipation/IBS: Initially Linzess 145 mcg daily was working but she has had a drastic diet change and attempts to lower her blood pressure help her cholesterol lose weight, she is returned to constipation  Plan: 1.  Increase Linzess to 290 mcg daily.  Prescribed #30 with 5 refills. 2.  Did discuss she can take 2 of her 145 mcg tabs for now 3.  Patient will let me know how this working for her over the next month or so, if no real change then we will need to try different product. 4.  Patient to follow in clinic with me in 2 to 3 months.  She will be due for a colonoscopy in October.  Hyacinth Meeker, PA-C [facility] 06/07/2023, 8:25 AM  Cc: Marcine Matar, MD

## 2023-06-10 NOTE — Progress Notes (Signed)
Agree with the assessment and plan as outlined by Jennifer Lemmon, PA-C. ? ?Scott E. Cunningham, MD ? ?

## 2023-07-03 ENCOUNTER — Ambulatory Visit: Payer: BC Managed Care – PPO | Attending: Interventional Cardiology

## 2023-07-03 DIAGNOSIS — E785 Hyperlipidemia, unspecified: Secondary | ICD-10-CM

## 2023-07-04 LAB — HEPATIC FUNCTION PANEL
ALT: 16 IU/L (ref 0–32)
AST: 13 IU/L (ref 0–40)
Albumin: 4.5 g/dL (ref 3.9–4.9)
Alkaline Phosphatase: 67 IU/L (ref 44–121)
Bilirubin Total: 0.6 mg/dL (ref 0.0–1.2)
Bilirubin, Direct: 0.2 mg/dL (ref 0.00–0.40)
Total Protein: 6.7 g/dL (ref 6.0–8.5)

## 2023-07-04 LAB — LIPID PANEL
Chol/HDL Ratio: 2.4 ratio (ref 0.0–4.4)
Cholesterol, Total: 95 mg/dL — ABNORMAL LOW (ref 100–199)
HDL: 40 mg/dL (ref >39)
LDL Chol Calc (NIH): 37 mg/dL (ref 0–99)
Triglycerides: 89 mg/dL (ref 0–149)
VLDL Cholesterol Cal: 18 mg/dL (ref 5–40)

## 2023-07-06 ENCOUNTER — Other Ambulatory Visit: Payer: Self-pay | Admitting: Internal Medicine

## 2023-07-06 DIAGNOSIS — Z1231 Encounter for screening mammogram for malignant neoplasm of breast: Secondary | ICD-10-CM

## 2023-07-28 ENCOUNTER — Other Ambulatory Visit: Payer: Self-pay | Admitting: Internal Medicine

## 2023-07-28 DIAGNOSIS — R0789 Other chest pain: Secondary | ICD-10-CM

## 2023-08-12 ENCOUNTER — Other Ambulatory Visit: Payer: Self-pay | Admitting: Internal Medicine

## 2023-08-12 DIAGNOSIS — R0789 Other chest pain: Secondary | ICD-10-CM

## 2023-09-13 ENCOUNTER — Ambulatory Visit: Payer: BC Managed Care – PPO | Admitting: Physician Assistant

## 2023-09-13 ENCOUNTER — Encounter: Payer: Self-pay | Admitting: Physician Assistant

## 2023-09-13 VITALS — BP 128/78 | HR 66 | Ht 65.0 in | Wt 217.0 lb

## 2023-09-13 DIAGNOSIS — K5900 Constipation, unspecified: Secondary | ICD-10-CM

## 2023-09-13 DIAGNOSIS — Z1211 Encounter for screening for malignant neoplasm of colon: Secondary | ICD-10-CM

## 2023-09-13 NOTE — Patient Instructions (Signed)
You have been scheduled for a colonoscopy. Please follow written instructions given to you at your visit today.   Please pick up your prep supplies at the pharmacy within the next 1-3 days.  If you use inhalers (even only as needed), please bring them with you on the day of your procedure.  DO NOT TAKE 7 DAYS PRIOR TO TEST- Trulicity (dulaglutide) Ozempic, Wegovy (semaglutide) Mounjaro (tirzepatide) Bydureon Bcise (exanatide extended release)  DO NOT TAKE 1 DAY PRIOR TO YOUR TEST Rybelsus (semaglutide) Adlyxin (lixisenatide) Victoza (liraglutide) Byetta (exanatide)  _______________________________________________________  If your blood pressure at your visit was 140/90 or greater, please contact your primary care physician to follow up on this.  _______________________________________________________  If you are age 45 or older, your body mass index should be between 23-30. Your Body mass index is 36.11 kg/m. If this is out of the aforementioned range listed, please consider follow up with your Primary Care Provider.  If you are age 45 or younger, your body mass index should be between 19-25. Your Body mass index is 36.11 kg/m. If this is out of the aformentioned range listed, please consider follow up with your Primary Care Provider.   ________________________________________________________  The [facility] providers would like to encourage you to use [facility] to communicate with providers for non-urgent requests or questions.  Due to long hold times on the telephone, sending your provider a message by [facility] may be a faster and more efficient way to get a response.  Please allow 48 business hours for a response.  Please remember that this is for non-urgent requests.   Hyacinth Meeker, PA-C

## 2023-09-13 NOTE — Progress Notes (Signed)
Chief Complaint: Follow up Constipation  HPI:    Kathy Conway is a  47 y/o Caucasian female with a past medical history as listed below including fibromyalgia, degenerative disc disease and IBS, assigned to Dr. Tomasa Rand, who returns to clinic today for follow-up of her constipation.    11/16/2021 CTAP without contrast showed colonic diverticulosis.  Also prior hysterectomy.    07/13/2022 TSH and T4 normal, hemoglobin A1c normal.    11/13/2022 BMP normal.    11/24/2022 patient had an office visit with her PCP and discussed several months of problems with constipation with a bowel movement once every 3 to 4 days.  Felt like she incompletely evacuated and often used her hands to squeeze additional stools out.  Tried over-the-counter product such as Metamucil, probiotic, Benefiber and Dulcolax without improvement.  Increase fiber in her diet and drink plenty of water.  At that time she was told to start MiraLAX and was referred to [place].    12/28/2022 patient presented to clinic and described that instead of her chronic constipation she had more diarrhea after the flu and using Mucinex.  That time discussed she had a history of IBS-D years ago and now it was more constipation other than her recent episode of diarrhea with the flu.  We ordered a two-view x-ray and started her on Linzess 145 mcg daily.  Also given Dicyclomine 10 mg twice daily.  Discussed that we could possibly move up her colonoscopy, she is not due until October but if she continues with symptoms and would go ahead now.    12/28/2022 abdominal x-ray two-view showed no cause for symptoms.    06/07/2023 patient seen in clinic and at that time described that initially when she started Linzess under 45 mcg daily she was having diarrhea at least once a day but did not feel emptied, she drastically changed her diet but was back to a bowel movement every 3 to 4 days.  At that time increase Linzess to 290 mcg daily.  Discussed that she would be due for a  colonoscopy in October.    Today, the patient tells me that her constipation is actually completely relieved.  She is continuing to lose some weight by adding a folic acid supplement to a limited diet throughout the day and feels good about all of this.  She has actually not needed her Dicyclomine or Linzess at all.  Instead she is drinking a cup of coffee or 2 in the morning which seems to move things through.  Typically she has at least 1 soft solid bowel movement a day.  She does continue on Omeprazole for reflux.    Denies fever, chills or blood in her stool.  Past Medical History:  Diagnosis Date   Arthritis    Asthma 10/1993   CAD (coronary artery disease)    Fatty liver    Fibromyalgia    H/O degenerative disc disease    IBS (irritable bowel syndrome) 10/2003   Migraine 10/1996   Renal disorder    Sciatica    Vertigo 10/2009    Past Surgical History:  Procedure Laterality Date   ABDOMINAL HYSTERECTOMY     has bilateral ovaries   CHOLECYSTECTOMY     TUBAL LIGATION      Current Outpatient Medications  Medication Sig Dispense Refill   losartan (COZAAR) 50 MG tablet Take 1 tablet (50 mg total) by mouth daily. 90 tablet 3   omeprazole (PRILOSEC) 20 MG capsule TAKE 1 CAPSULE BY MOUTH  EVERY DAY 30 capsule 0   rosuvastatin (CRESTOR) 20 MG tablet Take 1 tablet (20 mg total) by mouth daily. 90 tablet 3   tiZANidine (ZANAFLEX) 4 MG tablet TAKE 1 TABLET BY MOUTH EVERY DAY 90 tablet 1   dicyclomine (BENTYL) 10 MG capsule TAKE 1 CAPSULE BY MOUTH 2 TIMES DAILY. (Patient not taking: Reported on 09/13/2023) 180 capsule 1   linaclotide (LINZESS) 290 MCG CAPS capsule Take 1 capsule (290 mcg total) by mouth daily before breakfast. (Patient not taking: Reported on 09/13/2023) 30 capsule 2   metoprolol tartrate (LOPRESSOR) 100 MG tablet Take one tablet by mouth 2 hours prior to CT scan (Patient not taking: Reported on 09/13/2023) 1 tablet 0   SUMAtriptan (IMITREX) 100 MG tablet Take 1 tablet (100  mg total) by mouth as needed for migraine. May repeat in 2 hours if headache persists or recurs. Max dose 2 pills in 24 hours (Patient not taking: Reported on 09/13/2023) 12 tablet 6   No current facility-administered medications for this visit.    Allergies as of 09/13/2023 - Review Complete 09/13/2023  Allergen Reaction Noted   Codeine Shortness Of Breath 10/17/2011   Gabapentin  01/17/2022   Iodine  07/17/2016   Latex Itching, Swelling, and Rash 10/17/2011    Family History  Problem Relation Age of Onset   Brain cancer Mother    Cancer Father        hip   Cancer Sister        HPV   Heart failure Maternal Grandfather    Diabetes Paternal Aunt    Esophageal cancer Neg Hx     Social History   Socioeconomic History   Marital status: Married    Spouse name: Not on file   Number of children: 2   Years of education: Not on file   Highest education level: Not on file  Occupational History   Occupation: shipment  Tobacco Use   Smoking status: Former    Current packs/day: 0.50    Types: Cigarettes    Passive exposure: Current   Smokeless tobacco: Never  Vaping Use   Vaping status: Never Used  Substance and Sexual Activity   Alcohol use: Yes    Alcohol/week: 2.0 standard drinks of alcohol    Types: 2 Shots of liquor per week    Comment: once a month   Drug use: No   Sexual activity: Yes    Partners: Male    Birth control/protection: Surgical    Comment: hysterectomy  Other Topics Concern   Not on file  Social History Narrative   Right handed   Caffeine intake 5 cups   Social Determinants of Health   Financial Resource Strain: Not on file  Food Insecurity: No Food Insecurity (03/08/2023)   Hunger Vital Sign    Worried About Running Out of Food in the Last Year: Never true    Ran Out of Food in the Last Year: Never true  Transportation Needs: No Transportation Needs (03/08/2023)   PRAPARE - Administrator, Civil Service (Medical): No    Lack of  Transportation (Non-Medical): No  Physical Activity: Not on file  Stress: Not on file  Social Connections: Not on file  Intimate Partner Violence: Not on file    Review of Systems:    Constitutional: No weight loss, fever or chills Cardiovascular: No chest pain Respiratory: No SOB  Gastrointestinal: See HPI and otherwise negative   Physical Exam:  Vital signs: BP 128/78 (BP Location:  Left Arm, Patient Position: Sitting)   Pulse 66   Ht 5\' 5"  (1.651 m)   Wt 217 lb (98.4 kg)   SpO2 96%   BMI 36.11 kg/m    Constitutional:   Pleasant overweight Caucasian female appears to be in NAD, Well developed, Well nourished, alert and cooperative Respiratory: Respirations even and unlabored. Lungs clear to auscultation bilaterally.   No wheezes, crackles, or rhonchi.  Cardiovascular: Normal S1, S2. No MRG. Regular rate and rhythm. No peripheral edema, cyanosis or pallor.  Gastrointestinal:  Soft, nondistended, nontender. No rebound or guarding. Normal bowel sounds. No appreciable masses or hepatomegaly. Rectal:  Not performed.  Psychiatric: Oriented to person, place and time. Demonstrates good judgement and reason without abnormal affect or behaviors.  RELEVANT LABS AND IMAGING: CBC    Component Value Date/Time   WBC 11.0 (H) 03/05/2023 2055   RBC 4.19 03/05/2023 2055   HGB 13.4 03/05/2023 2055   HCT 38.5 03/05/2023 2055   PLT 187 03/05/2023 2055   MCV 91.9 03/05/2023 2055   MCH 32.0 03/05/2023 2055   MCHC 34.8 03/05/2023 2055   RDW 12.6 03/05/2023 2055   LYMPHSABS 2.3 03/05/2023 2055   MONOABS 0.5 03/05/2023 2055   EOSABS 0.3 03/05/2023 2055   BASOSABS 0.0 03/05/2023 2055    CMP     Component Value Date/Time   NA 139 03/05/2023 2055   NA 140 01/31/2023 0725   K 3.7 03/05/2023 2055   CL 104 03/05/2023 2055   CO2 24 03/05/2023 2055   GLUCOSE 132 (H) 03/05/2023 2055   BUN 12 03/05/2023 2055   BUN 16 01/31/2023 0725   CREATININE 0.66 03/05/2023 2055   CALCIUM 9.1 03/05/2023  2055   PROT 6.7 07/03/2023 0729   ALBUMIN 4.5 07/03/2023 0729   AST 13 07/03/2023 0729   ALT 16 07/03/2023 0729   ALKPHOS 67 07/03/2023 0729   BILITOT 0.6 07/03/2023 0729   GFRNONAA >60 03/05/2023 2055   GFRAA >60 05/01/2020 0022    Assessment: 1.  Screening for colorectal cancer: Patient is now 83 and due for screening colonoscopy 2.  Constipation: Resolved after an increase in exercise, water and a change in diet, no longer requiring Linzess  Plan: 1.  Scheduled patient for screening colonoscopy with Dr. Tomasa Rand in the [facility].  Did provide the patient a detailed list of risks for the procedure and she agrees to proceed. Patient is appropriate for endoscopic procedure(s) in the ambulatory ([facility]) setting.  2.  Patient requested a MiraLAX bowel prep. 3.  Patient to continue her coffee and diet/exercise which have resulted in improved bowel movements 4.  Patient to follow in clinic with [place] as needed after time of colonoscopy.  Hyacinth Meeker, PA-C [facility] 09/13/2023, 8:30 AM  Cc: Marcine Matar, MD

## 2023-09-17 NOTE — Progress Notes (Signed)
Agree with the assessment and plan as outlined by Jennifer Lemmon, PA-C. ? ?Kathy Conway E. Audrinna Sherman, MD ? ?

## 2023-10-05 ENCOUNTER — Other Ambulatory Visit: Payer: Self-pay | Admitting: Medical Genetics

## 2023-10-12 ENCOUNTER — Encounter (HOSPITAL_COMMUNITY): Payer: Self-pay

## 2023-10-12 ENCOUNTER — Ambulatory Visit: Payer: Self-pay

## 2023-10-12 ENCOUNTER — Ambulatory Visit (HOSPITAL_COMMUNITY)
Admission: EM | Admit: 2023-10-12 | Discharge: 2023-10-12 | Disposition: A | Discharge status: Home / Self Care | Payer: BC Managed Care – PPO | Attending: Family Medicine | Admitting: Family Medicine

## 2023-10-12 DIAGNOSIS — Z8619 Personal history of other infectious and parasitic diseases: Secondary | ICD-10-CM | POA: Diagnosis not present

## 2023-10-12 DIAGNOSIS — J0140 Acute pansinusitis, unspecified: Secondary | ICD-10-CM | POA: Diagnosis not present

## 2023-10-12 MED ORDER — AMOXICILLIN-POT CLAVULANATE 875-125 MG PO TABS: 1.0000 | ORAL_TABLET | Freq: Two times a day (BID) | ORAL | 0 refills | Status: AC

## 2023-10-12 MED ORDER — PROMETHAZINE-DM 6.25-15 MG/5ML PO SYRP: 5.0000 mL | ORAL_SOLUTION | Freq: Three times a day (TID) | ORAL | 0 refills | Status: DC | PRN

## 2023-10-12 NOTE — ED Triage Notes (Signed)
 Pt c/o cough, congestion, headaches, body aches, loss of taste/smell, and fatigue since 12/17. States taking multiple OTC meds with no relief.

## 2023-10-12 NOTE — Telephone Encounter (Signed)
 Chief Complaint: Cough  Symptoms: Productive Cough, body ache, headache, earache, congestion, nausea, fatigue Frequency: Constant Pertinent Negatives: Patient denies fever, vomiting, chest pain Disposition: [] ED /[x] Urgent Care (no appt availability in office) / [] Appointment(In office/virtual)/ []  [facility]/ [] Home Care/ [] Refused Recommended Disposition /[] [facility]/ []  Follow-up with PCP Additional Notes: Patient states she has had a productive cough with dark green sputum since the week before Christmas. Patient reports that she has been taking OTC medications to treat symptoms without much improvement. Patient stated she had a fever the first week of symptoms but it has gone back to normal now. Patient states she was sent home from work today due to the symptoms. Care advice was given and patient has already scheduled an appointment in office on Monday. Patient stated she will go to urgent care today. Patient will call to cancel office appointment after being seen at urgent care.  Reason for Disposition  [1] Continuous (nonstop) coughing interferes with work or school AND [2] no improvement using cough treatment per Care Advice  Answer Assessment - Initial Assessment Questions 1. ONSET: When did the cough begin?      Week before Christmas 2. SEVERITY: How bad is the cough today?      Severe 3. SPUTUM: Describe the color of your sputum (none, dry cough; clear, white, yellow, green)     Dark Green 4. HEMOPTYSIS: Are you coughing up any blood? If so ask: How much? (flecks, streaks, tablespoons, etc.)     No  5. DIFFICULTY BREATHING: Are you having difficulty breathing? If Yes, ask: How bad is it? (e.g., mild, moderate, severe)    - MILD: No SOB at rest, mild SOB with walking, speaks normally in sentences, can lie down, no retractions, pulse < 100.    - MODERATE: SOB at rest, SOB with minimal exertion and prefers to sit, cannot lie down flat, speaks in  phrases, mild retractions, audible wheezing, pulse 100-120.    - SEVERE: Very SOB at rest, speaks in single words, struggling to breathe, sitting hunched forward, retractions, pulse > 120      Mild to Moderate  6. FEVER: Do you have a fever? If Yes, ask: What is your temperature, how was it measured, and when did it start?     No  7. CARDIAC HISTORY: Do you have any history of heart disease? (e.g., heart attack, congestive heart failure)      Aorta problems  8. LUNG HISTORY: Do you have any history of lung disease?  (e.g., pulmonary embolus, asthma, emphysema)     Asthma 9. PE RISK FACTORS: Do you have a history of blood clots? (or: recent major surgery, recent prolonged travel, bedridden)     No 10. OTHER SYMPTOMS: Do you have any other symptoms? (e.g., runny nose, wheezing, chest pain)       Earache, congestion, body aches, fatigue, headache, runny nose, nausea         12. TRAVEL: Have you traveled out of the country in the last month? (e.g., travel history, exposures)       No  Protocols used: Cough - Acute Productive-A-AH

## 2023-10-12 NOTE — ED Provider Notes (Signed)
 [facility]    CSN: [id number] Arrival date & time: 10/12/23  9087      History   Chief Complaint Chief Complaint  Patient presents with   Cough    HPI Kathy Conway is a 48 y.o. female.    Cough Cough , headache, fatigue, right side ear pain, purulent mucus nasal discharge and productive cough x 09/25/2023. Multiple cough and cold medication, airborne, and using a humidifier. Symptoms have progressively worsening over the last week. 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   Class 3 severe obesity due to excess calories without serious comorbidity with body mass index (BMI) of 40.0 to 44.9 in adult (HCC) 08/06/2017   History of gestational diabetes 04/17/2017   Bilateral hearing loss 03/24/2017   Acute pain of right foot 03/24/2017   Insomnia 03/05/2017   Plantar fasciitis, left 10/23/2016   Chronic bilateral low back pain with bilateral sciatica 09/08/2016   IBS (irritable bowel syndrome) 05/23/2016   Asthma    Boils 03/10/2016   Anxiety state 03/10/2016   H/O degenerative disc disease    Fibromyalgia    Migraine 10/09/1996    Past Surgical History:  Procedure Laterality Date   ABDOMINAL HYSTERECTOMY     has bilateral ovaries   CHOLECYSTECTOMY     TUBAL LIGATION      OB History     Gravida  3   Para  2   Term      Preterm      AB  1   Living  2      SAB      IAB  1   Ectopic      Multiple      Live Births                Home Medications    Prior to Admission medications   Medication Sig Start Date End Date Taking? Authorizing Provider  amoxicillin -clavulanate (AUGMENTIN ) 875-125 MG tablet Take 1 tablet by mouth 2 (two) times daily for 10 days. 10/12/23 10/22/23 Yes Arloa Suzen RAMAN, NP  promethazine -dextromethorphan (PROMETHAZINE -DM) 6.25-15 MG/5ML syrup Take 5 mLs by mouth 3 (three) times daily as needed for cough. 10/12/23  Yes Arloa Suzen RAMAN, NP  dicyclomine  (BENTYL ) 10 MG capsule TAKE 1 CAPSULE BY MOUTH 2 TIMES DAILY. Patient not taking: Reported on 09/13/2023 03/26/23   Beather Delon Gibson, PA  linaclotide  (LINZESS ) 290 MCG CAPS capsule Take 1 capsule (290 mcg total) by mouth daily before breakfast. Patient not taking: Reported on 09/13/2023 06/07/23   Beather Delon Gibson, PA  losartan  (COZAAR ) 50 MG tablet Take 1 tablet (50 mg total) by mouth daily. 01/22/23   Dann Candyce RAMAN, MD  metoprolol  tartrate (LOPRESSOR ) 100 MG tablet Take one tablet by mouth 2 hours prior to CT scan Patient not taking: Reported on 09/13/2023 02/16/23   Dann Candyce RAMAN, MD  omeprazole  (PRILOSEC) 20 MG capsule TAKE  1 CAPSULE BY MOUTH EVERY DAY 07/30/23   Vicci Barnie NOVAK, MD  rosuvastatin  (CRESTOR ) 20 MG tablet Take 1 tablet (20 mg total) by mouth daily. 04/04/23   Dann Candyce RAMAN, MD  SUMAtriptan  (IMITREX ) 100 MG tablet Take 1 tablet (100 mg total) by mouth as needed for migraine. May repeat in 2 hours if headache persists or recurs. Max dose 2 pills in 24 hours Patient not taking: Reported on 09/13/2023 03/29/22   Rush Nest, MD  tiZANidine  (ZANAFLEX ) 4 MG tablet TAKE 1 TABLET BY MOUTH EVERY DAY 05/25/23   Urbano Albright, MD  DULoxetine  (CYMBALTA ) 30 MG capsule Take 1 capsule (30 mg total) by mouth daily. Patient not taking: Reported on 05/23/2018 12/06/17 10/21/20  Vicci Barnie NOVAK, MD    Family History Family History  Problem Relation Age of Onset   Brain cancer Mother    Cancer Father         hip   Cancer Sister        HPV   Heart failure Maternal Grandfather    Diabetes Paternal Aunt    Esophageal cancer Neg Hx     Social History Social History   Tobacco Use   Smoking status: Former    Current packs/day: 0.50    Types: Cigarettes    Passive exposure: Current   Smokeless tobacco: Never  Vaping Use   Vaping status: Never Used  Substance Use Topics   Alcohol use: Yes    Alcohol/week: 2.0 standard drinks of alcohol    Types: 2 Shots of liquor per week    Comment: once a month   Drug use: No     Allergies   Codeine, Gabapentin , Iodine, and Latex   Review of Systems Review of Systems  Respiratory:  Positive for cough.      Physical Exam Triage Vital Signs ED Triage Vitals [10/12/23 0952]  Encounter Vitals Group     BP 129/83     Systolic BP Percentile      Diastolic BP Percentile      Pulse Rate 94     Resp 18     Temp 98.1 F (36.7 C)     Temp Source Oral     SpO2 98 %     Weight      Height      Head Circumference      Peak Flow      Pain Score 7     Pain Loc      Pain Education      Exclude from Growth Chart    No data found.  Updated Vital Signs BP 129/83 (BP Location: Left Arm)   Pulse 94   Temp 98.1 F (36.7 C) (Oral)   Resp 18   SpO2 98%   Visual Acuity Right Eye Distance:   Left Eye Distance:   Bilateral Distance:    Right Eye Near:   Left Eye Near:    Bilateral Near:     Physical Exam Vitals reviewed.  Constitutional:      Appearance: Normal appearance. She is normal weight. She is ill-appearing.  HENT:     Head: Normocephalic and atraumatic.     Right Ear: Tympanic membrane, ear canal and external ear normal. There is no impacted cerumen.     Left Ear: Tympanic membrane, ear canal and external ear normal. There is no impacted cerumen.     Nose: Mucosal edema, congestion and rhinorrhea present. Rhinorrhea is purulent.     Right Sinus: Maxillary  sinus tenderness and frontal sinus tenderness present.     Left  Sinus: Maxillary sinus tenderness and frontal sinus tenderness present.  Cardiovascular:     Rate and Rhythm: Normal rate and regular rhythm.  Pulmonary:     Breath sounds: Normal breath sounds.  Neurological:     Mental Status: She is alert.      UC Treatments / Results  Labs (all labs ordered are listed, but only abnormal results are displayed) Labs Reviewed - No data to display  EKG   Radiology No results found.  Procedures Procedures (including critical care time)  Medications Ordered in UC Medications - No data to display  Initial Impression / Assessment and Plan / UC Course  I have reviewed the triage vital signs and the nursing notes.  Pertinent labs & imaging results that were available during my care of the patient were reviewed by me and considered in my medical decision making (see chart for details).    Acute non-recurrent sinusitis, uncomplicated,  empiric treatment with Augmentin  BID x 10 days. Promethazine  DM for cough. Continue OTC analgesics for headaches pain. Force fluids. Return as needed.   Final diagnoses:  Acute non-recurrent pansinusitis  Hx of viral illness   Discharge Instructions   None    ED Prescriptions     Medication Sig Dispense Auth. Provider   amoxicillin -clavulanate (AUGMENTIN ) 875-125 MG tablet Take 1 tablet by mouth 2 (two) times daily for 10 days. 20 tablet Arloa Suzen RAMAN, NP   promethazine -dextromethorphan (PROMETHAZINE -DM) 6.25-15 MG/5ML syrup Take 5 mLs by mouth 3 (three) times daily as needed for cough. 118 mL Arloa Suzen RAMAN, NP      PDMP not reviewed this encounter.   Arloa Suzen RAMAN, NP 10/13/23 [phone]

## 2023-10-12 NOTE — Telephone Encounter (Signed)
 noted

## 2023-10-13 ENCOUNTER — Other Ambulatory Visit: Payer: Self-pay | Admitting: Internal Medicine

## 2023-10-13 DIAGNOSIS — R0789 Other chest pain: Secondary | ICD-10-CM

## 2023-10-15 ENCOUNTER — Ambulatory Visit: Payer: BC Managed Care – PPO | Admitting: Nurse Practitioner

## 2023-10-26 ENCOUNTER — Ambulatory Visit (AMBULATORY_SURGERY_CENTER): Payer: BC Managed Care – PPO | Admitting: Gastroenterology

## 2023-10-26 ENCOUNTER — Encounter: Payer: Self-pay | Admitting: Gastroenterology

## 2023-10-26 VITALS — BP 140/62 | HR 57 | Temp 97.9°F | Resp 14 | Ht 65.0 in | Wt 217.0 lb

## 2023-10-26 DIAGNOSIS — K5730 Diverticulosis of large intestine without perforation or abscess without bleeding: Secondary | ICD-10-CM | POA: Diagnosis not present

## 2023-10-26 DIAGNOSIS — Z1211 Encounter for screening for malignant neoplasm of colon: Secondary | ICD-10-CM | POA: Diagnosis not present

## 2023-10-26 DIAGNOSIS — D123 Benign neoplasm of transverse colon: Secondary | ICD-10-CM | POA: Diagnosis not present

## 2023-10-26 MED ORDER — SODIUM CHLORIDE 0.9 % IV SOLN: 500.0000 mL | INTRAVENOUS | Status: DC

## 2023-10-26 NOTE — Progress Notes (Signed)
Pt's states no medical or surgical changes since previsit or office visit. 

## 2023-10-26 NOTE — Progress Notes (Signed)
Called to room to assist during endoscopic procedure.  Patient ID and intended procedure confirmed with present staff. Received instructions for my participation in the procedure from the performing physician.  

## 2023-10-26 NOTE — Progress Notes (Signed)
[facility] History and Physical   Primary Care Physician:  Marcine Matar, MD   Reason for Procedure:   Colon cancer screening  Plan:    Screening colonoscopy     HPI: Kathy Conway is a 48 y.o. female undergoing initial average risk screening colonoscopy.  She has no family history of colon cancer and no chronic GI symptoms other than occasional constipation.    Past Medical History:  Diagnosis Date   Arthritis    Asthma 10/1993   CAD (coronary artery disease)    Fatty liver    Fibromyalgia    H/O degenerative disc disease    IBS (irritable bowel syndrome) 10/2003   Migraine 10/1996   Renal disorder    Sciatica    Vertigo 10/2009    Past Surgical History:  Procedure Laterality Date   ABDOMINAL HYSTERECTOMY     has bilateral ovaries   CHOLECYSTECTOMY     TUBAL LIGATION      Prior to Admission medications   Medication Sig Start Date End Date Taking? Authorizing Provider  losartan (COZAAR) 50 MG tablet Take 1 tablet (50 mg total) by mouth daily. 01/22/23  Yes Corky Crafts, MD  rosuvastatin (CRESTOR) 20 MG tablet Take 1 tablet (20 mg total) by mouth daily. 04/04/23  Yes Corky Crafts, MD  linaclotide Karlene Einstein) 290 MCG CAPS capsule Take 1 capsule (290 mcg total) by mouth daily before breakfast. Patient not taking: Reported on 10/26/2023 06/07/23   Unk Lightning, PA  metoprolol tartrate (LOPRESSOR) 100 MG tablet Take one tablet by mouth 2 hours prior to CT scan Patient not taking: Reported on 10/26/2023 02/16/23   Corky Crafts, MD  omeprazole (PRILOSEC) 20 MG capsule Take 1 capsule (20 mg total) by mouth daily. Must have office visit for refills Patient not taking: Reported on 10/26/2023 10/14/23   Marcine Matar, MD  promethazine-dextromethorphan (PROMETHAZINE-DM) 6.25-15 MG/5ML syrup Take 5 mLs by mouth 3 (three) times daily as needed for cough. Patient not taking: Reported on 10/26/2023 10/12/23   Bing Neighbors, NP   SUMAtriptan (IMITREX) 100 MG tablet Take 1 tablet (100 mg total) by mouth as needed for migraine. May repeat in 2 hours if headache persists or recurs. Max dose 2 pills in 24 hours Patient not taking: Reported on 09/13/2023 03/29/22   Ocie Doyne, MD  tiZANidine (ZANAFLEX) 4 MG tablet TAKE 1 TABLET BY MOUTH EVERY DAY 05/25/23   Fanny Dance, MD  DULoxetine (CYMBALTA) 30 MG capsule Take 1 capsule (30 mg total) by mouth daily. Patient not taking: Reported on 05/23/2018 12/06/17 10/21/20  Marcine Matar, MD    Current Outpatient Medications  Medication Sig Dispense Refill   losartan (COZAAR) 50 MG tablet Take 1 tablet (50 mg total) by mouth daily. 90 tablet 3   rosuvastatin (CRESTOR) 20 MG tablet Take 1 tablet (20 mg total) by mouth daily. 90 tablet 3   linaclotide (LINZESS) 290 MCG CAPS capsule Take 1 capsule (290 mcg total) by mouth daily before breakfast. (Patient not taking: Reported on 10/26/2023) 30 capsule 2   metoprolol tartrate (LOPRESSOR) 100 MG tablet Take one tablet by mouth 2 hours prior to CT scan (Patient not taking: Reported on 10/26/2023) 1 tablet 0   omeprazole (PRILOSEC) 20 MG capsule Take 1 capsule (20 mg total) by mouth daily. Must have office visit for refills (Patient not taking: Reported on 10/26/2023) 30 capsule 0   promethazine-dextromethorphan (PROMETHAZINE-DM) 6.25-15 MG/5ML syrup Take 5 mLs by mouth 3 (three)  times daily as needed for cough. (Patient not taking: Reported on 10/26/2023) 118 mL 0   SUMAtriptan (IMITREX) 100 MG tablet Take 1 tablet (100 mg total) by mouth as needed for migraine. May repeat in 2 hours if headache persists or recurs. Max dose 2 pills in 24 hours (Patient not taking: Reported on 09/13/2023) 12 tablet 6   tiZANidine (ZANAFLEX) 4 MG tablet TAKE 1 TABLET BY MOUTH EVERY DAY 90 tablet 1   Current Facility-Administered Medications  Medication Dose Route Frequency Provider Last Rate Last Admin   0.9 %  sodium chloride infusion  500 mL Intravenous  Continuous Jenel Lucks, MD        Allergies as of 10/26/2023 - Review Complete 10/26/2023  Allergen Reaction Noted   Codeine Shortness Of Breath 10/17/2011   Gabapentin  01/17/2022   Iodine  07/17/2016   Latex Itching, Swelling, and Rash 10/17/2011    Family History  Problem Relation Age of Onset   Brain cancer Mother    Cancer Father        hip   Cancer Sister        HPV   Heart failure Maternal Grandfather    Diabetes Paternal Aunt    Esophageal cancer Neg Hx     Social History   Socioeconomic History   Marital status: Married    Spouse name: Not on file   Number of children: 2   Years of education: Not on file   Highest education level: Not on file  Occupational History   Occupation: shipment  Tobacco Use   Smoking status: Former    Current packs/day: 0.50    Types: Cigarettes    Passive exposure: Current   Smokeless tobacco: Never  Vaping Use   Vaping status: Never Used  Substance and Sexual Activity   Alcohol use: Not Currently    Alcohol/week: 2.0 standard drinks of alcohol    Types: 2 Shots of liquor per week    Comment: once a month   Drug use: No   Sexual activity: Yes    Partners: Male    Birth control/protection: Surgical    Comment: hysterectomy  Other Topics Concern   Not on file  Social History Narrative   Right handed   Caffeine intake 5 cups   Social Drivers of Health   Financial Resource Strain: Not on file  Food Insecurity: No Food Insecurity (03/08/2023)   Hunger Vital Sign    Worried About Running Out of Food in the Last Year: Never true    Ran Out of Food in the Last Year: Never true  Transportation Needs: No Transportation Needs (03/08/2023)   PRAPARE - Administrator, Civil Service (Medical): No    Lack of Transportation (Non-Medical): No  Physical Activity: Not on file  Stress: Not on file  Social Connections: Not on file  Intimate Partner Violence: Not on file    Review of Systems:  All other review  of systems negative except as mentioned in the HPI.  Physical Exam: Vital signs BP 112/78   Pulse 63   Temp 97.9 F (36.6 C)   Resp 10   Ht 5\' 5"  (1.651 m)   Wt 217 lb (98.4 kg)   SpO2 97%   BMI 36.11 kg/m   General:   Alert,  Well-developed, well-nourished, pleasant and cooperative in NAD Airway:  Mallampati 2 Lungs:  Clear throughout to auscultation.   Heart:  Regular rate and rhythm; no murmurs, clicks, rubs,  or  gallops. Abdomen:  Soft, nontender and nondistended. Normal bowel sounds.   Neuro/Psych:  Normal mood and affect. A and O x 3   Leone Putman E. Tomasa Rand, MD [facility]

## 2023-10-26 NOTE — Progress Notes (Signed)
Vss nad trans to pacu 

## 2023-10-26 NOTE — Patient Instructions (Signed)
Educational handout provided to patient related to Polyps and Diverticulosis  Resume previous diet  Continue present medications  Awaiting pathology results  Repeat colonoscopy in 3 years due to bowel prep being suboptimal   YOU HAD AN ENDOSCOPIC PROCEDURE TODAY AT THE [facility]:   Refer to the procedure report that was given to you for any specific questions about what was found during the examination.  If the procedure report does not answer your questions, please call your gastroenterologist to clarify.  If you requested that your care partner not be given the details of your procedure findings, then the procedure report has been included in a sealed envelope for you to review at your convenience later.  YOU SHOULD EXPECT: Some feelings of bloating in the abdomen. Passage of more gas than usual.  Walking can help get rid of the air that was put into your GI tract during the procedure and reduce the bloating. If you had a lower endoscopy (such as a colonoscopy or flexible sigmoidoscopy) you may notice spotting of blood in your stool or on the toilet paper. If you underwent a bowel prep for your procedure, you may not have a normal bowel movement for a few days.  Please Note:  You might notice some irritation and congestion in your nose or some drainage.  This is from the oxygen used during your procedure.  There is no need for concern and it should clear up in a day or so.  SYMPTOMS TO REPORT IMMEDIATELY:  Following lower endoscopy (colonoscopy or flexible sigmoidoscopy):  Excessive amounts of blood in the stool  Significant tenderness or worsening of abdominal pains  Swelling of the abdomen that is new, acute  Fever of 100F or higher  For urgent or emergent issues, a gastroenterologist can be reached at any hour by calling [phone]. Do not use MyChart messaging for urgent concerns.    DIET:  We do recommend a small meal at first, but then you may proceed to your  regular diet.  Drink plenty of fluids but you should avoid alcoholic beverages for 24 hours.  ACTIVITY:  You should plan to take it easy for the rest of today and you should NOT DRIVE or use heavy machinery until tomorrow (because of the sedation medicines used during the test).    FOLLOW UP: Our staff will call the number listed on your records the next business day following your procedure.  We will call around 7:15- 8:00 am to check on you and address any questions or concerns that you may have regarding the information given to you following your procedure. If we do not reach you, we will leave a message.     If any biopsies were taken you will be contacted by phone or by letter within the next 1-3 weeks.  Please call us at [phone] if you have not heard about the biopsies in 3 weeks.    SIGNATURES/CONFIDENTIALITY: You and/or your care partner have signed paperwork which will be entered into your electronic medical record.  These signatures attest to the fact that that the information above on your After Visit Summary has been reviewed and is understood.  Full responsibility of the confidentiality of this discharge information lies with you and/or your care-partner.

## 2023-10-26 NOTE — Op Note (Signed)
[facility] Patient Name: Kathy Conway Procedure Date: 10/26/2023 2:04 PM MRN: [id number] Endoscopist: Lorin Picket E. Tomasa Rand , MD, [id number] Age: 48 Referring MD:  Date of Birth: 12/29/1975 Gender: Female Account #: [id number] Procedure:                Colonoscopy Indications:              Screening for colorectal malignant neoplasm, This                            is the patient's first colonoscopy Medicines:                Monitored Anesthesia Care Procedure:                Pre-Anesthesia Assessment:                           - Prior to the procedure, a History and Physical                            was performed, and patient medications and                            allergies were reviewed. The patient's tolerance of                            previous anesthesia was also reviewed. The risks                            and benefits of the procedure and the sedation                            options and risks were discussed with the patient.                            All questions were answered, and informed consent                            was obtained. Prior Anticoagulants: The patient has                            taken no anticoagulant or antiplatelet agents. ASA                            Grade Assessment: II - A patient with mild systemic                            disease. After reviewing the risks and benefits,                            the patient was deemed in satisfactory condition to                            undergo the procedure.  After obtaining informed consent, the colonoscope                            was passed under direct vision. Throughout the                            procedure, the patient's blood pressure, pulse, and                            oxygen saturations were monitored continuously. The                            Olympus Scope SN [id number] was introduced through the                            anus and  advanced to the the terminal ileum, with                            identification of the appendiceal orifice and IC                            valve. The colonoscopy was performed without                            difficulty. The patient tolerated the procedure                            well. The quality of the bowel preparation was fair                            in that there was copious semisolid stool                            throughout the entire colon. The terminal ileum,                            ileocecal valve, appendiceal orifice, and rectum                            were photographed. The bowel preparation used was                            Miralax via split dose instruction. Scope In: 2:26:48 PM Scope Out: 2:56:06 PM Scope Withdrawal Time: 0 hours 16 minutes 56 seconds  Total Procedure Duration: 0 hours 29 minutes 18 seconds  Findings:                 The perianal and digital rectal examinations were                            normal. Pertinent negatives include normal                            sphincter tone and no palpable rectal lesions.  A 5 mm polyp was found in the splenic flexure. The                            polyp was sessile. The polyp was removed with a                            cold snare. Resection and retrieval were complete.                            Estimated blood loss was minimal.                           A few small-mouthed diverticula were found in the                            sigmoid colon and descending colon.                           The exam was otherwise normal throughout the                            examined colon.                           The terminal ileum appeared normal.                           The retroflexed view of the distal rectum and anal                            verge was normal and showed no anal or rectal                            abnormalities. Complications:            No immediate  complications. Estimated Blood Loss:     Estimated blood loss was minimal. Impression:               - Preparation of the colon was fair.                           - One 5 mm polyp at the splenic flexure, removed                            with a cold snare. Resected and retrieved.                           - Mild diverticulosis in the sigmoid colon and in                            the descending colon.                           - The examined portion of the ileum was normal.                           -  The distal rectum and anal verge are normal on                            retroflexion view. Recommendation:           - Patient has a contact number available for                            emergencies. The signs and symptoms of potential                            delayed complications were discussed with the                            patient. Return to normal activities tomorrow.                            Written discharge instructions were provided to the                            patient.                           - Resume previous diet.                           - Continue present medications.                           - Await pathology results.                           - Repeat colonoscopy in 3 years because the bowel                            preparation was suboptimal. Tavares Levinson E. Tomasa Rand, MD 10/26/2023 3:02:20 PM This report has been signed electronically.

## 2023-10-29 ENCOUNTER — Telehealth: Payer: Self-pay

## 2023-10-29 NOTE — Telephone Encounter (Signed)
Follow up call to pt, lm for pt to call if having any difficulty with normal activities or eating and drinking.  Also to call if any other questions or concerns.  

## 2023-10-31 ENCOUNTER — Encounter: Payer: Self-pay | Admitting: Gastroenterology

## 2023-10-31 LAB — SURGICAL PATHOLOGY

## 2023-10-31 NOTE — Progress Notes (Signed)
Kathy Conway,  The polyp which I removed during your recent procedure was proven to be completely benign but is considered a "pre-cancerous" polyp that MAY have grown into cancer if it had not been removed.  Studies shows that at least 20% of women over age 46 and 30% of men over age 7 have pre-cancerous polyps.    Because of suboptimal bowel prep quality, I recommend that you have a repeat colonoscopy in 3 years, as discussed.  If you develop any new rectal bleeding, abdominal pain or significant bowel habit changes, please contact me before then.

## 2023-11-01 DIAGNOSIS — M25511 Pain in right shoulder: Secondary | ICD-10-CM | POA: Diagnosis not present

## 2023-11-01 DIAGNOSIS — M542 Cervicalgia: Secondary | ICD-10-CM | POA: Diagnosis not present

## 2023-11-08 ENCOUNTER — Other Ambulatory Visit (HOSPITAL_COMMUNITY): Payer: Self-pay

## 2023-11-12 ENCOUNTER — Emergency Department (HOSPITAL_COMMUNITY): Payer: BC Managed Care – PPO

## 2023-11-12 ENCOUNTER — Emergency Department (HOSPITAL_COMMUNITY)
Admission: EM | Admit: 2023-11-12 | Discharge: 2023-11-12 | Disposition: A | Discharge status: Home / Self Care | Payer: BC Managed Care – PPO | Attending: Emergency Medicine | Admitting: Emergency Medicine

## 2023-11-12 DIAGNOSIS — Z91040 Latex allergy status: Secondary | ICD-10-CM | POA: Insufficient documentation

## 2023-11-12 DIAGNOSIS — R10819 Abdominal tenderness, unspecified site: Secondary | ICD-10-CM | POA: Diagnosis not present

## 2023-11-12 DIAGNOSIS — R161 Splenomegaly, not elsewhere classified: Secondary | ICD-10-CM | POA: Diagnosis not present

## 2023-11-12 DIAGNOSIS — R9431 Abnormal electrocardiogram [ECG] [EKG]: Secondary | ICD-10-CM | POA: Diagnosis not present

## 2023-11-12 DIAGNOSIS — K5730 Diverticulosis of large intestine without perforation or abscess without bleeding: Secondary | ICD-10-CM | POA: Diagnosis not present

## 2023-11-12 DIAGNOSIS — I2510 Atherosclerotic heart disease of native coronary artery without angina pectoris: Secondary | ICD-10-CM | POA: Diagnosis not present

## 2023-11-12 DIAGNOSIS — Z9049 Acquired absence of other specified parts of digestive tract: Secondary | ICD-10-CM | POA: Diagnosis not present

## 2023-11-12 DIAGNOSIS — R109 Unspecified abdominal pain: Secondary | ICD-10-CM | POA: Diagnosis not present

## 2023-11-12 DIAGNOSIS — D72829 Elevated white blood cell count, unspecified: Secondary | ICD-10-CM | POA: Diagnosis not present

## 2023-11-12 DIAGNOSIS — K529 Noninfective gastroenteritis and colitis, unspecified: Secondary | ICD-10-CM

## 2023-11-12 LAB — URINALYSIS, ROUTINE W REFLEX MICROSCOPIC
Bilirubin Urine: NEGATIVE
Glucose, UA: NEGATIVE mg/dL
Ketones, ur: 20 mg/dL — AB
Leukocytes,Ua: NEGATIVE
Nitrite: NEGATIVE
Protein, ur: 30 mg/dL — AB
Specific Gravity, Urine: 1.036 — ABNORMAL HIGH (ref 1.005–1.030)
pH: 5 (ref 5.0–8.0)

## 2023-11-12 LAB — CBC
HCT: 46.6 % — ABNORMAL HIGH (ref 36.0–46.0)
Hemoglobin: 16.1 g/dL — ABNORMAL HIGH (ref 12.0–15.0)
MCH: 31.4 pg (ref 26.0–34.0)
MCHC: 34.5 g/dL (ref 30.0–36.0)
MCV: 90.8 fL (ref 80.0–100.0)
Platelets: 208 10*3/uL (ref 150–400)
RBC: 5.13 MIL/uL — ABNORMAL HIGH (ref 3.87–5.11)
RDW: 13.3 % (ref 11.5–15.5)
WBC: 26 10*3/uL — ABNORMAL HIGH (ref 4.0–10.5)
nRBC: 0 % (ref 0.0–0.2)

## 2023-11-12 LAB — COMPREHENSIVE METABOLIC PANEL
ALT: 24 U/L (ref 0–44)
AST: 16 U/L (ref 15–41)
Albumin: 4.1 g/dL (ref 3.5–5.0)
Alkaline Phosphatase: 51 U/L (ref 38–126)
Anion gap: 13 (ref 5–15)
BUN: 22 mg/dL — ABNORMAL HIGH (ref 6–20)
CO2: 26 mmol/L (ref 22–32)
Calcium: 9.5 mg/dL (ref 8.9–10.3)
Chloride: 102 mmol/L (ref 98–111)
Creatinine, Ser: 0.77 mg/dL (ref 0.44–1.00)
GFR, Estimated: >60 mL/min (ref >60)
Glucose, Bld: 164 mg/dL — ABNORMAL HIGH (ref 70–99)
Potassium: 4.2 mmol/L (ref 3.5–5.1)
Sodium: 141 mmol/L (ref 135–145)
Total Bilirubin: 1.2 mg/dL (ref 0.0–1.2)
Total Protein: 7.2 g/dL (ref 6.5–8.1)

## 2023-11-12 LAB — LIPASE, BLOOD: Lipase: 22 U/L (ref 11–51)

## 2023-11-12 MED ORDER — ONDANSETRON HCL 4 MG PO TABS: 4.0000 mg | ORAL_TABLET | Freq: Four times a day (QID) | ORAL | 0 refills | Status: DC

## 2023-11-12 MED ORDER — LACTATED RINGERS IV BOLUS
1000.0000 mL | Freq: Once | INTRAVENOUS | Status: AC
  Administered 2023-11-12: 1000 mL via INTRAVENOUS

## 2023-11-12 MED ORDER — ONDANSETRON HCL 4 MG/2ML IJ SOLN
4.0000 mg | Freq: Once | INTRAMUSCULAR | Status: AC
  Administered 2023-11-12: 4 mg via INTRAVENOUS
  Filled 2023-11-12: qty 2

## 2023-11-12 MED ORDER — ONDANSETRON HCL 4 MG/2ML IJ SOLN
4.0000 mg | Freq: Once | INTRAMUSCULAR | Status: AC | PRN
  Administered 2023-11-12: 4 mg via INTRAVENOUS
  Filled 2023-11-12: qty 2

## 2023-11-12 MED ORDER — FENTANYL CITRATE PF 50 MCG/ML IJ SOSY
50.0000 ug | PREFILLED_SYRINGE | Freq: Once | INTRAMUSCULAR | Status: AC
Start: 2023-11-12 — End: 2023-11-12
  Administered 2023-11-12: 50 ug via INTRAVENOUS
  Filled 2023-11-12: qty 1

## 2023-11-12 NOTE — ED Notes (Signed)
Patient verbalizes understanding of discharge instructions. Opportunity for questioning and answers were provided. Armband removed by staff, pt discharged from ED. Pt ambulatory to ED waiting room with steady gait.  

## 2023-11-12 NOTE — Discharge Instructions (Addendum)
It was a pleasure taking part in your care.  As we discussed, it seems that you have a viral GI bug.  Please begin taking 4 mg of Zofran every 6 hours as needed for nausea and vomiting.  Please continue hydrating yourself with Pedialyte, water and other electrolyte supplementation beverages.  Please follow-up with your PCP for reevaluation.  Return to the ED with any new or worsening symptoms such as continued nausea or vomiting.

## 2023-11-12 NOTE — ED Provider Triage Note (Signed)
Emergency Medicine Provider Triage Evaluation Note  Kathy Conway , a 48 y.o. female  was evaluated in triage.  Pt complains of n/v today. Also has had some epigastric abdominal pain. Hx of hysterectomy and cholecystectomy. Smokes MJ daily. Gave herself a suppository earlier this morning and has had a BM today. Passing gas still.   Review of Systems  Positive: See above Negative: Fevers   Physical Exam  BP (!) 146/85 (BP Location: Right Arm)   Pulse 72   Temp (!) 96.5 F (35.8 C) (Axillary)   Resp 14   Ht 5\' 5"  (1.651 m)   Wt 95.3 kg   SpO2 100%   BMI 34.95 kg/m  Gen:   Awake, no distress   Resp:  Normal effort  MSK:   Moves extremities without difficulty  Other:  Holding emesis basin  Medical Decision Making  Medically screening exam initiated at 11:54 AM.  Appropriate orders placed.  Kathy Conway was informed that the remainder of the evaluation will be completed by another provider, this initial triage assessment does not replace that evaluation, and the importance of remaining in the ED until their evaluation is complete.    Rondel Baton, MD 11/12/23 1155

## 2023-11-12 NOTE — ED Triage Notes (Signed)
Patient from home with n/v/d and generalized abdominal pain since morning. Reports constipation x several months until this morning when she used a suppository.

## 2023-11-12 NOTE — ED Provider Notes (Cosign Needed)
[place] EMERGENCY DEPARTMENT AT [facility] Provider Note   CSN: [id number] Arrival date & time: 11/12/23  1046     History  Chief Complaint  Patient presents with   Abdominal Pain   Emesis   Diarrhea    Kathy Conway is a 48 y.o. female.  48 year old female presents today for concern of nausea, vomiting, abdominal pain.  Symptoms started this morning.  She states she ate something with mushrooms last night that she thinks may be causing her symptoms.  Her abdominal pain this started before the vomiting and diarrhea.  No blood in her stools.  The history is provided by the patient. No language interpreter was used.       Home Medications Prior to Admission medications   Medication Sig Start Date End Date Taking? Authorizing Provider  acetaminophen (TYLENOL) 500 MG tablet Take 500 mg by mouth every 6 (six) hours as needed for mild pain (pain score 1-3).   Yes [provider]  folic acid (FOLVITE) 1 MG tablet Take 1 mg by mouth daily.   Yes [provider]  losartan (COZAAR) 50 MG tablet Take 1 tablet (50 mg total) by mouth daily. 01/22/23  Yes Corky Crafts, MD  rosuvastatin (CRESTOR) 20 MG tablet Take 1 tablet (20 mg total) by mouth daily. 04/04/23  Yes Corky Crafts, MD  DULoxetine (CYMBALTA) 30 MG capsule Take 1 capsule (30 mg total) by mouth daily. Patient not taking: Reported on 05/23/2018 12/06/17 10/21/20  Marcine Matar, MD      Allergies    Codeine, Iodinated contrast media, Gabapentin, Iodine, and Latex    Review of Systems   Review of Systems  Constitutional:  Negative for chills and fever.  Respiratory:  Negative for shortness of breath.   Gastrointestinal:  Positive for abdominal pain, diarrhea, nausea and vomiting.  Genitourinary:  Negative for dysuria.  Neurological:  Negative for light-headedness.  All other systems reviewed and are negative.   Physical Exam Updated Vital Signs BP (!) 146/85 (BP Location:  Right Arm)   Pulse 72   Temp (!) 96.5 F (35.8 C) (Axillary)   Resp 14   Ht 5\' 5"  (1.651 m)   Wt 95.3 kg   SpO2 100%   BMI 34.95 kg/m  Physical Exam Vitals and nursing note reviewed.  Constitutional:      General: She is not in acute distress.    Appearance: Normal appearance. She is not ill-appearing.  HENT:     Head: Normocephalic and atraumatic.     Nose: Nose normal.  Eyes:     General: No scleral icterus.    Extraocular Movements: Extraocular movements intact.     Conjunctiva/sclera: Conjunctivae normal.  Cardiovascular:     Rate and Rhythm: Normal rate and regular rhythm.     Pulses: Normal pulses.     Heart sounds: Normal heart sounds.  Pulmonary:     Effort: Pulmonary effort is normal. No respiratory distress.     Breath sounds: Normal breath sounds. No wheezing or rales.  Abdominal:     General: There is no distension.     Tenderness: There is abdominal tenderness. There is no guarding.  Musculoskeletal:        General: Normal range of motion.     Cervical back: Normal range of motion.  Skin:    General: Skin is warm and dry.  Neurological:     General: No focal deficit present.     Mental Status: She is  alert. Mental status is at baseline.     ED Results / Procedures / Treatments   Labs (all labs ordered are listed, but only abnormal results are displayed) Labs Reviewed  COMPREHENSIVE METABOLIC PANEL - Abnormal; Notable for the following components:      Result Value   Glucose, Bld 164 (*)    BUN 22 (*)    All other components within normal limits  CBC - Abnormal; Notable for the following components:   WBC 26.0 (*)    RBC 5.13 (*)    Hemoglobin 16.1 (*)    HCT 46.6 (*)    All other components within normal limits  URINALYSIS, ROUTINE W REFLEX MICROSCOPIC - Abnormal; Notable for the following components:   Color, Urine AMBER (*)    APPearance HAZY (*)    Specific Gravity, Urine 1.036 (*)    Hgb urine dipstick SMALL (*)    Ketones, ur 20 (*)     Protein, ur 30 (*)    Bacteria, UA RARE (*)    All other components within normal limits  LIPASE, BLOOD    EKG None  Radiology No results found.  Procedures Procedures    Medications Ordered in ED Medications  fentaNYL (SUBLIMAZE) injection 50 mcg (has no administration in time range)  ondansetron (ZOFRAN) injection 4 mg (has no administration in time range)  lactated ringers bolus 1,000 mL (has no administration in time range)  ondansetron (ZOFRAN) injection 4 mg (4 mg Intravenous Given 11/12/23 1159)    ED Course/ Medical Decision Making/ A&P Clinical Course as of 11/12/23 1519  Mon Nov 12, 2023  1509 Gastroenteritis, abdominal pain starting before onset of N/V/D. Waiting CT scan. High leukocytosis. Onset this morning. 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 Follow up on scan.  [CG]    Clinical Course User Index [CG] Al Decant, PA-C                                 Medical Decision Making Amount and/or Complexity of Data Reviewed Labs: ordered. Radiology: ordered.  Risk Prescription drug management.   Medical Decision Making / ED Course   This patient presents to the ED for concern of abdominal pain, vomiting, diarrhea, this involves an extensive number of treatment options, and is a complaint that carries with it a high risk of complications and morbidity.  The differential diagnosis includes gastroenteritis, colitis, appendicitis  MDM: 48 year old female presents today for concern of abdominal pain, nausea, vomiting, diarrhea.  CBC shows leukocytosis of 26,000.  No anemia.  CMP with glucose 164 otherwise without acute concern.  UA without evidence of UTI.  CT ordered.  Will provide symptomatic control.  Signed out to oncoming provider pending CT and reevaluation.  Lab Tests: -I ordered, reviewed, and interpreted labs.   The pertinent results include:   Labs Reviewed  COMPREHENSIVE METABOLIC PANEL - Abnormal; Notable for the following components:       Result Value   Glucose, Bld 164 (*)    BUN 22 (*)    All other components within normal limits  CBC - Abnormal; Notable for the following components:   WBC 26.0 (*)    RBC 5.13 (*)    Hemoglobin 16.1 (*)    HCT 46.6 (*)    All other components within normal limits  URINALYSIS, ROUTINE W REFLEX MICROSCOPIC - Abnormal; Notable for the following components:   Color, Urine AMBER (*)  APPearance HAZY (*)    Specific Gravity, Urine 1.036 (*)    Hgb urine dipstick SMALL (*)    Ketones, ur 20 (*)    Protein, ur 30 (*)    Bacteria, UA RARE (*)    All other components within normal limits  LIPASE, BLOOD      EKG  EKG Interpretation Date/Time:    Ventricular Rate:    PR Interval:    QRS Duration:    QT Interval:    QTC Calculation:   R Axis:      Text Interpretation:           Imaging Studies ordered: I ordered imaging studies including CT abdomen pelvis ordered but not resulted at the end of my shift I independently visualized and interpreted imaging. I agree with the radiologist interpretation   Medicines ordered and prescription drug management: Meds ordered this encounter  Medications   ondansetron (ZOFRAN) injection 4 mg   fentaNYL (SUBLIMAZE) injection 50 mcg   ondansetron (ZOFRAN) injection 4 mg   lactated ringers bolus 1,000 mL    -I have reviewed the patients home medicines and have made adjustments as needed  Reevaluation: After the interventions noted above, I reevaluated the patient and found that they have :improved some improvement after the initial dose of Zofran.  Co morbidities that complicate the patient evaluation  Past Medical History:  Diagnosis Date   Arthritis    Asthma 10/1993   CAD (coronary artery disease)    Fatty liver    Fibromyalgia    H/O degenerative disc disease    IBS (irritable bowel syndrome) 10/2003   Migraine 10/1996   Renal disorder    Sciatica    Vertigo 10/2009      Dispostion: Signed out to oncoming  provider pending imaging and reevaluation.  If CT without concerning findings and patient is improved symptomatically she is appropriate for discharge.   Final Clinical Impression(s) / ED Diagnoses Final diagnoses:  None    Rx / DC Orders ED Discharge Orders     None         Marita Kansas, PA-C 11/12/23 1522

## 2023-11-12 NOTE — ED Provider Notes (Signed)
  Physical Exam  BP (!) 148/84   Pulse 67   Temp 98.4 F (36.9 C) (Oral)   Resp 13   Ht 5\' 5"  (1.651 m)   Wt 95.3 kg   SpO2 98%   BMI 34.95 kg/m   Physical Exam Vitals and nursing note reviewed.  Constitutional:      General: She is not in acute distress.    Appearance: She is well-developed.  HENT:     Head: Normocephalic and atraumatic.  Eyes:     Conjunctiva/sclera: Conjunctivae normal.  Cardiovascular:     Rate and Rhythm: Normal rate and regular rhythm.     Heart sounds: No murmur heard. Pulmonary:     Effort: Pulmonary effort is normal. No respiratory distress.     Breath sounds: Normal breath sounds.  Abdominal:     Palpations: Abdomen is soft.     Tenderness: There is no abdominal tenderness.  Musculoskeletal:        General: No swelling.     Cervical back: Neck supple.  Skin:    General: Skin is warm and dry.     Capillary Refill: Capillary refill takes less than 2 seconds.  Neurological:     Mental Status: She is alert.  Psychiatric:        Mood and Affect: Mood normal.     Procedures  Procedures  ED Course / MDM   Clinical Course as of 11/12/23 2035  Mon Nov 12, 2023  1509 Gastroenteritis, abdominal pain starting before onset of N/V/D. Waiting CT scan. High leukocytosis. Onset this morning. 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 Follow up on scan.  [CG]    Clinical Course User Index [CG] Al Decant, PA-C   Medical Decision Making Amount and/or Complexity of Data Reviewed Labs: ordered. Radiology: ordered.  Risk Prescription drug management.   Patient signed out to me at shift change pending imaging, reevaluation.  Please see HPI for further details.  In short, 48 year old female presents with probable viral gastroenteritis.  She has had abdominal pain with preceding nausea vomiting and diarrhea.  Patient had a high leukocytosis to 26.  Onset this morning.  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  Plan was to  follow-up on scan.  CT scan shows no acute process.  Patient advised of pulmonary nodule need for follow-up and she voiced understanding.  Patient was given additional dose of Zofran and had no nausea or vomiting after this was given.  She passed p.o. fluid challenge.  At this time, most likely cause of patient's symptoms due to viral gastroenteritis.  She will follow-up with her PCP.  Will send her home with Zofran.  Have given her strict return precautions and she voiced understanding.  She is stable to discharge home.       Al Decant, PA-C 11/12/23 2035    Wynetta Fines, MD 11/12/23 [phone]

## 2023-11-16 DIAGNOSIS — M25511 Pain in right shoulder: Secondary | ICD-10-CM | POA: Diagnosis not present

## 2023-11-22 DIAGNOSIS — M25511 Pain in right shoulder: Secondary | ICD-10-CM | POA: Diagnosis not present

## 2023-12-03 ENCOUNTER — Other Ambulatory Visit (HOSPITAL_COMMUNITY): Payer: Self-pay

## 2023-12-04 ENCOUNTER — Other Ambulatory Visit (HOSPITAL_COMMUNITY)
Admission: RE | Admit: 2023-12-04 | Discharge: 2023-12-04 | Disposition: A | Discharge status: Home / Self Care | Payer: Self-pay | Source: Ambulatory Visit | Attending: Medical Genetics | Admitting: Medical Genetics

## 2023-12-04 ENCOUNTER — Other Ambulatory Visit (HOSPITAL_COMMUNITY): Payer: Self-pay

## 2023-12-16 LAB — GENECONNECT MOLECULAR SCREEN: Genetic Analysis Overall Interpretation: NEGATIVE

## 2024-01-03 ENCOUNTER — Ambulatory Visit
Admission: RE | Admit: 2024-01-03 | Discharge: 2024-01-03 | Disposition: A | Discharge status: Home / Self Care | Payer: BC Managed Care – PPO | Source: Ambulatory Visit | Attending: Internal Medicine | Admitting: Internal Medicine

## 2024-01-03 DIAGNOSIS — Z1231 Encounter for screening mammogram for malignant neoplasm of breast: Secondary | ICD-10-CM | POA: Diagnosis not present

## 2024-01-08 ENCOUNTER — Encounter: Payer: Self-pay | Admitting: Family Medicine

## 2024-01-22 ENCOUNTER — Other Ambulatory Visit: Payer: Self-pay | Admitting: Interventional Cardiology

## 2024-02-04 ENCOUNTER — Other Ambulatory Visit: Payer: Self-pay | Admitting: Interventional Cardiology

## 2024-03-12 ENCOUNTER — Other Ambulatory Visit: Payer: Self-pay | Admitting: Nurse Practitioner

## 2024-03-14 ENCOUNTER — Other Ambulatory Visit: Payer: Self-pay

## 2024-03-14 MED ORDER — ROSUVASTATIN CALCIUM 20 MG PO TABS: 20.0000 mg | ORAL_TABLET | Freq: Every day | ORAL | 0 refills | Status: DC

## 2024-03-23 ENCOUNTER — Other Ambulatory Visit: Payer: Self-pay | Admitting: Interventional Cardiology

## 2024-03-23 ENCOUNTER — Other Ambulatory Visit: Payer: Self-pay | Admitting: Physician Assistant

## 2024-04-26 ENCOUNTER — Other Ambulatory Visit: Payer: Self-pay | Admitting: Interventional Cardiology

## 2024-04-28 ENCOUNTER — Other Ambulatory Visit: Payer: Self-pay

## 2024-04-28 NOTE — Telephone Encounter (Signed)
 Sent MyChart message to patient informing her overdue office visit needed for refills.

## 2024-04-29 NOTE — Telephone Encounter (Signed)
 Will also send a message to scheduling to assist w appointment.

## 2024-05-08 ENCOUNTER — Telehealth: Payer: Self-pay | Admitting: Internal Medicine

## 2024-05-08 NOTE — Telephone Encounter (Signed)
 Called pt to confirm appt LVM

## 2024-05-09 ENCOUNTER — Encounter: Payer: Self-pay | Admitting: Internal Medicine

## 2024-05-09 ENCOUNTER — Ambulatory Visit: Payer: Self-pay | Attending: Internal Medicine | Admitting: Internal Medicine

## 2024-05-09 VITALS — BP 117/77 | HR 64 | Temp 97.9°F | Ht 65.0 in | Wt 205.0 lb

## 2024-05-09 DIAGNOSIS — Z0000 Encounter for general adult medical examination without abnormal findings: Secondary | ICD-10-CM

## 2024-05-09 DIAGNOSIS — R7303 Prediabetes: Secondary | ICD-10-CM | POA: Diagnosis not present

## 2024-05-09 DIAGNOSIS — I83899 Varicose veins of unspecified lower extremities with other complications: Secondary | ICD-10-CM | POA: Diagnosis not present

## 2024-05-09 DIAGNOSIS — E782 Mixed hyperlipidemia: Secondary | ICD-10-CM | POA: Diagnosis not present

## 2024-05-09 DIAGNOSIS — Z114 Encounter for screening for human immunodeficiency virus [HIV]: Secondary | ICD-10-CM | POA: Diagnosis not present

## 2024-05-09 DIAGNOSIS — I10 Essential (primary) hypertension: Secondary | ICD-10-CM

## 2024-05-09 DIAGNOSIS — F17200 Nicotine dependence, unspecified, uncomplicated: Secondary | ICD-10-CM | POA: Diagnosis not present

## 2024-05-09 DIAGNOSIS — E66811 Obesity, class 1: Secondary | ICD-10-CM | POA: Diagnosis not present

## 2024-05-09 DIAGNOSIS — R0789 Other chest pain: Secondary | ICD-10-CM

## 2024-05-09 DIAGNOSIS — R232 Flushing: Secondary | ICD-10-CM

## 2024-05-09 MED ORDER — ROSUVASTATIN CALCIUM 20 MG PO TABS: 20.0000 mg | ORAL_TABLET | Freq: Every day | ORAL | 1 refills | Status: DC

## 2024-05-09 MED ORDER — LOSARTAN POTASSIUM 50 MG PO TABS: 50.0000 mg | ORAL_TABLET | Freq: Every day | ORAL | 1 refills | Status: DC

## 2024-05-09 NOTE — Progress Notes (Signed)
 Patient ID: Kathy Conway, female    DOB: 12/30/1975  MRN: [id number]  CC: Annual Exam (Physical. Med refill. /Varicose veins on bilateral legs - painful legs )   Subjective: Kathy Conway is a 48 y.o. female who presents for physical Her concerns today include:  Pt with hx of HTN, obesity, prediabetes, chronic LBP, fibromyalgia, asthma,anxiety, vertigo, mood disorder,  former tob dep and bipolar, migraines.   Discussed the use of AI scribe software for clinical note transcription with the patient, who gave verbal consent to proceed.  History of Present Illness Kathy Conway is a 48 year old female with hypertension and coronary artery disease who presents for an annual physical exam.  She last had an in-person visit in February of last year and a video visit in May of last year. She takes Cozaar  50 mg daily and checks her blood pressure every couple of days. She checks her blood pressure every couple of days, noting low readings, with the last being 116/?32. She has been limiting her salt intake and has adopted a mostly vegetarian diet, resulting in a weight loss of 30 pounds since February of last year. She avoids sodas and sugary foods, drinks water, coffee, and tea with limited sugar, and consumes raw fruits and vegetables. She rarely eats meat, opting for chicken when she does, and avoids fried foods. She reports a history of prediabetes and had mildly elevated blood sugar in a recent test  She engages in regular physical activity, including walking and climbing stairs, and reports increased energy levels. 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 She tracks her steps using her phone, with a recent count of 1,417 steps in a day.  She has resumed smoking due to stress, smoking about half a pack a week, but is attempting to quit again. She experiences symptoms suggestive of menopause, including night sweats and hot flashes, and reports brain  fog.  She has varicose veins that cause pain, especially when shaving or standing, and they protrude visibly. The discomfort from the veins led her to stop working at a previous job where she stood on concrete all day. She does not currently have insurance, which limits her ability to seek treatment for the veins.  She has experienced chest pain radiating to her left arm, typically associated with stress but not with activity.  She has seen a cardiologist for evaluation 1 yr ago and had a  coronary CT; she recalls being told she has nonobstructive coronary artery disease. She was previously prescribed  Crestor  but has not taken it recently due to running out of medication a few wks ago.  She had a hysterectomy in 2010 but retains her ovaries.  She reports having some hot flashes.  She has had recent dental work, including fillings and cleanings, and maintains good oral hygiene. She wears bifocals and had a recent eye exam, which showed some loss of fluid in the retina. She has not had a hearing test but reports partial hearing loss in one ear.    Patient Active Problem List   Diagnosis Date Noted   Lung nodule, solitary 03/09/2023   Fatty liver 01/22/2023   Aortic atherosclerosis (HCC) 01/22/2023   Essential hypertension 01/22/2023   Recurrent boils 08/26/2021   Osteoarthritis of lumbar spine 08/06/2017   Class 3 severe obesity due to excess calories without serious comorbidity with body mass index (BMI) of 40.0 to 44.9 in adult 08/06/2017  History of gestational diabetes 04/17/2017   Bilateral hearing loss 03/24/2017   Acute pain of right foot 03/24/2017   Insomnia 03/05/2017   Plantar fasciitis, left 10/23/2016   Chronic bilateral low back pain with bilateral sciatica 09/08/2016   IBS (irritable bowel syndrome) 05/23/2016   Asthma    Boils 03/10/2016   Anxiety state 03/10/2016   H/O degenerative disc disease    Fibromyalgia    Migraine 10/09/1996     Current Outpatient  Medications on File Prior to Visit  Medication Sig Dispense Refill   [DISCONTINUED] DULoxetine  (CYMBALTA ) 30 MG capsule Take 1 capsule (30 mg total) by mouth daily. (Patient not taking: Reported on 05/23/2018) 30 capsule 3   No current facility-administered medications on file prior to visit.    Allergies  Allergen Reactions   Codeine Shortness Of Breath   Iodinated Contrast Media Itching and Other (See Comments)    Pt experience itching and chest pressure after IV contrast during a CTA Coronary on 04/02/23.  Had to be given IV benadryl     Gabapentin      Caused issues with her memory.   Iodine     Hives   Latex Itching, Swelling and Rash    Social History   Socioeconomic History   Marital status: Married    Spouse name: Not on file   Number of children: 2   Years of education: Not on file   Highest education level: 9th grade  Occupational History   Occupation: shipment  Tobacco Use   Smoking status: Former    Current packs/day: 0.50    Types: Cigarettes    Passive exposure: Current   Smokeless tobacco: Never  Vaping Use   Vaping status: Never Used  Substance and Sexual Activity   Alcohol use: Not Currently    Alcohol/week: 2.0 standard drinks of alcohol    Types: 2 Shots of liquor per week    Comment: once a month   Drug use: No   Sexual activity: Yes    Partners: Male    Birth control/protection: Surgical    Comment: hysterectomy  Other Topics Concern   Not on file  Social History Narrative   Right handed   Caffeine intake 5 cups   Social Drivers of Health   Financial Resource Strain: Low Risk  (05/05/2024)   Overall Financial Resource Strain (CARDIA)    Difficulty of Paying Living Expenses: Not very hard  Food Insecurity: No Food Insecurity (05/05/2024)   Hunger Vital Sign    Worried About Running Out of Food in the Last Year: Never true    Ran Out of Food in the Last Year: Never true  Transportation Needs: No Transportation Needs (05/05/2024)   PRAPARE -  Administrator, Civil Service (Medical): No    Lack of Transportation (Non-Medical): No  Physical Activity: Insufficiently Active (05/05/2024)   Exercise Vital Sign    Days of Exercise per Week: 3 days    Minutes of Exercise per Session: 30 min  Stress: Stress Concern Present (05/05/2024)   [facility] [employer] - Occupational Stress Questionnaire    Feeling of Stress: To some extent  Social Connections: Moderately Integrated (05/05/2024)   Social Connection and Isolation Panel    Frequency of Communication with Friends and Family: More than three times a week    Frequency of Social Gatherings with Friends and Family: More than three times a week    Attends Religious Services: More than 4 times per year  Active Member of Clubs or Organizations: No    Attends Engineer, structural: Not on file    Marital Status: Married  Catering manager Violence: Not on file    Family History  Problem Relation Age of Onset   Brain cancer Mother    Cancer Father        hip   Cancer Sister        HPV   Heart failure Maternal Grandfather    Diabetes Paternal Aunt    Esophageal cancer Neg Hx     Past Surgical History:  Procedure Laterality Date   ABDOMINAL HYSTERECTOMY     has bilateral ovaries   CHOLECYSTECTOMY     TUBAL LIGATION      ROS: Review of Systems  Respiratory:  Negative for cough and shortness of breath.   Gastrointestinal:  Negative for abdominal pain.  Genitourinary:  Negative for difficulty urinating.   PHYSICAL EXAM: BP 117/77 (BP Location: Left Arm, Patient Position: Sitting, Cuff Size: Normal)   Pulse 64   Temp 97.9 F (36.6 C) (Oral)   Ht 5' 5 (1.651 m)   Wt 205 lb (93 kg)   SpO2 97%   BMI 34.11 kg/m   Wt Readings from Last 3 Encounters:  05/09/24 205 lb (93 kg)  11/12/23 210 lb (95.3 kg)  10/26/23 217 lb (98.4 kg)    Physical Exam  General appearance - alert, well appearing, obese middle-age Caucasian female and in  no distress Mental status - normal mood, behavior, speech, dress, motor activity, and thought processes Eyes - pupils equal and reactive, extraocular eye movements intact Ears - bilateral TM's and external ear canals normal Nose - normal and patent, no erythema, discharge or polyps Mouth - mucous membranes moist, pharynx normal without lesions Neck - supple, no significant adenopathy Lymphatics - no palpable lymphadenopathy, no hepatosplenomegaly Chest - clear to auscultation, no wheezes, rales or rhonchi, symmetric air entry Heart - normal rate, regular rhythm, normal S1, S2, no murmurs, rubs, clicks or gallops Abdomen - soft, nontender, nondistended, no masses or organomegaly Musculoskeletal - no joint tenderness, deformity or swelling Extremities -she has mild to moderate protruding varicose veins in both lower legs and thigh.  None appear actively inflamed at this time. Skin -no rash or abnormal appearing moles seen      Latest Ref Rng & Units 11/12/2023   11:55 AM 07/03/2023    7:29 AM 03/05/2023    8:55 PM  CMP  Glucose 70 - 99 mg/dL 835   867   BUN 6 - 20 mg/dL 22   12   Creatinine 9.55 - 1.00 mg/dL 9.22   9.33   Sodium 864 - 145 mmol/L 141   139   Potassium 3.5 - 5.1 mmol/L 4.2   3.7   Chloride 98 - 111 mmol/L 102   104   CO2 22 - 32 mmol/L 26   24   Calcium  8.9 - 10.3 mg/dL 9.5   9.1   Total Protein 6.5 - 8.1 g/dL 7.2  6.7    Total Bilirubin 0.0 - 1.2 mg/dL 1.2  0.6    Alkaline Phos 38 - 126 U/L 51  67    AST 15 - 41 U/L 16  13    ALT 0 - 44 U/L 24  16     Lipid Panel     Component Value Date/Time   CHOL 95 (L) 07/03/2023 0729   TRIG 89 07/03/2023 0729   HDL 40 07/03/2023 0729  CHOLHDL 2.4 07/03/2023 0729   LDLCALC 37 07/03/2023 0729    CBC    Component Value Date/Time   WBC 26.0 (H) 11/12/2023 1155   RBC 5.13 (H) 11/12/2023 1155   HGB 16.1 (H) 11/12/2023 1155   HCT 46.6 (H) 11/12/2023 1155   PLT 208 11/12/2023 1155   MCV 90.8 11/12/2023 1155   MCH 31.4  11/12/2023 1155   MCHC 34.5 11/12/2023 1155   RDW 13.3 11/12/2023 1155   LYMPHSABS 2.3 03/05/2023 2055   MONOABS 0.5 03/05/2023 2055   EOSABS 0.3 03/05/2023 2055   BASOSABS 0.0 03/05/2023 2055    ASSESSMENT AND PLAN: 1. Annual physical exam (Primary) Commended her on changes in eating habits.  Encouraged her to keep up the good work. Encouraged her to get in some form of moderate intensity exercise at least 5 days a week for 30 minutes.  2. Essential hypertension At goal.  Continue Cozaar  50 mg daily - CBC  3. Tobacco dependence Strongly advised to quit.  Patient states that she is systematically cutting back with hopes of quitting.  4. Symptomatic spider varicose vein Advised patient to apply for Medicaid.  Once approved we can refer to vein and vascular.  5. Other chest pain Will get her back in with cardiology to evaluate. - Ambulatory referral to Cardiology  6. Hot flashes Advised of the importance of wearing breathable clothing, keeping temperature at night set to a comfortable level and avoiding drinking too much caffeine  7. Screening for HIV (human immunodeficiency virus) Patient agreeable to screening. - HIV antibody (with reflex)  8. Obesity (BMI 30.0-34.9) See #1 above. - Hemoglobin A1c  9. Prediabetes See #1 above. - Hemoglobin A1c  10. Mixed hyperlipidemia Refill sent on Crestor . - Lipid panel   Patient was given the opportunity to ask questions.  Patient verbalized understanding of the plan and was able to repeat key elements of the plan.   This documentation was completed using Paediatric nurse.  Any transcriptional errors are unintentional.  Orders Placed This Encounter  Procedures   HIV antibody (with reflex)   Lipid panel   Hemoglobin A1c   CBC   Ambulatory referral to Cardiology     Requested Prescriptions   Signed Prescriptions Disp Refills   losartan  (COZAAR ) 50 MG tablet 90 tablet 1    Sig: Take 1 tablet (50 mg  total) by mouth daily.   rosuvastatin  (CRESTOR ) 20 MG tablet 90 tablet 1    Sig: Take 1 tablet (20 mg total) by mouth daily.    Return in about 6 months (around 11/09/2024).  Barnie Louder, MD, FACP

## 2024-05-09 NOTE — Patient Instructions (Addendum)
 VISIT SUMMARY:  Today, you had your annual physical exam. We discussed your overall health, including your well-controlled blood pressure, significant weight loss, and your current medications. We also addressed your varicose veins, smoking habits, menopausal symptoms, coronary artery disease, chest pain, fatty liver disease, and prediabetes. You are doing well with your diet and exercise, and we have made some plans to help manage your other health concerns.  YOUR PLAN:  -ADULT WELLNESS VISIT: Your blood pressure is well-controlled, and you have achieved significant weight loss through diet and exercise. Continue with your current diet and exercise regimen, monitor your blood pressure at home, and keep up with your weight management efforts.  -HYPERTENSION: Your blood pressure is well-controlled with your current medication, Cozaar  50 mg daily. Keep taking this medication as prescribed and continue to monitor your blood pressure at home regularly.  -HYPERLIPIDEMIA: You have not been taking your cholesterol medication, Crestor , because you ran out. We have sent a refill for Crestor . Please resume taking it as prescribed.  -VARICOSE VEINS WITH PAIN: You have painful varicose veins that are worse when standing. We recommend applying for Medicaid to potentially cover vein treatment and will consider referring you to a specialist once you have insurance.  -TOBACCO USE DISORDER: You are smoking about half a pack per week but are ready to quit and are gradually reducing your smoking. Keep working on gradually reducing your smoking.  -MENOPAUSAL SYMPTOMS (HOT FLASHES, NIGHT SWEATS, BRAIN FOG): You are experiencing hot flashes, night sweats, and brain fog, which are common symptoms of menopause. Wear breathable clothing and maintain a comfortable temperature at home to help manage these symptoms.  -CORONARY ARTERY DISEASE, NONOBSTRUCTIVE: You have nonobstructive coronary artery disease, which means there  is a low likelihood of significant blockage in your arteries. We will refer you to cardiology for further evaluation.  -CHEST PAIN, LIKELY NON-CARDIAC: You have intermittent chest pain that is likely related to stress and anxiety. We will refer you to cardiology for further evaluation to ensure there are no other concerns.  -PREDIABETES: You have mildly elevated blood sugar levels, which indicates prediabetes. We will screen for diabetes with a current blood test to monitor your condition.  INSTRUCTIONS:  Please follow up with cardiology for further evaluation of your coronary artery disease and chest pain. Additionally, apply for Medicaid to explore coverage options for your varicose vein treatment. Continue monitoring your blood pressure at home and resume taking Crestor  as prescribed.

## 2024-05-10 ENCOUNTER — Ambulatory Visit: Payer: Self-pay | Admitting: Internal Medicine

## 2024-05-10 LAB — LIPID PANEL
Chol/HDL Ratio: 3.4 ratio (ref 0.0–4.4)
Cholesterol, Total: 148 mg/dL (ref 100–199)
HDL: 43 mg/dL (ref >39)
LDL Chol Calc (NIH): 73 mg/dL (ref 0–99)
Triglycerides: 190 mg/dL — ABNORMAL HIGH (ref 0–149)
VLDL Cholesterol Cal: 32 mg/dL (ref 5–40)

## 2024-05-10 LAB — CBC
Hematocrit: 40.3 % (ref 34.0–46.6)
Hemoglobin: 13.6 g/dL (ref 11.1–15.9)
MCH: 31.3 pg (ref 26.6–33.0)
MCHC: 33.7 g/dL (ref 31.5–35.7)
MCV: 93 fL (ref 79–97)
Platelets: 187 x10E3/uL (ref 150–450)
RBC: 4.34 x10E6/uL (ref 3.77–5.28)
RDW: 12.6 % (ref 11.7–15.4)
WBC: 9.4 x10E3/uL (ref 3.4–10.8)

## 2024-05-10 LAB — HEMOGLOBIN A1C
Est. average glucose Bld gHb Est-mCnc: 103 mg/dL
Hgb A1c MFr Bld: 5.2 % (ref 4.8–5.6)

## 2024-05-10 LAB — HIV ANTIBODY (ROUTINE TESTING W REFLEX): HIV Screen 4th Generation wRfx: NONREACTIVE

## 2024-06-19 ENCOUNTER — Encounter: Payer: Self-pay | Admitting: Internal Medicine

## 2024-06-27 ENCOUNTER — Telehealth: Payer: Self-pay | Admitting: Internal Medicine

## 2024-06-27 NOTE — Telephone Encounter (Signed)
 Copied from CRM [id number]. Topic: Referral - Request for Referral >> Jun 26, 2024  1:38 PM Antwanette L wrote: Did the patient discuss referral with their provider in the last year? Yes.  Appointment offered? No.  Last office visit with Dr. Vicci was on 05/09/24   Type of order/referral and detailed reason for visit: cardiology   Preference of office, provider, location: [facility]([address] [place], [place] [address]  If referral order, have you been seen by this specialty before? Yes. Patient was seen by Dr. Candyce Reek.    Can we respond through MyChart? Yes

## 2024-06-28 NOTE — Telephone Encounter (Signed)
 Pt calling about cardiology referral that I submitted 05/2024. Wants to see Dr. Candyce Reek.

## 2024-07-01 NOTE — Telephone Encounter (Signed)
 Called & spoke to the patient. Verified name & DOB. Informed of referral being submitted to [facility]. Patient expressed verbal understanding.

## 2024-07-06 ENCOUNTER — Ambulatory Visit: Admission: EM | Admit: 2024-07-06 | Discharge: 2024-07-06 | Disposition: A | Discharge status: Home / Self Care

## 2024-07-06 ENCOUNTER — Ambulatory Visit (INDEPENDENT_AMBULATORY_CARE_PROVIDER_SITE_OTHER)

## 2024-07-06 DIAGNOSIS — S5011XA Contusion of right forearm, initial encounter: Secondary | ICD-10-CM

## 2024-07-06 DIAGNOSIS — M79631 Pain in right forearm: Secondary | ICD-10-CM

## 2024-07-06 MED ORDER — DICLOFENAC SODIUM 50 MG PO TBEC: 50.0000 mg | DELAYED_RELEASE_TABLET | Freq: Two times a day (BID) | ORAL | 1 refills | Status: AC

## 2024-07-06 NOTE — ED Provider Notes (Signed)
 [facility]  Note:  This document was prepared using Conservation officer, historic buildings and may include unintentional dictation errors.  MRN: [id number] DOB: 1976/05/22  Subjective:   Kathy Conway is a 48 y.o. female presenting for right forearm pain x 1 day.  Patient reports that yesterday projector fell on her right forearm since that time she has had pain and swelling in the area of the trauma.  Patient denies taking any over-the-counter medication to treat symptoms.  Patient is unable to rotate forearm or flex wrist fully.  Patient has increased pain with movement of fingers and wrist.  Patient denies any past history of trauma or injury to the wrist prior to yesterday.  No current facility-administered medications for this encounter.  Current Outpatient Medications:    diclofenac  (VOLTAREN ) 50 MG EC tablet, Take 1 tablet (50 mg total) by mouth 2 (two) times daily., Disp: 30 tablet, Rfl: 1   losartan  (COZAAR ) 50 MG tablet, Take 1 tablet (50 mg total) by mouth daily., Disp: 90 tablet, Rfl: 1   rosuvastatin  (CRESTOR ) 20 MG tablet, Take 1 tablet (20 mg total) by mouth daily., Disp: 90 tablet, Rfl: 1   Allergies  Allergen Reactions   Codeine Shortness Of Breath   Iodinated Contrast Media Itching and Other (See Comments)    Pt experience itching and chest pressure after IV contrast during a CTA Coronary on 04/02/23.  Had to be given IV benadryl     Gabapentin      Caused issues with her memory.   Iodine     Hives   Latex Itching, Swelling and Rash    Past Medical History:  Diagnosis Date   Arthritis    Asthma 10/1993   CAD (coronary artery disease)    Fatty liver    Fibromyalgia    H/O degenerative disc disease    IBS (irritable bowel syndrome) 10/2003   Migraine 10/1996   Renal disorder    Sciatica    Vertigo 10/2009     Past Surgical History:  Procedure Laterality Date   ABDOMINAL HYSTERECTOMY     has bilateral ovaries   CHOLECYSTECTOMY     TUBAL LIGATION       Family History  Problem Relation Age of Onset   Brain cancer Mother    Cancer Father        hip   Cancer Sister        HPV   Heart failure Maternal Grandfather    Diabetes Paternal Aunt    Esophageal cancer Neg Hx     Social History   Tobacco Use   Smoking status: Former    Current packs/day: 0.50    Types: Cigarettes    Passive exposure: Current   Smokeless tobacco: Never  Vaping Use   Vaping status: Never Used  Substance Use Topics   Alcohol use: Not Currently    Alcohol/week: 2.0 standard drinks of alcohol    Types: 2 Shots of liquor per week    Comment: once a month   Drug use: No    ROS Refer to HPI for ROS details.  Objective:   Vitals: BP 109/74 (BP Location: Left Arm)   Pulse 64   Temp 98.3 F (36.8 C) (Oral)   Resp 18   SpO2 98%   Physical Exam Vitals and nursing note reviewed.  Constitutional:      General: She is not in acute distress.    Appearance: Normal appearance. She is well-developed. She is not ill-appearing or toxic-appearing.  HENT:     Head: Normocephalic and atraumatic.  Cardiovascular:     Rate and Rhythm: Normal rate.  Pulmonary:     Effort: Pulmonary effort is normal. No respiratory distress.  Musculoskeletal:        General: Normal range of motion.     Right forearm: Swelling, tenderness and bony tenderness present. No edema or deformity.  Skin:    General: Skin is warm and dry.  Neurological:     General: No focal deficit present.     Mental Status: She is alert and oriented to person, place, and time.  Psychiatric:        Mood and Affect: Mood normal.        Behavior: Behavior normal.     Procedures  No results found for this or any previous visit (from the past 24 hours).  DG Forearm Right Result Date: 07/06/2024 CLINICAL DATA:  Forearm pain. EXAM: RIGHT FOREARM - 2 VIEW COMPARISON:  None Available. FINDINGS: No acute fracture or dislocation is seen. Subtle mottled appearance of the bone at the radius with  areas of endosteal scalloping in the proximal radius, best seen on AP view. No periosteal elevation is seen. Soft tissues are unremarkable. IMPRESSION: 1. No acute fracture or dislocation. 2. Subtle mottled appearance of the radius with areas of endosteal scalloping. Differential diagnosis would include infection or malignancy. MRI should be considered for further evaluation. Electronically Signed   By: Leita Birmingham M.D.   On: 07/06/2024 15:36     Assessment and Plan :     Discharge Instructions       1. Contusion of right forearm, initial encounter (Primary) - DG Forearm Right x-ray performed in UC shows no acute fracture or dislocation of the right forearm. - Apply Sling & Swathe in UC to protect arm from further injury and to decrease movement. - diclofenac  (VOLTAREN ) 50 MG EC tablet; Take 1 tablet (50 mg total) by mouth 2 (two) times daily.  Dispense: 30 tablet; Refill: 1 -Continue to monitor symptoms for any change in severity if there is any escalation of current symptoms or development of new symptoms follow-up in ER for further evaluation and management.       Rosalin Buster B Cameka Rae   Jaiyden Laur, [place], [place] 07/06/24 [phone]

## 2024-07-06 NOTE — ED Triage Notes (Signed)
 Pt present right arm injury from a projector fall on her right arm. Pt states unable to do any range of motion with her arm or fingers

## 2024-07-06 NOTE — Discharge Instructions (Signed)
  1. Contusion of right forearm, initial encounter (Primary) - DG Forearm Right x-ray performed in UC shows no acute fracture or dislocation of the right forearm. - Apply Sling & Swathe in UC to protect arm from further injury and to decrease movement. - diclofenac  (VOLTAREN ) 50 MG EC tablet; Take 1 tablet (50 mg total) by mouth 2 (two) times daily.  Dispense: 30 tablet; Refill: 1 -Continue to monitor symptoms for any change in severity if there is any escalation of current symptoms or development of new symptoms follow-up in ER for further evaluation and management.

## 2024-07-07 ENCOUNTER — Ambulatory Visit: Payer: Self-pay

## 2024-07-07 NOTE — Progress Notes (Unsigned)
 Cardiology Office Note   Date:  07/08/2024  ID:  Kathy Conway, DOB [birth year], MRN [id number] PCP: Vicci Barnie NOVAK, MD  [facility] Providers Cardiologist:  Georganna Archer, MD   History of Present Illness Kathy Conway is a 48 y.o. female with a past medical history of palpitations.  She was seen last April with a complaint of palpitations for the last 2 months.  Lasting around 30 to 60 minutes and happening several times a week.  Was associated with dizziness but no passing out.  Can have sharp pains with the palpitations.  She denies exertional chest pain, dizziness, leg edema, nitroglycerin  use, orthopnea, PND, shortness of breath, and syncope.  For her job she has to lift heavy boxes.  This does not trigger any cardiac symptoms.  Ultimately, cardiac monitor was ordered at that visit.  She is here for her annual follow-up appointment. She has been experiencing intermittent sharp chest pain radiating to her shoulder, occurring at rest and with movement, often associated with anxiety and stress. She also has night sweats, anxiety, and palpitations, which she attributes to menopause.  Her coronary artery disease history includes a prior CT scan showing moderate disease in the LAD artery with 50-69% blockage. An FFR test was negative.  She has an iodine-mediated contrast allergy, previously causing a severe reaction with difficulty breathing and chest tightness. She will be premedicated before the test.   She has lost 136 pounds through dietary changes, reducing soda and sweets, increasing fruits and vegetables, and avoiding fried foods. She primarily consumes chicken and has reduced overall meat intake.  Reports no shortness of breath nor dyspnea on exertion.No edema, orthopnea, PND. Reports no palpitations.   Discussed the use of AI scribe software for clinical note transcription with the patient, who gave verbal consent to proceed.  ROS: pertinent ROS in HPI  Studies  Reviewed EKG Interpretation Date/Time:  Tuesday July 08 2024 08:25:18 EDT Ventricular Rate:  71 PR Interval:  160 QRS Duration:  88 QT Interval:  394 QTC Calculation: 428 R Axis:   35  Text Interpretation: Normal sinus rhythm Normal ECG When compared with ECG of 12-Nov-2023 12:01, No significant change was found Confirmed by Lucien Blanc [phone]) on 07/08/2024 9:32:27 AM   04/02/23 IMPRESSION: 1. Moderate CAD in the mid LAD, 50-69% stenosis, CADRADS 3. CT FFR will be performed and reported separately.   2. Total plaque volume 21 mm3 which is 72nd percentile for age- and sex-matched controls (calcified plaque 3 mm3; non-calcified plaque 18 mm3). TPV is mild.   3. Coronary calcium  score of 1. Calcium  scoring is not validated for percentiles in patients < age 59.   4. Normal coronary origins with right dominance.   RECOMMENDATIONS: CAD-RADS 3. Moderate stenosis. Consider symptom-guided anti-ischemic pharmacotherapy as well as risk factor modification per guideline directed care. Additional analysis with CT FFR will be submitted.  Physical Exam VS:  BP 114/68   Pulse 71   Ht 5' 5 (1.651 m)   Wt 196 lb (88.9 kg)   SpO2 98%   BMI 32.62 kg/m        Wt Readings from Last 3 Encounters:  07/08/24 196 lb (88.9 kg)  05/09/24 205 lb (93 kg)  11/12/23 210 lb (95.3 kg)    GEN: Well nourished, well developed in no acute distress NECK: No JVD; No carotid bruits CARDIAC: RRR, no murmurs, rubs, gallops RESPIRATORY:  Clear to auscultation without rales, wheezing or rhonchi  ABDOMEN: Soft, non-tender, non-distended EXTREMITIES:  No edema; No deformity   ASSESSMENT AND PLAN  Coronary artery disease of the LAD, status post moderate stenosis Moderate stenosis in the LAD with 50-69% blockage. FFR negative, no intervention needed. Repeat CT scan suggested to assess progression. Previous severe iodine contrast allergy noted. - Order repeat CT scan of the coronary arteries. -  Premedicate with Benadryl  prior to CT scan. - Investigate non-iodine contrast options.  Palpitations associated with menopausal symptoms and anxiety Palpitations likely due to menopausal symptoms and anxiety. Discussed impact of anxiety and stress on heart symptoms.  Morbid obesity, improved with significant weight loss Significant weight loss from 286 to 194 pounds. Lifestyle changes include improved diet and reduced intake of sodas and sweets. - Continued focus on maintaining healthy habits.      Dispo: She can return in 4-6 week to review results  Signed, Orren LOISE Fabry, PA-C

## 2024-07-08 ENCOUNTER — Ambulatory Visit: Attending: Physician Assistant | Admitting: Physician Assistant

## 2024-07-08 VITALS — BP 114/68 | HR 71 | Ht 65.0 in | Wt 196.0 lb

## 2024-07-08 DIAGNOSIS — R071 Chest pain on breathing: Secondary | ICD-10-CM

## 2024-07-08 DIAGNOSIS — R002 Palpitations: Secondary | ICD-10-CM | POA: Diagnosis not present

## 2024-07-08 DIAGNOSIS — I10 Essential (primary) hypertension: Secondary | ICD-10-CM

## 2024-07-08 DIAGNOSIS — K760 Fatty (change of) liver, not elsewhere classified: Secondary | ICD-10-CM | POA: Diagnosis not present

## 2024-07-08 MED ORDER — PREDNISONE 50 MG PO TABS: ORAL_TABLET | ORAL | 0 refills | Status: DC

## 2024-07-08 MED ORDER — NITROGLYCERIN 0.4 MG SL SUBL: 0.4000 mg | SUBLINGUAL_TABLET | SUBLINGUAL | 3 refills | Status: AC | PRN

## 2024-07-08 MED ORDER — METOPROLOL TARTRATE 100 MG PO TABS: 100.0000 mg | ORAL_TABLET | Freq: Once | ORAL | 0 refills | Status: DC

## 2024-07-08 NOTE — Patient Instructions (Addendum)
 Medication Instructions:   START TAKING: NITROGLYCERIN  AS NEEDED FOR CHEST PAIN   2. TWO HOURS PRIOR TO PROCEDURE  TAKE METOPROLOL  100 MG ONE DOSE   3. FOR CONTRAST ALLERGY MAKE SURE YOU FOLLOW :                         1.         Take  Prednisone  50 mg - take 13 hours prior to test   2. Take another Prednisone  50 mg 7 hours prior to test   3. Take another Prednisone  50 mg 1 hour prior to test   4. Take Benadryl  50 mg 1 hour prior to test     *If you need a refill on your cardiac medications before your next appointment, please call your pharmacy*  Lab Work:  PLEASE GO DOWN [facility]  FIRST FLOOR   ( GET OFF ELEVATORS WALK TOWARDS WAITING AREA LAB LOCATED BY PHARMACY): BMET AND CBC TODAY     If you have labs (blood work) drawn today and your tests are completely normal, you will receive your results only by: MyChart Message (if you have MyChart) OR A paper copy in the mail If you have any lab test that is abnormal or we need to change your treatment, we will call you to review the results.  Testing/Procedures: Non-Cardiac CT Angiography (CTA), is a special type of CT scan that uses a computer to produce multi-dimensional views of major blood vessels throughout the body. In CT angiography, a contrast material is injected through an IV to help visualize the blood vessels    Follow-Up: At [facility], you and your health needs are our priority.  As part of our continuing mission to provide you with exceptional heart care, our providers are all part of one team.  This team includes your primary Cardiologist (physician) and Advanced Practice Providers or APPs (Physician Assistants and Nurse Practitioners) who all work together to provide you with the care you need, when you need it.  Your next appointment:  ANY AVAILABLE APP    IN    4-6  week(s)  Provider:   One of our Advanced Practice Providers (APPs): Morse Clause, PA-C  Lamarr Satterfield, NP Miriam Shams,  NP  Olivia Pavy, PA-C Josefa Beauvais, NP  Leontine Salen, PA-C Orren Fabry, PA-C  [place], PA-C Ernest Dick, NP  Damien Braver, NP Jon Hails, PA-C  Waddell Donath, PA-C    Dayna Dunn, PA-C  Scott Weaver, PA-C Lum Louis, NP Katlyn West, NP Callie Goodrich, PA-C  Xika Zhao, NP Sheng Haley, PA-C    Kathleen Johnson, PA-C   We recommend signing up for the patient portal called MyChart.  Sign up information is provided on this After Visit Summary.  MyChart is used to connect with patients for Virtual Visits (Telemedicine).  Patients are able to view lab/test results, encounter notes, upcoming appointments, etc.  Non-urgent messages can be sent to your provider as well.   To learn more about what you can do with MyChart, go to [email or web address].   Other Instructions        Your cardiac CT will be scheduled at one of the below locations:   [facility] [address] [place], [place] [address] [phone] (Severe contrast allergies only)  OR   [facility] [address] [place], [place] [address] [phone]  OR   [facility] [address]  [address] [place], [place] [address] [phone]  OR   [facility] and V[facility] Tower [address]  [place], [place] [address] [phone]  OR   [facility] [address] [place], [place] [phone]  If scheduled at [facility], please arrive at the [facility] and Children's Entrance (Entrance C2) of [facility] 30 minutes prior to test start time. You can use the FREE valet parking offered at entrance C (encouraged to control the heart rate for the test)  Proceed to the [facility] Radiology Department (first floor) to check-in and test prep.  All radiology patients and guests should use entrance C2 at [facility], accessed from [facility], even though the hospital's physical address listed is [address]  If scheduled at the Heart and Vascular Tower at [address], please enter the parking lot using the [address] entrance and use the FREE valet service at the patient drop-off area. Enter the building and check-in with registration on the main floor.  If scheduled at [facility], please arrive to the Heart and Vascular Center 15 mins early for check-in and test prep.  There is spacious parking and easy access to the radiology department from the [facility] entrance. Please enter here and check-in with the desk attendant.   If scheduled at [facility], please arrive 30 minutes early for check-in and test prep.  Please follow these instructions carefully (unless otherwise directed):  An IV will be required for this test and Nitroglycerin  will be given.    On the Night Before the Test: Be sure to Drink plenty of water. Do not consume any caffeinated/decaffeinated beverages or chocolate 12 hours prior to your test. Do not take any antihistamines 12 hours prior to your test.  If the patient has contrast allergy: Patient will need a prescription for Prednisone  and very clear instructions (as follows): Prednisone  50 mg - take 13 hours prior to test Take another Prednisone  50 mg 7 hours prior to test Take another Prednisone  50 mg 1 hour prior to test Take Benadryl  50 mg 1 hour prior to test Patient must complete all four doses of above prophylactic medications. Patient will need a ride after test due to Benadryl .  On the Day of the Test: Drink plenty of water until 1 hour prior to the test. Do not eat any food 1 hour prior to test. You may take your regular medications prior to the test.  Take metoprolol  (Lopressor ) two hours prior to test. If you take Furosemide /Hydrochlorothiazide/Spironolactone/Chlorthalidone, please HOLD on the morning of the test. Patients who wear a continuous glucose monitor MUST remove the device prior to  scanning. FEMALES- please wear underwire-free bra if available, avoid dresses & tight clothing   After the Test: Drink plenty of water. After receiving IV contrast, you may experience a mild flushed feeling. This is normal. On occasion, you may experience a mild rash up to 24 hours after the test. This is not dangerous. If this occurs, you can take Benadryl  25 mg, Zyrtec, Claritin, or Allegra and increase your fluid intake. (Patients taking Tikosyn should avoid Benadryl , and may take Zyrtec, Claritin, or Allegra) If you experience trouble breathing, this can be serious. If it is severe call 911 IMMEDIATELY. If it is mild, please call our office.  We will call to schedule your test 2-4 weeks out understanding that some insurance companies will need an authorization prior to the service being  performed.   For more information and frequently asked questions, please visit our website : [email or web address]  For non-scheduling related questions, please contact the cardiac imaging nurse navigator should you have any questions/concerns: Cardiac Imaging Nurse Navigators Direct Office Dial: [phone]   For scheduling needs, including cancellations and rescheduling, please call [place], [phone].

## 2024-07-09 LAB — CBC
Hematocrit: 39.7 % (ref 34.0–46.6)
Hemoglobin: 13.5 g/dL (ref 11.1–15.9)
MCH: 31.6 pg (ref 26.6–33.0)
MCHC: 34 g/dL (ref 31.5–35.7)
MCV: 93 fL (ref 79–97)
Platelets: 184 x10E3/uL (ref 150–450)
RBC: 4.27 x10E6/uL (ref 3.77–5.28)
RDW: 13 % (ref 11.7–15.4)
WBC: 8.7 x10E3/uL (ref 3.4–10.8)

## 2024-07-09 LAB — BASIC METABOLIC PANEL WITH GFR
BUN/Creatinine Ratio: 11 (ref 9–23)
BUN: 8 mg/dL (ref 6–24)
CO2: 22 mmol/L (ref 20–29)
Calcium: 9.6 mg/dL (ref 8.7–10.2)
Chloride: 102 mmol/L (ref 96–106)
Creatinine, Ser: 0.76 mg/dL (ref 0.57–1.00)
Glucose: 82 mg/dL (ref 70–99)
Potassium: 4.4 mmol/L (ref 3.5–5.2)
Sodium: 140 mmol/L (ref 134–144)
eGFR: 98 mL/min/1.73 (ref >59)

## 2024-07-11 ENCOUNTER — Ambulatory Visit: Payer: Self-pay | Admitting: Physician Assistant

## 2024-07-14 ENCOUNTER — Telehealth (HOSPITAL_COMMUNITY): Payer: Self-pay | Admitting: Emergency Medicine

## 2024-07-14 NOTE — Telephone Encounter (Signed)
 Reaching out to patient to offer assistance regarding upcoming cardiac imaging study; pt verbalizes understanding of appt date/time, parking situation and where to check in, pre-test NPO status and medications ordered, and verified current allergies; name and call back number provided for further questions should they arise Rockwell Alexandria RN Navigator Cardiac Imaging [facility] [phone] office [phone] cell

## 2024-07-15 ENCOUNTER — Ambulatory Visit (HOSPITAL_COMMUNITY)
Admission: RE | Admit: 2024-07-15 | Discharge: 2024-07-15 | Disposition: A | Discharge status: Home / Self Care | Source: Ambulatory Visit | Attending: Physician Assistant | Admitting: Physician Assistant

## 2024-07-15 DIAGNOSIS — R071 Chest pain on breathing: Secondary | ICD-10-CM | POA: Insufficient documentation

## 2024-07-15 DIAGNOSIS — I2510 Atherosclerotic heart disease of native coronary artery without angina pectoris: Secondary | ICD-10-CM | POA: Insufficient documentation

## 2024-07-15 DIAGNOSIS — K229 Disease of esophagus, unspecified: Secondary | ICD-10-CM | POA: Diagnosis not present

## 2024-07-15 MED ORDER — NITROGLYCERIN 0.4 MG SL SUBL
0.8000 mg | SUBLINGUAL_TABLET | Freq: Once | SUBLINGUAL | Status: AC
  Administered 2024-07-15: 0.8 mg via SUBLINGUAL

## 2024-07-15 MED ORDER — IOHEXOL 350 MG/ML SOLN
100.0000 mL | Freq: Once | INTRAVENOUS | Status: AC | PRN
  Administered 2024-07-15: 100 mL via INTRAVENOUS

## 2024-08-05 NOTE — Progress Notes (Unsigned)
 Cardiology Office Note   Date:  08/05/2024  ID:  Kathy Conway, DOB [birth year], MRN [id number] PCP: Vicci Barnie NOVAK, MD  [facility] Providers Cardiologist:  Georganna Archer, MD   History of Present Illness Kathy Conway is a 48 y.o. female with a past medical history of palpitations.  She was seen last April with a complaint of palpitations for the last 2 months.  Lasting around 30 to 60 minutes and happening several times a week.  Was associated with dizziness but no passing out.  Can have sharp pains with the palpitations.  She denies exertional chest pain, dizziness, leg edema, nitroglycerin  use, orthopnea, PND, shortness of breath, and syncope.  For her job she has to lift heavy boxes.  This does not trigger any cardiac symptoms.  Ultimately, cardiac monitor was ordered at that visit.  She is here for her annual follow-up appointment. She has been experiencing intermittent sharp chest pain radiating to her shoulder, occurring at rest and with movement, often associated with anxiety and stress. She also has night sweats, anxiety, and palpitations, which she attributes to menopause.  Her coronary artery disease history includes a prior CT scan showing moderate disease in the LAD artery with 50-69% blockage. An FFR test was negative.  She has an iodine-mediated contrast allergy, previously causing a severe reaction with difficulty breathing and chest tightness. She will be premedicated before the test.   She has lost 136 pounds through dietary changes, reducing soda and sweets, increasing fruits and vegetables, and avoiding fried foods. She primarily consumes chicken and has reduced overall meat intake.  Reports no shortness of breath nor dyspnea on exertion.No edema, orthopnea, PND. Reports no palpitations.   Discussed the use of AI scribe software for clinical note transcription with the patient, who gave verbal consent to proceed.  Today. ****  ROS: pertinent ROS  in HPI  Studies Reviewed     04/02/23 IMPRESSION: 1. Moderate CAD in the mid LAD, 50-69% stenosis, CADRADS 3. CT FFR will be performed and reported separately.   2. Total plaque volume 21 mm3 which is 72nd percentile for age- and sex-matched controls (calcified plaque 3 mm3; non-calcified plaque 18 mm3). TPV is mild.   3. Coronary calcium  score of 1. Calcium  scoring is not validated for percentiles in patients < age 41.   4. Normal coronary origins with right dominance.   RECOMMENDATIONS: CAD-RADS 3. Moderate stenosis. Consider symptom-guided anti-ischemic pharmacotherapy as well as risk factor modification per guideline directed care. Additional analysis with CT FFR will be submitted.  Physical Exam VS:  There were no vitals taken for this visit.       Wt Readings from Last 3 Encounters:  07/08/24 196 lb (88.9 kg)  05/09/24 205 lb (93 kg)  11/12/23 210 lb (95.3 kg)    GEN: Well nourished, well developed in no acute distress NECK: No JVD; No carotid bruits CARDIAC: RRR, no murmurs, rubs, gallops RESPIRATORY:  Clear to auscultation without rales, wheezing or rhonchi  ABDOMEN: Soft, non-tender, non-distended EXTREMITIES:  No edema; No deformity   ASSESSMENT AND PLAN   Coronary artery disease of the LAD, status post moderate stenosis Moderate stenosis in the LAD with 50-69% blockage. FFR negative, no intervention needed. Repeat CT scan suggested to assess progression. Previous severe iodine contrast allergy noted. - Order repeat CT scan of the coronary arteries. - Premedicate with Benadryl  prior to CT scan. - Investigate non-iodine contrast options.  Palpitations associated with menopausal symptoms and anxiety Palpitations likely due  to menopausal symptoms and anxiety. Discussed impact of anxiety and stress on heart symptoms.  Morbid obesity, improved with significant weight loss Significant weight loss from 286 to 194 pounds. Lifestyle changes include improved diet  and reduced intake of sodas and sweets. - Continued focus on maintaining healthy habits.      Dispo: She can return in 4-6 week to review results  Signed, Orren LOISE Fabry, PA-C

## 2024-08-07 ENCOUNTER — Ambulatory Visit: Attending: Physician Assistant | Admitting: Physician Assistant

## 2024-08-07 ENCOUNTER — Encounter: Payer: Self-pay | Admitting: Physician Assistant

## 2024-08-07 VITALS — BP 100/72 | HR 67 | Ht 65.0 in | Wt 193.0 lb

## 2024-08-07 DIAGNOSIS — R071 Chest pain on breathing: Secondary | ICD-10-CM | POA: Diagnosis not present

## 2024-08-07 DIAGNOSIS — I10 Essential (primary) hypertension: Secondary | ICD-10-CM | POA: Insufficient documentation

## 2024-08-07 DIAGNOSIS — E785 Hyperlipidemia, unspecified: Secondary | ICD-10-CM | POA: Insufficient documentation

## 2024-08-07 MED ORDER — EZETIMIBE 10 MG PO TABS: 10.0000 mg | ORAL_TABLET | Freq: Every day | ORAL | 3 refills | Status: DC

## 2024-08-07 MED ORDER — LOSARTAN POTASSIUM 25 MG PO TABS: 25.0000 mg | ORAL_TABLET | Freq: Every day | ORAL | Status: AC

## 2024-08-07 NOTE — Patient Instructions (Addendum)
 Medication Instructions:  START Ezetimibe (Zetia) 10mg  daily  DECREASE Losartan  to 25mg  daily *If you need a refill on your cardiac medications before your next appointment, please call your pharmacy*  Lab Work: TODAY - LFT 3 months - LIPID panel  If you have labs (blood work) drawn today and your tests are completely normal, you will receive your results only by: MyChart Message (if you have MyChart) OR A paper copy in the mail If you have any lab test that is abnormal or we need to change your treatment, we will call you to review the results.  Testing/Procedures: None  Follow-Up: At [facility], you and your health needs are our priority.  As part of our continuing mission to provide you with exceptional heart care, our providers are all part of one team.  This team includes your primary Cardiologist (physician) and Advanced Practice Providers or APPs (Physician Assistants and Nurse Practitioners) who all work together to provide you with the care you need, when you need it.  Your next appointment:   4 month(s)  Provider:   Orren Fabry, PA-C   We recommend signing up for the patient portal called MyChart.  Sign up information is provided on this After Visit Summary.  MyChart is used to connect with patients for Virtual Visits (Telemedicine).  Patients are able to view lab/test results, encounter notes, upcoming appointments, etc.  Non-urgent messages can be sent to your provider as well.   To learn more about what you can do with MyChart, go to [email or web address].   Other Instructions: Please monitor your blood pressure on the BP log provided for you.   Your provider would like for you to reach out to your therapist.    Please check your blood pressure daily for two weeks, then contact the office with your readings  Please contact the office with your readings either by phone, by dropping it off in person, or by sending it through MyChart.   Be sure to check  your blood pressure two hours after taking your medications.  Avoid the following for 30 minutes before checking your blood pressure: No caffeine No alcohol No eating No smoking  No exercise  Five minutes before checking your blood pressure: Use the restroom Sit up straight in a chair with your back supported and feet flat on the floor Remain quiet and do not talk

## 2024-08-08 ENCOUNTER — Ambulatory Visit (HOSPITAL_COMMUNITY)
Admission: EM | Admit: 2024-08-08 | Discharge: 2024-08-08 | Disposition: A | Discharge status: Home / Self Care | Attending: Behavioral Health | Admitting: Behavioral Health

## 2024-08-08 DIAGNOSIS — F329 Major depressive disorder, single episode, unspecified: Secondary | ICD-10-CM | POA: Insufficient documentation

## 2024-08-08 DIAGNOSIS — G4700 Insomnia, unspecified: Secondary | ICD-10-CM | POA: Insufficient documentation

## 2024-08-08 DIAGNOSIS — F419 Anxiety disorder, unspecified: Secondary | ICD-10-CM | POA: Insufficient documentation

## 2024-08-08 LAB — HEPATIC FUNCTION PANEL
ALT: 13 IU/L (ref 0–32)
AST: 13 IU/L (ref 0–40)
Albumin: 4.5 g/dL (ref 3.9–4.9)
Alkaline Phosphatase: 63 IU/L (ref 41–116)
Bilirubin Total: 0.7 mg/dL (ref 0.0–1.2)
Bilirubin, Direct: 0.22 mg/dL (ref 0.00–0.40)
Total Protein: 6.6 g/dL (ref 6.0–8.5)
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
## 2024-08-08 NOTE — ED Notes (Signed)

## 2024-08-08 NOTE — ED Provider Notes (Signed)
 Behavioral Health Urgent Care Medical Screening Exam  Patient Name: Kathy Conway MRN: [id number] Date of Evaluation: 08/08/24 Chief Complaint:  Per Triage, Pt Kathy Conway is a 60Y female presenting to [facility] as a vol walk-in. Pt states she does not know how much longer she can take before she snaps. Pt has been worsening depression and anxiety due to stress at home. Pt staes that she can not sleep due to the stress she is having. Pt states that she currently has heart, liver, and colon disease. These diseases have casued her feels like she is not enough and can not enjoy anything. Pt has a hx of bipolar, Schizoaffective, and Schizophrenia. Pt denies SI, HI, and substance use, Pt endorses AVH as she hears voices and sees figures. Pt states she has been self medicating with delta 9. Pt is seeking therapy services and medication.  Diagnosis:  Final diagnoses:  Anxiety and depression    History of Present illness: Kathy Conway is a 48 y.o. female patient with a reported psychiatric history significant for bipolar, depression, schizophrenia schizoaffective disorder, borderline personality disorder, and anxiety and a reported medical history of heart disease, hypertension, colon disease, and nonalcoholic fatty liver disease who presented to the [facility] voluntary with complaints of worsening depression and anxiety.  Patient reports worsening depression and anxiety for over a year. She endorses depressive symptoms of sadness, decreased energy, decreased motivation, anger, irritability, decreased sleep, and poor appetite. She endorses anxiety symptoms of feeling nauseated, overthinking and shaky. She identifies current stressors as having to live with her sister who is crazy, attending school at [employer] to get her high school diploma and having to help take care of her niece who is autistic. She also states that she had to quit her job in June (2025) due to health  reasons with having heart disease. She denies outpatient psychiatry or therapy at this time. She denies taking psychotropic medications. She states that in the past she was prescribed Seroquel, Cymbalta  and Depakote. She states that she has been off psychotropic medications for 2 years. She states that she was previously following up with [facility]. She reports one inpatient psychiatric hospitalization at [facility] when she was 48 years old and overdosed on Depakote.  She denies suicidal thoughts. She reports three past suicide attempts, age 19 she tried to slit her wrist, age 50 she walked in front of a car, and age 22 she overdosed on Depakote. She denies homicidal thoughts. She endorses auditory hallucinations that she describes as hearing mumbling and states that she is not sure if it is her own thoughts. She reports seeing shadows. She states that she has been hearing voices and seeing things her whole life because she has schizophrenia. Objectively, no signs of acute psychosis on exam.  She resides with her sister, niece and her husband. She is unemployed. She reports using delta 9 at night to help her sleep. She denies drinking alcohol. She denies legal issues.  Plan of care: I discussed with the patient treatment options which included inpatient psychiatric treatment for mood stabilization, counseling, and medication management and outpatient psychiatry for medication management and counseling. Patient declined inpatient psychiatric treatment and states that she does not like to be confined. She states that her goal for coming in today was to get back on medications to help with her anxiety and depression. She is interested in outpatient psychiatry and therapy. Will provide patient with outpatient resources for psychiatry and therapy. Safety  planning completed prior to discharge. Patient does not appear to be at imminent risk of dangerousness to self or others at this time. While future psychiatric events  cannot be accurately predicted, the patient does not desire acute inpatient psychiatric care at this time. The patient does not meet [place]  involuntary commitment criteria at this time.    Flowsheet Row ED from 08/08/2024 in [facility] UC from 07/06/2024 in [facility] at [facility] ED from 11/12/2023 in [facility] at [facility]  C-SSRS RISK CATEGORY No Risk No Risk No Risk    Psychiatric Specialty Exam  Presentation  General Appearance:Appropriate for Environment  Eye Contact:Fair  Speech:Clear and Coherent  Speech Volume:Normal  Handedness:Right   Mood and Affect  Mood: Anxious; Depressed  Affect: Congruent   Thought Process  Thought Processes: Coherent; Goal Directed  Descriptions of Associations:Intact  Orientation:Full (Time, Place and Person)  Thought Content:Logical    Hallucinations:None  Ideas of Reference:None  Suicidal Thoughts:No  Homicidal Thoughts:No   Sensorium  Memory: Recent Fair; Remote Fair; Immediate Fair  Judgment: Fair  Insight: Fair   Art Therapist  Concentration: Fair  Attention Span: Fair  Recall: [employer] of Knowledge: Fair  Language: Fair   Psychomotor Activity  Psychomotor Activity: Normal   Assets  Assets: Manufacturing Systems Engineer; Desire for Improvement; Housing; Health And Safety Inspector; Intimacy; Social Support   Sleep  Sleep: Poor  Number of hours:  5   Physical Exam: Physical Exam Cardiovascular:     Rate and Rhythm: Normal rate.  Pulmonary:     Effort: Pulmonary effort is normal.  Musculoskeletal:        General: Normal range of motion.     Cervical back: Normal range of motion.  Neurological:     Mental Status: She is alert and oriented to person, place, and time.    Review of Systems  Constitutional: Negative.   HENT: Negative.    Eyes: Negative.   Respiratory:  Negative.    Cardiovascular: Negative.   Gastrointestinal: Negative.   Genitourinary: Negative.   Musculoskeletal: Negative.   Neurological: Negative.   Psychiatric/Behavioral:  Positive for depression. The patient is nervous/anxious and has insomnia.    Blood pressure 129/89, pulse (!) 102, temperature 98.4 F (36.9 C), temperature source Oral, resp. rate 18, SpO2 99%. There is no height or weight on file to calculate BMI.  Musculoskeletal: Strength & Muscle Tone: within normal limits Gait & Station: normal Patient leans: N/A   [facility] MSE Discharge Disposition for Follow up and Recommendations: Based on my evaluation the patient does not appear to have an emergency medical condition and can be discharged with resources and follow up care in outpatient services for Medication Management and Individual Therapy  Discharge recommendations:   Outpatient Follow up: Please review list of outpatient resources for psychiatry and counseling. Please follow up with your primary care provider for all medical related needs.   Please contact one of the following facilities to start medication management and therapy services:   [facility] at [facility] [address] (SECOND FLOOR)  (please arrive a 7 AM) [place], [place]  [address] Phone: [phone]  [facility]  [address] [place], [place] [address] Phone: [phone]  [facility]  [address]  [place], [place] [address]  [facility]  [address]  [place], [place] [address] Phone: [phone]  Therapy: We recommend that  patient participate in individual therapy to address mental health concerns.  [facility] Located in: [place] building Address: [address], [place], [place] [address] Phone: [phone]  Safety:   The following safety precautions should be taken:   No sharp objects. This includes scissors, razors, scrapers, and  putty knives.   Chemicals should be removed and locked up.   Medications should be removed and locked up.   Weapons should be removed and locked up. This includes firearms, knives and instruments that can be used to cause injury.   The patient should abstain from use of illicit substances/drugs and abuse of any medications.  If symptoms worsen or do not continue to improve or if the patient becomes actively suicidal or homicidal then it is recommended that the patient return to the closest hospital emergency department, the [facility], or call 911 for further evaluation and treatment. National Suicide Prevention Lifeline 1-800-SUICIDE or 973-369-5762.  About 988 988 offers 24/7 access to trained crisis counselors who can help people experiencing mental health-related distress. People can call or text 988 or chat 988lifeline.org for themselves or if they are worried about a loved one who may need crisis support.    Sophie Quiles L, NP 08/08/2024, 1:08 PM

## 2024-08-08 NOTE — Discharge Instructions (Addendum)
 Discharge recommendations:   Outpatient Follow up: Please review list of outpatient resources for psychiatry and counseling. Please follow up with your primary care provider for all medical related needs.   Please contact one of the following facilities to start medication management and therapy services:   [facility] at [facility] [address] (SECOND FLOOR)  (please arrive a 7 AM) [place], [place]  [address] Phone: [phone]  [facility]  [address] [place], [place] [address] Phone: [phone]  [facility]  [address]  [place], [place] [address]  [facility]  [address]  [place], [place] [address] Phone: [phone]  Therapy: We recommend that patient participate in individual therapy to address mental health concerns.  [facility] Located in: [place] building Address: [address], [place], [place] [address] Phone: [phone]  Safety:   The following safety precautions should be taken:   No sharp objects. This includes scissors, razors, scrapers, and putty knives.   Chemicals should be removed and locked up.   Medications should be removed and locked up.   Weapons should be removed and locked up. This includes firearms, knives and instruments that can be used to cause injury.   The patient should abstain from use of illicit substances/drugs and abuse of any medications.  If symptoms worsen or do not continue to improve or if the patient becomes actively suicidal or homicidal then it is recommended that the patient return to the closest hospital emergency department, the [facility], or call 911 for further evaluation and treatment. National Suicide Prevention Lifeline 1-800-SUICIDE or (930)153-1567.  About 988 988 offers 24/7 access to trained crisis counselors who can help people experiencing mental health-related distress. People  can call or text 988 or chat 988lifeline.org for themselves or if they are worried about a loved one who may need crisis support.

## 2024-08-11 ENCOUNTER — Ambulatory Visit: Payer: Self-pay | Admitting: Physician Assistant

## 2024-10-24 ENCOUNTER — Encounter (HOSPITAL_COMMUNITY): Payer: Self-pay

## 2024-10-24 ENCOUNTER — Emergency Department (HOSPITAL_COMMUNITY)

## 2024-10-24 ENCOUNTER — Emergency Department (HOSPITAL_COMMUNITY)
Admission: EM | Admit: 2024-10-24 | Discharge: 2024-10-24 | Disposition: A | Discharge status: Home / Self Care | Attending: Emergency Medicine | Admitting: Emergency Medicine

## 2024-10-24 ENCOUNTER — Telehealth: Payer: Self-pay | Admitting: Physician Assistant

## 2024-10-24 DIAGNOSIS — R0789 Other chest pain: Secondary | ICD-10-CM | POA: Diagnosis present

## 2024-10-24 DIAGNOSIS — J45909 Unspecified asthma, uncomplicated: Secondary | ICD-10-CM | POA: Diagnosis not present

## 2024-10-24 DIAGNOSIS — I2510 Atherosclerotic heart disease of native coronary artery without angina pectoris: Secondary | ICD-10-CM | POA: Diagnosis not present

## 2024-10-24 DIAGNOSIS — R079 Chest pain, unspecified: Secondary | ICD-10-CM

## 2024-10-24 LAB — CBC
HCT: 36.4 % (ref 36.0–46.0)
Hemoglobin: 12.7 g/dL (ref 12.0–15.0)
MCH: 32.6 pg (ref 26.0–34.0)
MCHC: 34.9 g/dL (ref 30.0–36.0)
MCV: 93.6 fL (ref 80.0–100.0)
Platelets: 142 K/uL — ABNORMAL LOW (ref 150–400)
RBC: 3.89 MIL/uL (ref 3.87–5.11)
RDW: 12.9 % (ref 11.5–15.5)
WBC: 7.2 K/uL (ref 4.0–10.5)
nRBC: 0 % (ref 0.0–0.2)

## 2024-10-24 LAB — TROPONIN T, HIGH SENSITIVITY
Troponin T High Sensitivity: <15 ng/L (ref 0–19)
Troponin T High Sensitivity: <15 ng/L (ref 0–19)

## 2024-10-24 LAB — BASIC METABOLIC PANEL WITH GFR
Anion gap: 9 (ref 5–15)
BUN: 17 mg/dL (ref 6–20)
CO2: 24 mmol/L (ref 22–32)
Calcium: 9.6 mg/dL (ref 8.9–10.3)
Chloride: 105 mmol/L (ref 98–111)
Creatinine, Ser: 0.7 mg/dL (ref 0.44–1.00)
GFR, Estimated: >60 mL/min
Glucose, Bld: 96 mg/dL (ref 70–99)
Potassium: 4.4 mmol/L (ref 3.5–5.1)
Sodium: 138 mmol/L (ref 135–145)

## 2024-10-24 MED ORDER — KETOROLAC TROMETHAMINE 30 MG/ML IJ SOLN
30.0000 mg | Freq: Once | INTRAMUSCULAR | Status: AC
  Administered 2024-10-24: 30 mg via INTRAMUSCULAR
  Filled 2024-10-24: qty 1

## 2024-10-24 MED ORDER — ACETAMINOPHEN 500 MG PO TABS
1000.0000 mg | ORAL_TABLET | Freq: Once | ORAL | Status: AC
  Administered 2024-10-24: 1000 mg via ORAL
  Filled 2024-10-24: qty 2

## 2024-10-24 NOTE — Telephone Encounter (Signed)
" ° °  Pt c/o of Chest Pain: STAT if active CP, including tightness, pressure, jaw pain, radiating pain to shoulder/upper arm/back, CP unrelieved by Nitro. Symptoms reported of SOB, nausea, vomiting, sweating.  1. Are you having CP right now? Yes, mild     2. Are you experiencing any other symptoms (ex. SOB, nausea, vomiting, sweating)? Nausea, sweating, pain down left arm    3. Is your CP continuous or coming and going? Coming and going    4. Have you taken Nitroglycerin ? Yes, 3    5. How long have you been experiencing CP? Since Wednesday     6. If NO CP at time of call then end call with telling Pt to call back or call 911 if Chest pain returns prior to return call from triage team.   Last night  139/90 BP  141/84 BP  This morning  100/70 BP "

## 2024-10-24 NOTE — ED Triage Notes (Signed)
 Pt reports intermittent left sided chest pain for the past 3 days, chest pain increased last night, took 3 nitroglycerins with some improvement. Pain back this morning, called cardiologist and was recommended to come to ER.

## 2024-10-24 NOTE — Telephone Encounter (Signed)
 Spoke with patient regarding chest pain that started Wednesday. Nausea, headaches, chest pain upon awakening. Feeling lethargic, nausea, and pain radiating down her L arm and in between her shoulder blades. Nitroglycerin  x3 taken and another dose of Losartan  taken yesterday with no relief. Constant chest pain this morning and requesting advice. Advised patient that she needed to go to the Emergency Room to be evaluated. Patient agreed and husband is going to take her. Will update Orren Fabry, PA-C. Pt also asking about paperwork for disability regarding heart issues. Requested patient reach back out to us  once she gets check out in the ED. Verbalizes understanding of plan.

## 2024-10-24 NOTE — ED Provider Notes (Signed)
 " [facility]GENC[facility] DEPARTMENT AT [facility] Provider Note  CSN: [id number] Arrival date & time: 10/24/24 1013  Chief Complaint(s) Chest Pain  HPI Kathy Conway is a 49 y.o. female here today for multiple complaints.  She reports that over the last 3 days she has started to have some pain on the left side of her chest, as well as some numbness on the left side of her chest.  She also reports that her blood pressure has been running high recently, stating that at home she had a blood pressure of 140/90.  She states that while she has been here, she also felt as though her vision has been shaky.   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   Class 3 severe obesity due to excess calories without serious comorbidity with body mass index (BMI) of 40.0 to 44.9 in adult (HCC) 08/06/2017   History of gestational diabetes 04/17/2017   Bilateral hearing loss 03/24/2017   Acute pain of right foot 03/24/2017   Insomnia 03/05/2017   Plantar fasciitis, left 10/23/2016   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 Authorizing Provider  cyclobenzaprine  (FLEXERIL ) 5 MG tablet Take 5  mg by mouth 3 (three) times daily. 07/18/24   [provider]  diclofenac  (VOLTAREN ) 50 MG EC tablet Take 1 tablet (50 mg total) by mouth 2 (two) times daily. 07/06/24   Reddick, Johnathan B, NP  ezetimibe  (ZETIA ) 10 MG tablet Take 1 tablet (10 mg total) by mouth daily. 08/07/24 11/05/24  Lucien Orren SAILOR, PA-C  losartan  (COZAAR ) 25 MG tablet Take 1 tablet (25 mg total) by mouth daily. 08/07/24   Conte, Tessa N, PA-C  nitroGLYCERIN  (NITROSTAT ) 0.4 MG SL tablet Place 1 tablet (0.4 mg total) under the tongue every 5 (five) minutes as needed for chest pain. 07/08/24   Lucien Orren SAILOR, PA-C  rosuvastatin  (CRESTOR ) 20 MG tablet Take 1 tablet (20 mg total) by mouth daily. 05/09/24   Vicci Barnie NOVAK, MD  DULoxetine  (CYMBALTA ) 30 MG capsule Take 1 capsule (30 mg total) by mouth daily. Patient not taking: Reported on 05/23/2018 12/06/17 10/21/20  Vicci Barnie NOVAK, MD  Past Surgical History Past Surgical History:  Procedure Laterality Date   ABDOMINAL HYSTERECTOMY     has bilateral ovaries   CHOLECYSTECTOMY     TUBAL LIGATION     Family History Family History  Problem Relation Age of Onset   Brain cancer Mother    Cancer Father        hip   Cancer Sister        HPV   Heart failure Maternal Grandfather    Diabetes Paternal Aunt    Esophageal cancer Neg Hx     Social History Social History[1] Allergies Codeine, Iodinated contrast media, Gabapentin , Iodine, and Latex  Review of Systems Review of Systems  Physical Exam Vital Signs  I have reviewed the triage vital signs BP 124/74   Pulse (!) 56   Temp 98.6 F (37 C) (Oral)   Resp 10   SpO2 99%   Physical Exam Vitals and nursing note reviewed.  Constitutional:      Appearance: She is not toxic-appearing.  Cardiovascular:     Rate and Rhythm: Normal rate.     Heart sounds: Normal heart sounds. No  murmur heard.    Comments: 2+ radial pulses bilaterally Pulmonary:     Breath sounds: Normal breath sounds. No wheezing.  Musculoskeletal:        General: Normal range of motion.  Neurological:     General: No focal deficit present.     Mental Status: She is alert.     Cranial Nerves: No cranial nerve deficit.     Comments: 5-5 grip strength bilaterally.  No reported numbness in either hands.  Normal gait.     ED Results and Treatments Labs (all labs ordered are listed, but only abnormal results are displayed) Labs Reviewed  CBC - Abnormal; Notable for the following components:      Result Value   Platelets 142 (*)    All other components within normal limits  BASIC METABOLIC PANEL WITH GFR  TROPONIN T, HIGH SENSITIVITY  TROPONIN T, HIGH SENSITIVITY                                                                                                                          Radiology DG Chest 2 View Result Date: 10/24/2024 EXAM: PA AND LATERAL (2) VIEW(S) XRAY OF THE CHEST 10/24/2024 10:46:00 AM COMPARISON: PA and lateral radiographs of the chest dated 03/05/2023. CLINICAL HISTORY: chest pain chest pain chest pain FINDINGS: LUNGS AND PLEURA: No focal pulmonary opacity. No pleural effusion. No pneumothorax. HEART AND MEDIASTINUM: No acute abnormality of the cardiac and mediastinal silhouettes. BONES AND SOFT TISSUES: No acute osseous abnormality. IMPRESSION: 1. No acute cardiopulmonary pathology. Electronically signed by: Evalene Coho MD 10/24/2024 11:06 AM EST RP Workstation: [id number]    Pertinent labs & imaging results that were available during my care of the patient were reviewed by me and considered in my medical decision making (see MDM for details).  Medications Ordered in ED Medications  acetaminophen  (TYLENOL ) tablet 1,000 mg (has no administration in time range)  ketorolac  (TORADOL ) 30 MG/ML injection 30 mg (has no administration in time range)                                                                                                                                      Procedures Procedures  (including critical care time)  Medical Decision Making / ED Course   This patient presents to the ED for concern of chest pain, high blood pressure, numbness in the chest, tingling in the arm intermittent visual disturbances., this involves an extensive number of treatment options, and is a complaint that carries with it a high risk of complications and morbidity.  The differential diagnosis includes ACS, less likely dissection, less likely CVA, less likely pneumonia, consider electrolyte abnormalities.  MDM: Patient exam is benign.  Her EKG per my independent review shows no pneumonia.  Her constellation of symptoms do not align with an acute emergent process.  Discussed with the patient overall her exam and workup thus far is reassuring.  Her initial EKG is nonischemic, initial troponin not detectable.  Will obtain a second troponin.  Reassessment 3:10 PM-patient second troponin negative.  I provided her some Toradol  and Tylenol  for her headache.  Will discharge.   Additional history obtained:  -External records from outside source obtained and reviewed including: Chart review including previous notes, labs, imaging, consultation notes   Lab Tests: -I ordered, reviewed, and interpreted labs.   The pertinent results include:   Labs Reviewed  CBC - Abnormal; Notable for the following components:      Result Value   Platelets 142 (*)    All other components within normal limits  BASIC METABOLIC PANEL WITH GFR  TROPONIN T, HIGH SENSITIVITY  TROPONIN T, HIGH SENSITIVITY      EKG my independent review of the patient's EKG shows no ST segment depressions or elevations, no T wave inversions, no evidence of acute ischemia.  EKG Interpretation Date/Time:  Friday October 24 2024 10:28:44 EST Ventricular Rate:  65 PR Interval:  164 QRS Duration:  86 QT  Interval:  406 QTC Calculation: 422 R Axis:   72  Text Interpretation: Normal sinus rhythm Normal ECG When compared with ECG of 08-Jul-2024 08:25, PREVIOUS ECG IS PRESENT Confirmed by Mannie Pac [phone]) on 10/24/2024 11:13:54 AM         Imaging Studies ordered: I ordered imaging studies including chest x-ray I independently visualized and interpreted imaging. I agree with the radiologist interpretation   Medicines ordered and prescription drug management: Meds ordered this encounter  Medications   acetaminophen  (TYLENOL ) tablet 1,000 mg   ketorolac  (TORADOL ) 30 MG/ML injection 30 mg    -I have reviewed the patients home medicines and have made adjustments as needed    Cardiac Monitoring: The patient was maintained on a cardiac monitor.  I personally viewed and interpreted the cardiac  monitored which showed an underlying rhythm of: Normal sinus rhythm  Social Determinants of Health:  Factors impacting patients care include: Lack of access to primary care   Reevaluation: After the interventions noted above, I reevaluated the patient and found that they have :improved  Co morbidities that complicate the patient evaluation  Past Medical History:  Diagnosis Date   Arthritis    Asthma 10/1993   CAD (coronary artery disease)    Fatty liver    Fibromyalgia    H/O degenerative disc disease    IBS (irritable bowel syndrome) 10/2003   Migraine 10/1996   Renal disorder    Sciatica    Vertigo 10/2009      Dispostion: I considered admission for this patient, however with her low risk factors, heart score of 1, she is appropriate for discharge.     Final Clinical Impression(s) / ED Diagnoses Final diagnoses:  Chest pain, unspecified type     @PCDICTATION @     [1]  Social History Tobacco Use   Smoking status: Former    Current packs/day: 0.50    Types: Cigarettes    Passive exposure: Current   Smokeless tobacco: Never  Vaping Use   Vaping status:  Never Used  Substance Use Topics   Alcohol use: Not Currently    Alcohol/week: 2.0 standard drinks of alcohol    Types: 2 Shots of liquor per week    Comment: once a month   Drug use: No     Mannie Pac T, DO 10/24/24 1516  "

## 2024-10-24 NOTE — Discharge Instructions (Signed)
 While you were in the emergency room, you had blood work done that overall was normal.  Your EKG was normal.  Like we discussed, it is unlikely to be related to your heart but it is a good idea to follow-up with your regular doctor.  Return to the emergency room for any new or worsening symptoms.

## 2024-11-07 LAB — LIPID PANEL
Chol/HDL Ratio: 3.1 ratio (ref 0.0–4.4)
Cholesterol, Total: 159 mg/dL (ref 100–199)
HDL: 51 mg/dL
LDL Chol Calc (NIH): 92 mg/dL (ref 0–99)
Triglycerides: 83 mg/dL (ref 0–149)
VLDL Cholesterol Cal: 16 mg/dL (ref 5–40)

## 2024-11-10 ENCOUNTER — Ambulatory Visit: Admitting: Internal Medicine

## 2024-11-11 ENCOUNTER — Other Ambulatory Visit: Payer: Self-pay | Admitting: Internal Medicine

## 2024-11-12 ENCOUNTER — Other Ambulatory Visit: Payer: Self-pay

## 2024-11-12 MED ORDER — EZETIMIBE 10 MG PO TABS: 10.0000 mg | ORAL_TABLET | Freq: Every day | ORAL | 0 refills | Status: AC

## 2024-11-12 NOTE — Telephone Encounter (Signed)
 Rx(s) sent to pharmacy electronically.

## 2024-12-02 ENCOUNTER — Ambulatory Visit: Admitting: Physician Assistant
